# Patient Record
Sex: Female | Born: 1970 | Race: White | Hispanic: No | Marital: Married | Smoking: Former smoker
Health system: Southern US, Community
[De-identification: ages and names within clinical notes are randomized; demographics above are authoritative.]

## PROBLEM LIST (undated history)

## (undated) DIAGNOSIS — K589 Irritable bowel syndrome without diarrhea: Secondary | ICD-10-CM

## (undated) DIAGNOSIS — L309 Dermatitis, unspecified: Secondary | ICD-10-CM

## (undated) DIAGNOSIS — R519 Headache, unspecified: Secondary | ICD-10-CM

## (undated) DIAGNOSIS — R51 Headache: Secondary | ICD-10-CM

## (undated) DIAGNOSIS — G8929 Other chronic pain: Secondary | ICD-10-CM

## (undated) DIAGNOSIS — I10 Essential (primary) hypertension: Secondary | ICD-10-CM

## (undated) DIAGNOSIS — M797 Fibromyalgia: Secondary | ICD-10-CM

## (undated) HISTORY — DX: Other chronic pain: G89.29

## (undated) HISTORY — DX: Fibromyalgia: M79.7

## (undated) HISTORY — DX: Essential (primary) hypertension: I10

## (undated) HISTORY — DX: Headache, unspecified: R51.9

## (undated) HISTORY — DX: Irritable bowel syndrome, unspecified: K58.9

## (undated) HISTORY — DX: Headache: R51

## (undated) HISTORY — DX: Dermatitis, unspecified: L30.9

---

## 2005-04-20 ENCOUNTER — Ambulatory Visit (HOSPITAL_COMMUNITY): Admission: RE | Admit: 2005-04-20 | Discharge: 2005-04-20 | Payer: Self-pay | Admitting: Infectious Diseases

## 2006-05-25 ENCOUNTER — Ambulatory Visit (HOSPITAL_COMMUNITY): Admission: RE | Admit: 2006-05-25 | Discharge: 2006-05-25 | Payer: Self-pay | Admitting: Obstetrics & Gynecology

## 2006-05-28 ENCOUNTER — Inpatient Hospital Stay (HOSPITAL_COMMUNITY): Admission: AD | Admit: 2006-05-28 | Discharge: 2006-05-28 | Payer: Self-pay | Admitting: Obstetrics

## 2006-07-30 ENCOUNTER — Ambulatory Visit (HOSPITAL_COMMUNITY): Admission: RE | Admit: 2006-07-30 | Discharge: 2006-07-30 | Payer: Self-pay | Admitting: Obstetrics & Gynecology

## 2006-11-01 ENCOUNTER — Ambulatory Visit (HOSPITAL_COMMUNITY): Admission: RE | Admit: 2006-11-01 | Discharge: 2006-11-01 | Payer: Self-pay | Admitting: Obstetrics & Gynecology

## 2007-01-22 ENCOUNTER — Ambulatory Visit (HOSPITAL_COMMUNITY): Admission: RE | Admit: 2007-01-22 | Discharge: 2007-01-22 | Payer: Self-pay | Admitting: Obstetrics & Gynecology

## 2007-06-21 ENCOUNTER — Inpatient Hospital Stay (HOSPITAL_COMMUNITY): Admission: AD | Admit: 2007-06-21 | Discharge: 2007-06-23 | Payer: Self-pay | Admitting: Obstetrics & Gynecology

## 2009-12-21 LAB — HM PAP SMEAR: HM Pap smear: NORMAL

## 2010-12-19 ENCOUNTER — Encounter: Payer: Self-pay | Admitting: Family Medicine

## 2011-02-09 LAB — CBC
HCT: 36.3
HCT: 41.1
Hemoglobin: 12.5
Hemoglobin: 14.1
MCHC: 34.3
MCHC: 34.4
MCV: 87.9
MCV: 88.3
Platelets: 195
Platelets: 225
RBC: 4.11
RBC: 4.68
RDW: 13.7
RDW: 13.7
WBC: 15.4 — ABNORMAL HIGH
WBC: 20.8 — ABNORMAL HIGH

## 2011-02-09 LAB — RPR: RPR Ser Ql: NONREACTIVE

## 2012-04-10 ENCOUNTER — Encounter: Payer: Self-pay | Admitting: Family Medicine

## 2012-07-06 ENCOUNTER — Other Ambulatory Visit: Payer: Self-pay

## 2012-11-21 ENCOUNTER — Encounter: Payer: Self-pay | Admitting: Family Medicine

## 2013-02-12 ENCOUNTER — Encounter: Payer: Self-pay | Admitting: Family Medicine

## 2013-02-13 ENCOUNTER — Other Ambulatory Visit: Payer: Self-pay | Admitting: Family Medicine

## 2013-02-13 MED ORDER — SUMATRIPTAN-NAPROXEN SODIUM 85-500 MG PO TABS: 1.0000 | ORAL_TABLET | ORAL | Status: DC | PRN

## 2013-02-13 MED ORDER — METHOCARBAMOL 750 MG PO TABS: 750.0000 mg | ORAL_TABLET | Freq: Three times a day (TID) | ORAL | Status: DC

## 2013-02-13 NOTE — Telephone Encounter (Signed)
.  Rx Refilled  - OK's by [facility]

## 2013-03-07 ENCOUNTER — Encounter: Payer: Self-pay | Admitting: Family Medicine

## 2013-03-27 ENCOUNTER — Other Ambulatory Visit: Payer: Self-pay

## 2013-04-08 ENCOUNTER — Encounter: Payer: Self-pay | Admitting: Family Medicine

## 2013-04-09 ENCOUNTER — Other Ambulatory Visit: Payer: Self-pay | Admitting: Family Medicine

## 2013-04-09 MED ORDER — TRAMADOL HCL 50 MG PO TABS: 50.0000 mg | ORAL_TABLET | Freq: Four times a day (QID) | ORAL | Status: DC | PRN

## 2013-04-09 NOTE — Telephone Encounter (Signed)
rx was printed and faxed to pharmacy 

## 2013-04-29 ENCOUNTER — Other Ambulatory Visit: Payer: 59

## 2013-04-29 DIAGNOSIS — Z79899 Other long term (current) drug therapy: Secondary | ICD-10-CM

## 2013-04-29 DIAGNOSIS — Z0000 Encounter for general adult medical examination without abnormal findings: Secondary | ICD-10-CM

## 2013-04-29 LAB — CBC WITH DIFFERENTIAL/PLATELET
Basophils Relative: 1 % (ref 0–1)
Eosinophils Absolute: 0.2 10*3/uL (ref 0.0–0.7)
Eosinophils Relative: 2 % (ref 0–5)
HCT: 39.7 % (ref 36.0–46.0)
Hemoglobin: 13.6 g/dL (ref 12.0–15.0)
Lymphocytes Relative: 38 % (ref 12–46)
Lymphs Abs: 3.4 10*3/uL (ref 0.7–4.0)
MCH: 29.2 pg (ref 26.0–34.0)
MCHC: 34.3 g/dL (ref 30.0–36.0)
Monocytes Absolute: 0.5 10*3/uL (ref 0.1–1.0)
Monocytes Relative: 6 % (ref 3–12)
Neutrophils Relative %: 53 % (ref 43–77)
RBC: 4.65 MIL/uL (ref 3.87–5.11)
WBC: 8.8 10*3/uL (ref 4.0–10.5)

## 2013-04-29 LAB — COMPLETE METABOLIC PANEL WITH GFR
AST: 20 U/L (ref 0–37)
Alkaline Phosphatase: 43 U/L (ref 39–117)
BUN: 14 mg/dL (ref 6–23)
Calcium: 9.2 mg/dL (ref 8.4–10.5)
Chloride: 106 mEq/L (ref 96–112)
Creat: 0.75 mg/dL (ref 0.50–1.10)
GFR, Est African American: >89 mL/min
GFR, Est Non African American: >89 mL/min
Glucose, Bld: 88 mg/dL (ref 70–99)
Potassium: 4.9 mEq/L (ref 3.5–5.3)
Sodium: 141 mEq/L (ref 135–145)
Total Bilirubin: 0.5 mg/dL (ref 0.3–1.2)
Total Protein: 6.4 g/dL (ref 6.0–8.3)

## 2013-04-29 LAB — LIPID PANEL
HDL: 59 mg/dL (ref >39)
LDL Cholesterol: 50 mg/dL (ref 0–99)
Triglycerides: 108 mg/dL (ref <150)
VLDL: 22 mg/dL (ref 0–40)

## 2013-04-30 LAB — VITAMIN D 25 HYDROXY (VIT D DEFICIENCY, FRACTURES): Vit D, 25-Hydroxy: 43 ng/mL (ref 30–89)

## 2013-05-05 ENCOUNTER — Encounter: Payer: Self-pay | Admitting: Family Medicine

## 2013-05-06 ENCOUNTER — Encounter: Payer: Self-pay | Admitting: Family Medicine

## 2013-05-06 ENCOUNTER — Ambulatory Visit (INDEPENDENT_AMBULATORY_CARE_PROVIDER_SITE_OTHER): Payer: 59 | Admitting: Family Medicine

## 2013-05-06 VITALS — BP 130/90 | HR 92 | Temp 98.4°F | Resp 16 | Ht 62.0 in | Wt <= 1120 oz

## 2013-05-06 DIAGNOSIS — I10 Essential (primary) hypertension: Secondary | ICD-10-CM | POA: Insufficient documentation

## 2013-05-06 DIAGNOSIS — Z0000 Encounter for general adult medical examination without abnormal findings: Secondary | ICD-10-CM

## 2013-05-06 MED ORDER — METOPROLOL SUCCINATE ER 25 MG PO TB24: 25.0000 mg | ORAL_TABLET | Freq: Every day | ORAL | Status: DC

## 2013-05-06 NOTE — Progress Notes (Signed)
Subjective:    Patient ID: Karen Schwartz, female    DOB: 12-03-1970, 42 y.o.   MRN: [id number]  HPI  Patient is a very pleasant 42 year old white female who comes in today for complete physical exam. She sees a gynecologist who performed her Pap, pelvic exam, and mammogram. She had a mammogram performed last year and it was normal. Her Pap smear is up-to-date and is normal. Her blood pressure is elevated today at 130/90. Heart rate is elevated at 92-100. She drinks 5 cups of coffee per day. She requires a caffeine due to chronic fatigue stemming from her fibromyalgia. She is using tramadol 1-2 pills a day for chronic pain due to fibromyalgia. 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 It also allows her to perform her activities of daily living. She has failed Cymbalta and Lyrica in the past. Otherwise she is doing well. She got her flu vaccine at work. Her tetanus shot was given in 2013 and is up to date. Her most recent labwork as listed below: Lab on 04/29/2013  Component Date Value Range Status  . Cholesterol 04/29/2013 131  0 - 200 mg/dL Final   Comment: ATP III Classification:                                < 200        mg/dL        Desirable                               200 - 239     mg/dL        Borderline High                               >= 240        mg/dL        High                             . Triglycerides 04/29/2013 108  <150 mg/dL Final  . HDL 95/28/4132 59  >39 mg/dL Final  . Total CHOL/HDL Ratio 04/29/2013 2.2   Final  . VLDL 04/29/2013 22  0 - 40 mg/dL Final  . LDL Cholesterol 04/29/2013 50  0 - 99 mg/dL Final   Comment:                            Total Cholesterol/HDL Ratio:CHD Risk                                                 Coronary Heart Disease Risk Table                                                                 Men       Women  1/2 Average Risk              3.4        3.3                   Average Risk              5.0        4.4                                    2X Average Risk              9.6        7.1                                    3X Average Risk             23.4       11.0                          Use the calculated Patient Ratio above and the CHD Risk table                           to determine the patient's CHD Risk.                          ATP III Classification (LDL):                                < 100        mg/dL         Optimal                               100 - 129     mg/dL         Near or Above Optimal                               130 - 159     mg/dL         Borderline High                               160 - 189     mg/dL         High                                > 190        mg/dL         Very High                             . TSH 04/29/2013 3.452  0.350 - 4.500 uIU/mL Final  . WBC 04/29/2013 8.8  4.0 - 10.5 K/uL Final  . RBC 04/29/2013 4.65  3.87 - 5.11 MIL/uL Final  . Hemoglobin 04/29/2013 13.6  12.0 - 15.0 g/dL Final  .  HCT 04/29/2013 39.7  36.0 - 46.0 % Final  . MCV 04/29/2013 85.4  78.0 - 100.0 fL Final  . MCH 04/29/2013 29.2  26.0 - 34.0 pg Final  . MCHC 04/29/2013 34.3  30.0 - 36.0 g/dL Final  . RDW 45/40/9811 13.8  11.5 - 15.5 % Final  . Platelets 04/29/2013 270  150 - 400 K/uL Final  . Neutrophils Relative % 04/29/2013 53  43 - 77 % Final  . Neutro Abs 04/29/2013 4.7  1.7 - 7.7 K/uL Final  . Lymphocytes Relative 04/29/2013 38  12 - 46 % Final  . Lymphs Abs 04/29/2013 3.4  0.7 - 4.0 K/uL Final  . Monocytes Relative 04/29/2013 6  3 - 12 % Final  . Monocytes Absolute 04/29/2013 0.5  0.1 - 1.0 K/uL Final  . Eosinophils Relative 04/29/2013 2  0 - 5 % Final  . Eosinophils Absolute 04/29/2013 0.2  0.0 - 0.7 K/uL Final  . Basophils Relative 04/29/2013 1  0 - 1 % Final  . Basophils Absolute 04/29/2013 0.1  0.0 - 0.1 K/uL Final  . Smear Review 04/29/2013 Criteria for review not met   Final  . Sodium 04/29/2013 141  135 -  145 mEq/L Final  . Potassium 04/29/2013 4.9  3.5 - 5.3 mEq/L Final  . Chloride 04/29/2013 106  96 - 112 mEq/L Final  . CO2 04/29/2013 29  19 - 32 mEq/L Final  . Glucose, Bld 04/29/2013 88  70 - 99 mg/dL Final  . BUN 91/47/8295 14  6 - 23 mg/dL Final  . Creat 62/13/0865 0.75  0.50 - 1.10 mg/dL Final  . Total Bilirubin 04/29/2013 0.5  0.3 - 1.2 mg/dL Final  . Alkaline Phosphatase 04/29/2013 43  39 - 117 U/L Final  . AST 04/29/2013 20  0 - 37 U/L Final  . ALT 04/29/2013 14  0 - 35 U/L Final  . Total Protein 04/29/2013 6.4  6.0 - 8.3 g/dL Final  . Albumin 78/46/9629 4.0  3.5 - 5.2 g/dL Final  . Calcium 52/84/1324 9.2  8.4 - 10.5 mg/dL Final  . GFR, Est African American 04/29/2013 >89   Final  . GFR, Est Non African American 04/29/2013 >89   Final   Comment:                            The estimated GFR is a calculation valid for adults (>=75 years old)                          that uses the CKD-EPI algorithm to adjust for age and sex. It is                            not to be used for children, pregnant women, hospitalized patients,                             patients on dialysis, or with rapidly changing kidney function.                          According to the NKDEP, eGFR >89 is normal, 60-89 shows mild                          impairment, 30-59 shows moderate impairment,  15-29 shows severe                          impairment and <15 is ESRD.                             Marland Kitchen Vit D, 25-Hydroxy 04/29/2013 43  30 - 89 ng/mL Final   Comment: This assay accurately quantifies Vitamin D, which is the sum of the                          25-Hydroxy forms of Vitamin D2 and D3.  Studies have shown that the                          optimum concentration of 25-Hydroxy Vitamin D is 30 ng/mL or higher.                           Concentrations of Vitamin D between 20 and 29 ng/mL are considered to                          be insufficient and concentrations less than 20 ng/mL are considered                           to be deficient for Vitamin D.   Past Medical History  Diagnosis Date  . Fibromyalgia   . Eczema   . Constipation   . Chronic headaches   . Hypertension     boderline   Current Outpatient Prescriptions on File Prior to Visit  Medication Sig Dispense Refill  . amitriptyline (ELAVIL) 25 MG tablet Take 25 mg by mouth at bedtime.        . methocarbamol (ROBAXIN) 750 MG tablet Take 1 tablet (750 mg total) by mouth 3 (three) times daily.  90 tablet  0  . SUMAtriptan-naproxen (TREXIMET) 85-500 MG per tablet Take 1 tablet by mouth every 2 (two) hours as needed.  10 tablet  3  . traMADol (ULTRAM) 50 MG tablet Take 1 tablet (50 mg total) by mouth every 6 (six) hours as needed.  90 tablet  2   No current facility-administered medications on file prior to visit.   Allergies  Allergen Reactions  . Ampicillin   . Latex Itching  . Lyrica [Pregabalin]    History   Social History  . Marital Status: Married    Spouse Name: N/A    Number of Children: N/A  . Years of Education: N/A   Occupational History  . Not on file.   Social History Main Topics  . Smoking status: Never Smoker   . Smokeless tobacco: Not on file  . Alcohol Use: No  . Drug Use: No  . Sexual Activity: Not on file     Comment: divorced, remarried, 2 kids, RN   Other Topics Concern  . Not on file   Social History Narrative  . No narrative on file   Family History  Problem Relation Age of Onset  . Hypertension Mother   . Depression Mother   . Cancer Father     bladder cancer, rectal cancer  . Hypertension Sister   . Depression Sister   . Mental illness Brother     suicide  .  Alcohol abuse Brother   . Diabetes Maternal Grandmother   . Macular degeneration Maternal Grandmother   . Dementia Maternal Grandfather      Review of Systems  All other systems reviewed and are negative.       Objective:   Physical Exam  Vitals reviewed. Constitutional: She is oriented to person, place, and time.  She appears well-developed and well-nourished. No distress.  HENT:  Head: Normocephalic and atraumatic.  Right Ear: External ear normal.  Left Ear: External ear normal.  Nose: Nose normal.  Mouth/Throat: Oropharynx is clear and moist. No oropharyngeal exudate.  Eyes: Conjunctivae and EOM are normal. Pupils are equal, round, and reactive to light. Right eye exhibits no discharge. Left eye exhibits no discharge. No scleral icterus.  Neck: Normal range of motion. Neck supple. No JVD present. No tracheal deviation present. No thyromegaly present.  Cardiovascular: Normal rate, regular rhythm, normal heart sounds and intact distal pulses.  Exam reveals no gallop and no friction rub.   No murmur heard. Pulmonary/Chest: Effort normal and breath sounds normal. No stridor. No respiratory distress. She has no wheezes. She has no rales. She exhibits no tenderness.  Abdominal: Soft. Bowel sounds are normal. She exhibits no distension. There is no tenderness. There is no rebound and no guarding.  Musculoskeletal: Normal range of motion. She exhibits no edema and no tenderness.  Lymphadenopathy:    She has no cervical adenopathy.  Neurological: She is alert and oriented to person, place, and time. She displays normal reflexes. No cranial nerve deficit. She exhibits normal muscle tone. Coordination normal.  Skin: Skin is warm. No rash noted. She is not diaphoretic. No erythema. No pallor.  Psychiatric: She has a normal mood and affect. Her behavior is normal. Judgment and thought content normal.          Assessment & Plan:  1. Routine general medical examination at a health care facility Patient's lab work is excellent. Her exam is completely normal.  Her preventive care is up to date. Her immunizations are up-to-date. I did recommend increasing aerobic activity to try to help her fatigue. Continue to prescribe tramadol 60 tablets per month as needed to control her pain to allow her to continue to perform  activities of daily living, work, and exercise. 2. HTN (hypertension) Begin Toprol-XL 25 mg by mouth daily for hypertension and tachycardia. Recheck blood pressure and heart rate of 1 month. - metoprolol succinate (TOPROL-XL) 25 MG 24 hr tablet; Take 1 tablet (25 mg total) by mouth daily.  Dispense: 90 tablet; Refill: 3

## 2013-05-13 ENCOUNTER — Telehealth: Payer: Self-pay | Admitting: Family Medicine

## 2013-05-13 MED ORDER — LINACLOTIDE 145 MCG PO CAPS: 145.0000 ug | ORAL_CAPSULE | Freq: Every day | ORAL | Status: DC

## 2013-05-13 NOTE — Telephone Encounter (Signed)
Medication refilled per protocol. 

## 2013-05-19 ENCOUNTER — Telehealth: Payer: Self-pay | Admitting: Family Medicine

## 2013-05-19 MED ORDER — LINACLOTIDE 145 MCG PO CAPS: 145.0000 ug | ORAL_CAPSULE | Freq: Every day | ORAL | Status: DC

## 2013-05-19 NOTE — Telephone Encounter (Signed)
Medication refilled per protocol. 

## 2013-05-20 ENCOUNTER — Telehealth: Payer: Self-pay | Admitting: Family Medicine

## 2013-05-20 NOTE — Telephone Encounter (Signed)
This was refilled at [facility] 12/29 per patient request  Linzess

## 2013-05-23 ENCOUNTER — Other Ambulatory Visit: Payer: Self-pay | Admitting: Family Medicine

## 2013-05-23 ENCOUNTER — Encounter: Payer: Self-pay | Admitting: Family Medicine

## 2013-05-23 MED ORDER — LINACLOTIDE 145 MCG PO CAPS: 145.0000 ug | ORAL_CAPSULE | Freq: Every day | ORAL | Status: DC

## 2013-05-23 MED ORDER — LINACLOTIDE 145 MCG PO CAPS
145.0000 ug | ORAL_CAPSULE | Freq: Every day | ORAL | Status: DC
Start: 2013-05-23 — End: 2013-05-23

## 2013-06-16 ENCOUNTER — Other Ambulatory Visit: Payer: Self-pay | Admitting: Family Medicine

## 2013-06-16 MED ORDER — AMITRIPTYLINE HCL 25 MG PO TABS: 25.0000 mg | ORAL_TABLET | Freq: Every day | ORAL | Status: DC

## 2013-06-16 NOTE — Telephone Encounter (Signed)
Rx Refilled  

## 2013-06-27 ENCOUNTER — Other Ambulatory Visit: Payer: Self-pay | Admitting: Family Medicine

## 2013-06-27 MED ORDER — LINACLOTIDE 145 MCG PO CAPS: 145.0000 ug | ORAL_CAPSULE | Freq: Every day | ORAL | Status: DC

## 2013-06-27 NOTE — Telephone Encounter (Signed)
Rx Refilled for 90 day supply 

## 2013-07-12 ENCOUNTER — Encounter: Payer: Self-pay | Admitting: Family Medicine

## 2013-07-14 ENCOUNTER — Other Ambulatory Visit: Payer: Self-pay | Admitting: *Deleted

## 2013-07-14 MED ORDER — VALACYCLOVIR HCL 500 MG PO TABS: 500.0000 mg | ORAL_TABLET | Freq: Every day | ORAL | Status: DC

## 2013-07-14 MED ORDER — AMITRIPTYLINE HCL 25 MG PO TABS: 25.0000 mg | ORAL_TABLET | Freq: Every day | ORAL | Status: DC

## 2013-07-14 NOTE — Telephone Encounter (Signed)
Refill appropriate and filled per protocol. 

## 2013-08-11 ENCOUNTER — Other Ambulatory Visit: Payer: Self-pay | Admitting: Family Medicine

## 2013-08-11 NOTE — Telephone Encounter (Signed)
Last Rf 11/19 #90 + 2.  Last OV 05/06/13 CPE  OK refill?

## 2013-08-11 NOTE — Telephone Encounter (Signed)
rx fax to pharmacy 

## 2013-10-20 ENCOUNTER — Encounter: Payer: Self-pay | Admitting: Family Medicine

## 2013-10-20 ENCOUNTER — Ambulatory Visit (INDEPENDENT_AMBULATORY_CARE_PROVIDER_SITE_OTHER): Payer: 59 | Admitting: Family Medicine

## 2013-10-20 VITALS — BP 100/72 | HR 78 | Temp 97.2°F | Resp 14 | Ht 62.0 in | Wt 127.0 lb

## 2013-10-20 DIAGNOSIS — K529 Noninfective gastroenteritis and colitis, unspecified: Secondary | ICD-10-CM

## 2013-10-20 DIAGNOSIS — K5289 Other specified noninfective gastroenteritis and colitis: Secondary | ICD-10-CM

## 2013-10-20 MED ORDER — SACCHAROMYCES BOULARDII 250 MG PO CAPS: 250.0000 mg | ORAL_CAPSULE | Freq: Two times a day (BID) | ORAL | Status: DC

## 2013-10-20 MED ORDER — METRONIDAZOLE 500 MG PO TABS: 500.0000 mg | ORAL_TABLET | Freq: Three times a day (TID) | ORAL | Status: DC

## 2013-10-20 NOTE — Addendum Note (Signed)
Addended by: Sharmon Revere on: 10/20/2013 03:59 PM   Modules accepted: Orders

## 2013-10-20 NOTE — Progress Notes (Signed)
Subjective:    Patient ID: Karen Schwartz, female    DOB: December 20, 1970, 43 y.o.   MRN: [id number]  HPI Patient recently underwent dental surgery. She was placed on clindamycin. She discontinued the clindamycin on May 17. One week after she has developed severe watery diarrhea. Over the last 3 days, she reports having at least 25 watery bowel movements with intense intestinal spasms and cramping abdominal pain. She denies any fevers or chills. She denies been on hematochezia. She denies any travel. She has not been outside the country. She denies any known sick contacts. She does work as a Marine scientist in the hospital although she has not been around C.Diff to her knowledge. Past Medical History  Diagnosis Date  . Fibromyalgia   . Eczema   . Constipation   . Chronic headaches   . Hypertension     boderline   Current Outpatient Prescriptions on File Prior to Visit  Medication Sig Dispense Refill  . amitriptyline (ELAVIL) 25 MG tablet Take 1 tablet (25 mg total) by mouth at bedtime.  90 tablet  1  . Ergocalciferol (VITAMIN D2) 2000 UNITS TABS Take by mouth.      . Linaclotide (LINZESS) 145 MCG CAPS capsule Take 1 capsule (145 mcg total) by mouth daily.  90 capsule  4  . methocarbamol (ROBAXIN) 750 MG tablet Take 1 tablet (750 mg total) by mouth 3 (three) times daily.  90 tablet  0  . metoprolol succinate (TOPROL-XL) 25 MG 24 hr tablet Take 1 tablet (25 mg total) by mouth daily.  90 tablet  3  . SUMAtriptan-naproxen (TREXIMET) 85-500 MG per tablet Take 1 tablet by mouth every 2 (two) hours as needed.  10 tablet  3  . traMADol (ULTRAM) 50 MG tablet TAKE 1 TABLET BY MOUTH EVERY 6 HOURS AS NEEDED  90 tablet  PRN  . valACYclovir (VALTREX) 500 MG tablet Take 1 tablet (500 mg total) by mouth daily.  90 tablet  1   No current facility-administered medications on file prior to visit.   No past surgical history on file. Allergies  Allergen Reactions  . Ampicillin   . Latex Itching  . Lyrica  [Pregabalin]       Review of Systems  All other systems reviewed and are negative.      Objective:   Physical Exam  Vitals reviewed. Cardiovascular: Normal rate and regular rhythm.   Pulmonary/Chest: Effort normal and breath sounds normal.  Abdominal: Soft. Bowel sounds are normal. She exhibits no distension. There is no tenderness.          Assessment & Plan:  1. Colitis Given the recent use of clindamycin, and her work in the hospital, I am concerned about C. difficile colitis.  I will check stool cultures, stool O&P, and C. difficile assay.  Afterwards, begin Flagyl 500 mg by mouth 3 times a day for 10 days and florastor 250 mg by mouth twice a day for 10 days.  I recommended a BRAT diet and push gatorade.  I recommended she stay out of work for at least 48 hours until her symptoms begin to improve and some of the stool studies are returning. - metroNIDAZOLE (FLAGYL) 500 MG tablet; Take 1 tablet (500 mg total) by mouth 3 (three) times daily.  Dispense: 30 tablet; Refill: 0 - saccharomyces boulardii (FLORASTOR) 250 MG capsule; Take 1 capsule (250 mg total) by mouth 2 (two) times daily.  Dispense: 30 capsule; Refill: 0 - Stool culture; Future - Ova and  parasite examination - Clostridium Difficile by PCR

## 2013-10-21 LAB — OVA AND PARASITE EXAMINATION: OP: NONE SEEN

## 2013-10-21 LAB — CLOSTRIDIUM DIFFICILE BY PCR: CDIFFPCR: DETECTED — AB

## 2013-10-24 LAB — STOOL CULTURE

## 2013-10-27 ENCOUNTER — Encounter: Payer: Self-pay | Admitting: Family Medicine

## 2014-01-07 ENCOUNTER — Other Ambulatory Visit: Payer: Self-pay | Admitting: Family Medicine

## 2014-02-16 ENCOUNTER — Encounter: Payer: Self-pay | Admitting: Family Medicine

## 2014-02-16 MED ORDER — TRAMADOL HCL 50 MG PO TABS: ORAL_TABLET | ORAL | Status: DC

## 2014-02-16 MED ORDER — AMITRIPTYLINE HCL 25 MG PO TABS: 25.0000 mg | ORAL_TABLET | Freq: Every day | ORAL | Status: DC

## 2014-02-16 NOTE — Telephone Encounter (Signed)
Medication called to pharmacy. 

## 2014-03-05 ENCOUNTER — Other Ambulatory Visit: Payer: Self-pay | Admitting: Family Medicine

## 2014-03-05 NOTE — Telephone Encounter (Signed)
Prescription sent to pharmacy.

## 2014-03-05 NOTE — Telephone Encounter (Signed)
Patient last refill of Treximet 02/13/2013.  No Imitrex on medication list.   MD please advise.

## 2014-03-05 NOTE — Telephone Encounter (Signed)
ok 

## 2014-03-18 ENCOUNTER — Other Ambulatory Visit: Payer: Self-pay | Admitting: Family Medicine

## 2014-03-18 NOTE — Telephone Encounter (Signed)
?   OK to Refill  

## 2014-03-19 NOTE — Telephone Encounter (Signed)
Medication called to pharmacy. 

## 2014-03-19 NOTE — Telephone Encounter (Signed)
ok 

## 2014-06-08 ENCOUNTER — Other Ambulatory Visit: Payer: Self-pay | Admitting: Family Medicine

## 2014-06-08 NOTE — Telephone Encounter (Signed)
Refill appropriate and filled per protocol. 

## 2014-07-06 ENCOUNTER — Other Ambulatory Visit: Payer: Self-pay | Admitting: Family Medicine

## 2014-07-07 ENCOUNTER — Telehealth: Payer: Self-pay | Admitting: Family Medicine

## 2014-07-07 DIAGNOSIS — Z0000 Encounter for general adult medical examination without abnormal findings: Secondary | ICD-10-CM

## 2014-07-07 NOTE — Telephone Encounter (Signed)
Labs have been ordered

## 2014-07-07 NOTE — Telephone Encounter (Signed)
[phone] PT is coming in next week for a CPE an she is coming in next week before he CPE to have lab work done and she is also wanting to have her Vitamin D checked as well. And she wanted me to send yall a message so that could be ordered as well.

## 2014-07-08 ENCOUNTER — Telehealth: Payer: Self-pay | Admitting: Family Medicine

## 2014-07-08 NOTE — Telephone Encounter (Signed)
Requesting a refill on Tramadol - ? OK to Refill

## 2014-07-09 ENCOUNTER — Encounter: Payer: Self-pay | Admitting: Family Medicine

## 2014-07-09 MED ORDER — TRAMADOL HCL 50 MG PO TABS: 50.0000 mg | ORAL_TABLET | Freq: Four times a day (QID) | ORAL | Status: DC | PRN

## 2014-07-09 NOTE — Telephone Encounter (Signed)
ok 

## 2014-07-09 NOTE — Telephone Encounter (Signed)
Rx printed, signed and faxed to pharm

## 2014-07-13 ENCOUNTER — Other Ambulatory Visit: Payer: 59

## 2014-07-13 DIAGNOSIS — E559 Vitamin D deficiency, unspecified: Secondary | ICD-10-CM

## 2014-07-13 DIAGNOSIS — I10 Essential (primary) hypertension: Secondary | ICD-10-CM

## 2014-07-13 DIAGNOSIS — Z0000 Encounter for general adult medical examination without abnormal findings: Secondary | ICD-10-CM

## 2014-07-13 DIAGNOSIS — Z79899 Other long term (current) drug therapy: Secondary | ICD-10-CM

## 2014-07-13 LAB — CBC WITH DIFFERENTIAL/PLATELET
Basophils Absolute: 0.1 10*3/uL (ref 0.0–0.1)
Basophils Relative: 1 % (ref 0–1)
EOS ABS: 0.4 10*3/uL (ref 0.0–0.7)
EOS PCT: 4 % (ref 0–5)
HCT: 41.1 % (ref 36.0–46.0)
Hemoglobin: 13.9 g/dL (ref 12.0–15.0)
Lymphocytes Relative: 41 % (ref 12–46)
Lymphs Abs: 3.6 10*3/uL (ref 0.7–4.0)
MCH: 29.7 pg (ref 26.0–34.0)
MCHC: 33.8 g/dL (ref 30.0–36.0)
MCV: 87.8 fL (ref 78.0–100.0)
MPV: 10.5 fL (ref 8.6–12.4)
Monocytes Absolute: 0.5 10*3/uL (ref 0.1–1.0)
Monocytes Relative: 6 % (ref 3–12)
NEUTROS ABS: 4.2 10*3/uL (ref 1.7–7.7)
NEUTROS PCT: 48 % (ref 43–77)
PLATELETS: 246 10*3/uL (ref 150–400)
RBC: 4.68 MIL/uL (ref 3.87–5.11)
RDW: 13.4 % (ref 11.5–15.5)
WBC: 8.8 10*3/uL (ref 4.0–10.5)

## 2014-07-13 LAB — LIPID PANEL
Cholesterol: 144 mg/dL (ref 0–200)
HDL: 60 mg/dL (ref >=46)
LDL Cholesterol: 62 mg/dL (ref 0–99)
Total CHOL/HDL Ratio: 2.4 Ratio
Triglycerides: 109 mg/dL (ref <150)
VLDL: 22 mg/dL (ref 0–40)

## 2014-07-13 LAB — COMPREHENSIVE METABOLIC PANEL
ALBUMIN: 3.9 g/dL (ref 3.5–5.2)
ALK PHOS: 47 U/L (ref 39–117)
ALT: 30 U/L (ref 0–35)
AST: 26 U/L (ref 0–37)
BILIRUBIN TOTAL: 0.5 mg/dL (ref 0.2–1.2)
BUN: 12 mg/dL (ref 6–23)
CO2: 22 mEq/L (ref 19–32)
Calcium: 8.9 mg/dL (ref 8.4–10.5)
Chloride: 105 mEq/L (ref 96–112)
Creat: 0.76 mg/dL (ref 0.50–1.10)
Glucose, Bld: 89 mg/dL (ref 70–99)
POTASSIUM: 4.3 meq/L (ref 3.5–5.3)
Sodium: 138 mEq/L (ref 135–145)
Total Protein: 6 g/dL (ref 6.0–8.3)

## 2014-07-13 LAB — TSH: TSH: 3.541 u[IU]/mL (ref 0.350–4.500)

## 2014-07-14 LAB — VITAMIN D 25 HYDROXY (VIT D DEFICIENCY, FRACTURES): Vit D, 25-Hydroxy: 31 ng/mL (ref 30–100)

## 2014-07-17 ENCOUNTER — Encounter: Payer: Self-pay | Admitting: Family Medicine

## 2014-07-17 ENCOUNTER — Ambulatory Visit (INDEPENDENT_AMBULATORY_CARE_PROVIDER_SITE_OTHER): Payer: 59 | Admitting: Family Medicine

## 2014-07-17 VITALS — BP 100/60 | HR 100 | Temp 98.5°F | Resp 16 | Ht 62.0 in | Wt 135.0 lb

## 2014-07-17 DIAGNOSIS — K589 Irritable bowel syndrome without diarrhea: Secondary | ICD-10-CM

## 2014-07-17 DIAGNOSIS — Z0000 Encounter for general adult medical examination without abnormal findings: Secondary | ICD-10-CM

## 2014-07-17 MED ORDER — POLYETHYLENE GLYCOL 3350 17 GM/SCOOP PO POWD: 17.0000 g | Freq: Two times a day (BID) | ORAL | Status: DC | PRN

## 2014-07-17 NOTE — Progress Notes (Signed)
Subjective:    Patient ID: Karen Schwartz, female    DOB: [birth year], 44 y.o.   MRN: [id number]  HPI Patient is here today for complete physical exam. She denies any concerns. She discontinued her linzess that she was taking for chronic constipation. She is now treating this herself with a probiotic and MiraLAX as needed. This seems to be working better. Her mammogram is due later this year. Her Pap smear was done last year and was normal. Her most recent lab work as listed below: Lab on 07/13/2014  Component Date Value Ref Range Status  . WBC 07/13/2014 8.8  4.0 - 10.5 K/uL Final  . RBC 07/13/2014 4.68  3.87 - 5.11 MIL/uL Final  . Hemoglobin 07/13/2014 13.9  12.0 - 15.0 g/dL Final  . HCT 07/13/2014 41.1  36.0 - 46.0 % Final  . MCV 07/13/2014 87.8  78.0 - 100.0 fL Final  . MCH 07/13/2014 29.7  26.0 - 34.0 pg Final  . MCHC 07/13/2014 33.8  30.0 - 36.0 g/dL Final  . RDW 07/13/2014 13.4  11.5 - 15.5 % Final  . Platelets 07/13/2014 246  150 - 400 K/uL Final  . MPV 07/13/2014 10.5  8.6 - 12.4 fL Final  . Neutrophils Relative % 07/13/2014 48  43 - 77 % Final  . Neutro Abs 07/13/2014 4.2  1.7 - 7.7 K/uL Final  . Lymphocytes Relative 07/13/2014 41  12 - 46 % Final  . Lymphs Abs 07/13/2014 3.6  0.7 - 4.0 K/uL Final  . Monocytes Relative 07/13/2014 6  3 - 12 % Final  . Monocytes Absolute 07/13/2014 0.5  0.1 - 1.0 K/uL Final  . Eosinophils Relative 07/13/2014 4  0 - 5 % Final  . Eosinophils Absolute 07/13/2014 0.4  0.0 - 0.7 K/uL Final  . Basophils Relative 07/13/2014 1  0 - 1 % Final  . Basophils Absolute 07/13/2014 0.1  0.0 - 0.1 K/uL Final  . Smear Review 07/13/2014 Criteria for review not met   Final  . Sodium 07/13/2014 138  135 - 145 mEq/L Final  . Potassium 07/13/2014 4.3  3.5 - 5.3 mEq/L Final  . Chloride 07/13/2014 105  96 - 112 mEq/L Final  . CO2 07/13/2014 22  19 - 32 mEq/L Final  . Glucose, Bld 07/13/2014 89  70 - 99 mg/dL Final  . BUN 07/13/2014 12  6 - 23 mg/dL Final  . Creat  07/13/2014 0.76  0.50 - 1.10 mg/dL Final  . Total Bilirubin 07/13/2014 0.5  0.2 - 1.2 mg/dL Final  . Alkaline Phosphatase 07/13/2014 47  39 - 117 U/L Final  . AST 07/13/2014 26  0 - 37 U/L Final  . ALT 07/13/2014 30  0 - 35 U/L Final  . Total Protein 07/13/2014 6.0  6.0 - 8.3 g/dL Final  . Albumin 07/13/2014 3.9  3.5 - 5.2 g/dL Final  . Calcium 07/13/2014 8.9  8.4 - 10.5 mg/dL Final  . Cholesterol 07/13/2014 144  0 - 200 mg/dL Final   Comment: ATP III Classification:       < 200        mg/dL        Desirable      200 - 239     mg/dL        Borderline High      >= 240        mg/dL        High     . Triglycerides 07/13/2014 109  <150  mg/dL Final  . HDL 07/13/2014 60  >=46 mg/dL Final   ** Please note change in reference range(s). **  . Total CHOL/HDL Ratio 07/13/2014 2.4   Final  . VLDL 07/13/2014 22  0 - 40 mg/dL Final  . LDL Cholesterol 07/13/2014 62  0 - 99 mg/dL Final   Comment:   Total Cholesterol/HDL Ratio:CHD Risk                        Coronary Heart Disease Risk Table                                        Men       Women          1/2 Average Risk              3.4        3.3              Average Risk              5.0        4.4           2X Average Risk              9.6        7.1           3X Average Risk             23.4       11.0 Use the calculated Patient Ratio above and the CHD Risk table  to determine the patient's CHD Risk. ATP III Classification (LDL):       < 100        mg/dL         Optimal      100 - 129     mg/dL         Near or Above Optimal      130 - 159     mg/dL         Borderline High      160 - 189     mg/dL         High       > 190        mg/dL         Very High     . TSH 07/13/2014 3.541  0.350 - 4.500 uIU/mL Final  . Vit D, 25-Hydroxy 07/13/2014 31  30 - 100 ng/mL Final   Comment: Vitamin D Status           25-OH Vitamin D        Deficiency                <20 ng/mL        Insufficiency         20 - 29 ng/mL        Optimal             > or = 30  ng/mL   For 25-OH Vitamin D testing on patients on D2-supplementation and patients for whom quantitation of D2 and D3 fractions is required, the QuestAssureD 25-OH VIT D, (D2,D3), LC/MS/MS is recommended: order code [id number] (patients > 2 yrs).    Past Medical History  Diagnosis Date  . Fibromyalgia   . Eczema   . Constipation   . Chronic  headaches   . Hypertension     boderline   No past surgical history on file. Current Outpatient Prescriptions on File Prior to Visit  Medication Sig Dispense Refill  . amitriptyline (ELAVIL) 25 MG tablet Take 1 tablet (25 mg total) by mouth at bedtime. 90 tablet 1  . Ergocalciferol (VITAMIN D2) 2000 UNITS TABS Take by mouth.    . methocarbamol (ROBAXIN) 750 MG tablet TAKE 1 TABLET (750 MG TOTAL) BY MOUTH 3 (THREE) TIMES DAILY. 90 tablet 0  . metoprolol succinate (TOPROL-XL) 25 MG 24 hr tablet Take 1 tablet (25 mg total) by mouth daily. 90 tablet 3  . SUMAtriptan (IMITREX) 100 MG tablet TAKE 1 TABLET BY MOUTH EVERY 2 HOURS AS NEEDED AS DIRECTED 10 tablet 3  . traMADol (ULTRAM) 50 MG tablet Take 1 tablet (50 mg total) by mouth every 6 (six) hours as needed. for pain 120 tablet 2  . valACYclovir (VALTREX) 500 MG tablet TAKE 1 TABLET (500 MG TOTAL) BY MOUTH DAILY. 90 tablet 1   No current facility-administered medications on file prior to visit.   Allergies  Allergen Reactions  . Ampicillin   . Latex Itching  . Lyrica [Pregabalin]    History   Social History  . Marital Status: Married    Spouse Name: N/A  . Number of Children: N/A  . Years of Education: N/A   Occupational History  . Not on file.   Social History Main Topics  . Smoking status: Never Smoker   . Smokeless tobacco: Not on file  . Alcohol Use: No  . Drug Use: No  . Sexual Activity: Not on file     Comment: divorced, remarried, 2 kids, RN   Other Topics Concern  . Not on file   Social History Narrative   Family History  Problem Relation Age of Onset  . Hypertension  Mother   . Depression Mother   . Cancer Father     bladder cancer, rectal cancer  . Hypertension Sister   . Depression Sister   . Mental illness Brother     suicide  . Alcohol abuse Brother   . Diabetes Maternal Grandmother   . Macular degeneration Maternal Grandmother   . Dementia Maternal Grandfather       Review of Systems  All other systems reviewed and are negative.      Objective:   Physical Exam  Constitutional: She is oriented to person, place, and time. She appears well-developed and well-nourished. No distress.  HENT:  Head: Normocephalic and atraumatic.  Right Ear: External ear normal.  Left Ear: External ear normal.  Nose: Nose normal.  Mouth/Throat: Oropharynx is clear and moist. No oropharyngeal exudate.  Eyes: Conjunctivae and EOM are normal. Pupils are equal, round, and reactive to light. Right eye exhibits no discharge. Left eye exhibits no discharge. No scleral icterus.  Neck: Normal range of motion. Neck supple. No JVD present. No tracheal deviation present. No thyromegaly present.  Cardiovascular: Normal rate and intact distal pulses.  Exam reveals no gallop and no friction rub.   No murmur heard. Pulmonary/Chest: Effort normal and breath sounds normal. No stridor. No respiratory distress. She has no wheezes. She has no rales. She exhibits no tenderness.  Abdominal: Soft. Bowel sounds are normal. She exhibits no distension and no mass. There is no tenderness. There is no rebound and no guarding.  Musculoskeletal: Normal range of motion. She exhibits no edema or tenderness.  Lymphadenopathy:    She has no cervical adenopathy.  Neurological: She is alert and oriented to person, place, and time. She has normal reflexes. She displays normal reflexes. No cranial nerve deficit. She exhibits normal muscle tone. Coordination normal.  Skin: Skin is warm. No rash noted. She is not diaphoretic. No erythema. No pallor.  Psychiatric: She has a normal mood and affect.  Her behavior is normal. Judgment and thought content normal.  Vitals reviewed.         Assessment & Plan:  IBS (irritable bowel syndrome) - Plan: polyethylene glycol powder (GLYCOLAX/MIRALAX) powder  Routine general medical examination at a health care facility  Patient's physical exam is normal. Lab work is excellent. Blood pressure is excellent. I recommended a mammogram later this year. Pap smear is up-to-date. Follow-up in one year or as needed.

## 2014-07-27 ENCOUNTER — Other Ambulatory Visit: Payer: Self-pay | Admitting: Family Medicine

## 2014-09-14 ENCOUNTER — Other Ambulatory Visit: Payer: Self-pay | Admitting: Family Medicine

## 2014-10-07 ENCOUNTER — Other Ambulatory Visit: Payer: Self-pay | Admitting: Family Medicine

## 2014-10-07 NOTE — Telephone Encounter (Signed)
?   OK to Refill  

## 2014-10-08 NOTE — Telephone Encounter (Signed)
rx called in

## 2014-10-08 NOTE — Telephone Encounter (Signed)
ok 

## 2015-01-11 ENCOUNTER — Other Ambulatory Visit: Payer: Self-pay | Admitting: Family Medicine

## 2015-01-11 NOTE — Telephone Encounter (Signed)
ok 

## 2015-01-11 NOTE — Telephone Encounter (Signed)
?   OK to Refill  

## 2015-01-11 NOTE — Telephone Encounter (Signed)
Medication called to pharmacy. 

## 2015-03-22 ENCOUNTER — Other Ambulatory Visit: Payer: Self-pay | Admitting: Physician Assistant

## 2015-03-22 ENCOUNTER — Encounter: Payer: Self-pay | Admitting: Physician Assistant

## 2015-03-22 ENCOUNTER — Ambulatory Visit (INDEPENDENT_AMBULATORY_CARE_PROVIDER_SITE_OTHER): Payer: 59 | Admitting: Physician Assistant

## 2015-03-22 VITALS — BP 116/76 | HR 96 | Temp 98.7°F | Resp 18 | Wt 136.0 lb

## 2015-03-22 DIAGNOSIS — N939 Abnormal uterine and vaginal bleeding, unspecified: Secondary | ICD-10-CM | POA: Diagnosis not present

## 2015-03-22 DIAGNOSIS — N926 Irregular menstruation, unspecified: Secondary | ICD-10-CM | POA: Diagnosis not present

## 2015-03-22 NOTE — Progress Notes (Signed)
Patient ID: Karen Schwartz MRN: [id number], DOB: May 11, 1971, 44 y.o. Date of Encounter: 03/22/2015, 3:01 PM    Chief Complaint:  Chief Complaint  Patient presents with  . irregular bleeding    positive preg test at home, feels pregnant     HPI: 44 y.o. year old white female here with her husband. Says that they have been married for 3 years and have had "unprotected sex" for 3 years and never got pregnant. This that when they first got married they considered having another baby so had unprotected sex at that time but then they did not become pregnant so they have discontinued with unprotected sex assuming that they would not become pregnant.  She says that in the past prior to this marriage, she has had pregnancies and has had children but also has had miscarriages.  She says that in September she "felt pregnant"---" says that her breasts were tender and she was gaining weight.  Says that she did a pregnancy test on September 5 which was negative. Says that at that time she was having menstrual bleeding but because she had had miscarriages in the past did a pregnancy test even though she was bleeding because she was having his other symptoms.  Says that since then she has continued to have some weight gain but the breast tenderness improved. Says that she is currently having menstrual bleeding for the past 2 weeks. Did a pregnancy test yesterday and it came back positive. Says that she does not know whether she is miscarrying or what is going on. Says that she usually does not bleed for 2 weeks like she is right now.     Home Meds:   Outpatient Prescriptions Prior to Visit  Medication Sig Dispense Refill  . amitriptyline (ELAVIL) 25 MG tablet TAKE 1 TABLET BY MOUTH AT BEDTIME. 90 tablet 3  . Ergocalciferol (VITAMIN D2) 2000 UNITS TABS Take by mouth.    . methocarbamol (ROBAXIN) 750 MG tablet TAKE 1 TABLET BY MOUTH 3 TIMES DAILY 90 tablet 2  . metoprolol succinate (TOPROL-XL) 25  MG 24 hr tablet TAKE 1 TABLET (25 MG TOTAL) BY MOUTH DAILY. 90 tablet 3  . Multiple Minerals-Vitamins (CALCIUM & VIT D3 BONE HEALTH PO) Take 600 mg by mouth daily.    . polyethylene glycol powder (GLYCOLAX/MIRALAX) powder Take 17 g by mouth 2 (two) times daily as needed. 3350 g 1  . Probiotic Product (PROBIOTIC DAILY PO) Take by mouth.    . SUMAtriptan (IMITREX) 100 MG tablet TAKE 1 TABLET BY MOUTH EVERY 2 HOURS AS NEEDED AS DIRECTED 10 tablet 3  . traMADol (ULTRAM) 50 MG tablet TAKE 1 TABLET BY MOUTH EVERY 6 HOURS AS NEEDED 120 tablet 2  . valACYclovir (VALTREX) 500 MG tablet TAKE 1 TABLET BY MOUTH DAILY. 90 tablet 11  . Omega-3 Fatty Acids (FISH OIL) 1000 MG CAPS Take by mouth.     No facility-administered medications prior to visit.    Allergies:  Allergies  Allergen Reactions  . Ampicillin   . Latex Itching  . Lyrica [Pregabalin]       Review of Systems: See HPI for pertinent ROS. All other ROS negative.    Physical Exam: Blood pressure 116/76, pulse 96, temperature 98.7 F (37.1 C), temperature source Oral, resp. rate 18, weight 136 lb (61.689 kg)., Body mass index is 24.87 kg/(m^2). General: WNWD WF. Appears in no acute distress. Neck: Supple. No thyromegaly. No lymphadenopathy. Lungs: Clear bilaterally to auscultation without wheezes, rales,  or rhonchi. Breathing is unlabored. Heart: Regular rhythm. No murmurs, rubs, or gallops. Msk:  Strength and tone normal for age. Extremities/Skin: Warm and dry. Neuro: Alert and oriented X 3. Moves all extremities spontaneously. Gait is normal. CNII-XII grossly in tact. Psych:  Responds to questions appropriately with a normal affect.     ASSESSMENT AND PLAN:  44 y.o. year old female with  1. Abnormal uterine bleeding - hCG, serum, qualitative  2. Irregular menses  Will check serum pregnancy test and will follow-up with patient when we get those results.   Signed, [address] [place], [place], [facility] 03/22/2015 3:01 PM

## 2015-03-23 ENCOUNTER — Telehealth: Payer: Self-pay | Admitting: Family Medicine

## 2015-03-23 LAB — HCG, SERUM, QUALITATIVE: PREG SERUM: POSITIVE — AB

## 2015-03-23 NOTE — Telephone Encounter (Signed)
Pt aware of below.

## 2015-03-23 NOTE — Telephone Encounter (Signed)
Elavil and tramadol are C- which means use at your own risk, we do not know for sure.  I would stop robaxin and imitrex.

## 2015-03-23 NOTE — Telephone Encounter (Signed)
Pt with positive pregnancy test.  Saw Dena Billet yesterday because she has been bleeding and didn't want you to have to do pelvic exam on her.  Qualitative HCG is positive.  Because of bleeding issues pt wanted quantitative done (has been added)    Pt concerned if truly pregnant, what medications that she currently taking will be harmful.  Specifically asked that I ask you.  Please advise?

## 2015-03-24 ENCOUNTER — Other Ambulatory Visit: Payer: Self-pay | Admitting: Family Medicine

## 2015-03-24 DIAGNOSIS — Z3201 Encounter for pregnancy test, result positive: Secondary | ICD-10-CM

## 2015-03-24 DIAGNOSIS — O209 Hemorrhage in early pregnancy, unspecified: Secondary | ICD-10-CM

## 2015-03-24 LAB — HCG, QUANTITATIVE, PREGNANCY: HCG, BETA CHAIN, QUANT, S: 595.4 m[IU]/mL — AB

## 2015-04-14 ENCOUNTER — Telehealth: Payer: Self-pay | Admitting: Family

## 2015-04-14 DIAGNOSIS — L259 Unspecified contact dermatitis, unspecified cause: Secondary | ICD-10-CM

## 2015-04-14 MED ORDER — PREDNISONE 10 MG (21) PO TBPK
ORAL_TABLET | ORAL | Status: DC
Start: 2015-04-14 — End: 2015-05-12

## 2015-04-14 NOTE — Progress Notes (Signed)

## 2015-04-19 ENCOUNTER — Telehealth: Payer: Self-pay | Admitting: Family Medicine

## 2015-04-19 MED ORDER — TRAMADOL HCL 50 MG PO TABS: 50.0000 mg | ORAL_TABLET | Freq: Four times a day (QID) | ORAL | Status: DC | PRN

## 2015-04-19 NOTE — Telephone Encounter (Signed)
Medication called/sent to requested pharmacy  

## 2015-04-19 NOTE — Telephone Encounter (Signed)
ok 

## 2015-04-19 NOTE — Telephone Encounter (Signed)
Requesting refill on Tramadol - ? OK to Refill  

## 2015-05-08 ENCOUNTER — Encounter: Payer: Self-pay | Admitting: Family Medicine

## 2015-05-11 ENCOUNTER — Other Ambulatory Visit: Payer: Self-pay | Admitting: Family Medicine

## 2015-05-11 MED ORDER — AMITRIPTYLINE HCL 50 MG PO TABS: 50.0000 mg | ORAL_TABLET | Freq: Every day | ORAL | Status: DC

## 2015-05-12 ENCOUNTER — Encounter: Payer: Self-pay | Admitting: Family Medicine

## 2015-05-12 ENCOUNTER — Ambulatory Visit (INDEPENDENT_AMBULATORY_CARE_PROVIDER_SITE_OTHER): Payer: 59 | Admitting: Family Medicine

## 2015-05-12 VITALS — BP 118/64 | HR 82 | Temp 98.7°F | Resp 14 | Ht 62.0 in | Wt 137.0 lb

## 2015-05-12 DIAGNOSIS — L200 Besnier's prurigo: Secondary | ICD-10-CM | POA: Diagnosis not present

## 2015-05-12 DIAGNOSIS — Z9109 Other allergy status, other than to drugs and biological substances: Secondary | ICD-10-CM

## 2015-05-12 DIAGNOSIS — L239 Allergic contact dermatitis, unspecified cause: Secondary | ICD-10-CM | POA: Insufficient documentation

## 2015-05-12 DIAGNOSIS — Z91048 Other nonmedicinal substance allergy status: Secondary | ICD-10-CM

## 2015-05-12 MED ORDER — PREDNISONE 10 MG PO TABS: ORAL_TABLET | ORAL | Status: DC

## 2015-05-12 MED ORDER — CLOBETASOL PROPIONATE 0.05 % EX CREA: 1.0000 "application " | TOPICAL_CREAM | Freq: Two times a day (BID) | CUTANEOUS | Status: DC

## 2015-05-12 MED ORDER — LEVOCETIRIZINE DIHYDROCHLORIDE 5 MG PO TABS: 5.0000 mg | ORAL_TABLET | Freq: Every evening | ORAL | Status: DC

## 2015-05-12 MED ORDER — METHYLPREDNISOLONE ACETATE 80 MG/ML IJ SUSP
80.0000 mg | Freq: Once | INTRAMUSCULAR | Status: AC
  Administered 2015-05-12: 80 mg via INTRAMUSCULAR

## 2015-05-12 MED ORDER — HYDROXYZINE HCL 25 MG PO TABS: 25.0000 mg | ORAL_TABLET | Freq: Three times a day (TID) | ORAL | Status: DC | PRN

## 2015-05-12 NOTE — Progress Notes (Signed)
Patient ID: Karen Schwartz, female   DOB: 1971-04-26, 44 y.o.   MRN: [id number]   Subjective:    Patient ID: Karen Schwartz, female    DOB: 03-May-1971, 44 y.o.   MRN: [id number]  Patient presents for Rash  Pt here with worsening Eczema, history of long standing eczema since childhood.Saw allergies as a child, has been on multiple topicals, antihistamines. Stress causes outbreaks, latex, but she is not sure of any other medication and food allergies that cause outbreaks. 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  A few weeks ago, she had severe eczema reaction in same distrubution as today on face, neck, she used E vist, given Medrol dosepak, but it did not completely clear, she also used benadryl, zyrtec daily. She discarded all her steroid creams as they had expired a few  Months ago.     Review Of Systems:  GEN- denies fatigue, fever, weight loss,weakness, recent illness HEENT- denies eye drainage, change in vision, nasal discharge, CVS- denies chest pain, palpitations RESP- denies SOB, cough, wheeze ABD- denies N/V, change in stools, abd pain Neuro- denies headache, dizziness, syncope, seizure activity       Objective:    BP 118/64 mmHg  Pulse 82  Temp(Src) 98.7 F (37.1 C) (Oral)  Resp 14  Ht 5\' 2"  (1.575 m)  Wt 137 lb (62.143 kg)  BMI 25.05 kg/m2  LMP 05/12/2015 (Approximate) GEN- NAD, alert and oriented x3 HEENT- PERRL, EOMI, non injected sclera, pink conjunctiva, MMM, oropharynx clear Neck- Supple, no LAD Skin- Ezematous erythematous raised rash on bilat eyelids, mild peri-oral, submandibular region extending to chest +excoriations, no pustules, no vesicles         Assessment & Plan:      Problem List Items Addressed This Visit    Environmental allergies   Eczema, allergic - Primary    Severe eczematous with facial involvement severe pruritis, recent swelling of face,lids neck. ? environmental  allergens she needs extensive allergy testing which we will get set up for.  Given Depo Medrol 80 mg IM, prednisone taper, change anti-histamine to xyzal and Atarax Given clobetasol for future smaller spots          Note: This dictation was prepared with Dragon dictation along with smaller phrase technology. Any transcriptional errors that result from this process are unintentional.

## 2015-05-12 NOTE — Assessment & Plan Note (Signed)
Severe eczematous with facial involvement severe pruritis, recent swelling of face,lids neck. ? environmental allergens she needs extensive allergy testing which we will get set up for.  Given Depo Medrol 80 mg IM, prednisone taper, change anti-histamine to xyzal and Atarax Given clobetasol for future smaller spots

## 2015-05-12 NOTE — Patient Instructions (Signed)
New allergy meds- Xyzal, Atarax Start prednisone taper tomorrow DepoMedrol injection given Allergy referral  F/U as needed

## 2015-05-19 LAB — HM MAMMOGRAPHY

## 2015-06-02 MED FILL — POLYETHYLENE GLYCOL 3350 PO: 14 days supply | Qty: 527 | Fill #1

## 2015-06-15 MED FILL — traMADol HCL 50 MG TABS: 50 | 30 days supply | Qty: 120 | Fill #2

## 2015-06-16 ENCOUNTER — Other Ambulatory Visit: Payer: Self-pay | Admitting: Family Medicine

## 2015-06-16 MED ORDER — AMITRIPTYLINE HCL 50 MG PO TABS: 50.0000 mg | ORAL_TABLET | Freq: Every day | ORAL | Status: DC

## 2015-06-16 MED FILL — AMITRIPTYLINE HCL 50 MG TAB: 50 | 30 days supply | Qty: 30 | Fill #0

## 2015-06-16 NOTE — Telephone Encounter (Signed)
Medication called/sent to requested pharmacy  

## 2015-07-05 ENCOUNTER — Other Ambulatory Visit: Payer: Self-pay | Admitting: Family Medicine

## 2015-07-06 MED FILL — METHOCARBAMOL 750 MG TABLET: 750 | 30 days supply | Qty: 90 | Fill #0 | Status: TO

## 2015-07-06 NOTE — Telephone Encounter (Signed)
ok 

## 2015-07-06 NOTE — Telephone Encounter (Signed)
Prescription sent to pharmacy.

## 2015-07-06 NOTE — Telephone Encounter (Signed)
Ok to refill 

## 2015-07-08 MED FILL — METOPROLOL SUCC ER 25 MG TA: 25 | 90 days supply | Qty: 90 | Fill #3

## 2015-07-09 ENCOUNTER — Other Ambulatory Visit: Payer: Self-pay | Admitting: *Deleted

## 2015-07-09 MED ORDER — SUMATRIPTAN SUCCINATE 100 MG PO TABS: ORAL_TABLET | ORAL | Status: DC

## 2015-07-09 MED FILL — SUMATRIPTAN SUCC 100 MG TAB: 100 | 30 days supply | Qty: 9 | Fill #0

## 2015-07-09 NOTE — Telephone Encounter (Signed)
Received fax requesting refill on Imitrex.   Refill appropriate and filled per protocol.

## 2015-07-19 ENCOUNTER — Other Ambulatory Visit: Payer: Self-pay | Admitting: Family Medicine

## 2015-07-19 NOTE — Telephone Encounter (Signed)
rx called in

## 2015-07-19 NOTE — Telephone Encounter (Signed)
ok 

## 2015-07-19 NOTE — Telephone Encounter (Signed)
?   OK to Refill  

## 2015-07-20 ENCOUNTER — Encounter: Payer: Self-pay | Admitting: Family Medicine

## 2015-07-20 MED FILL — traMADol HCL 50 MG TABS: 50 | 30 days supply | Qty: 120 | Fill #0

## 2015-07-26 ENCOUNTER — Encounter: Payer: Self-pay | Admitting: Family Medicine

## 2015-07-29 MED FILL — PREVIDENT 5000 BOOSTER PLUS: 1.1 | 30 days supply | Qty: 100 | Fill #1

## 2015-07-29 MED FILL — AMITRIPTYLINE HCL 50 MG TAB: 50 | 30 days supply | Qty: 30 | Fill #1

## 2015-08-03 ENCOUNTER — Other Ambulatory Visit: Payer: Self-pay | Admitting: *Deleted

## 2015-08-03 DIAGNOSIS — K589 Irritable bowel syndrome without diarrhea: Secondary | ICD-10-CM

## 2015-08-03 MED ORDER — POLYETHYLENE GLYCOL 3350 17 GM/SCOOP PO POWD
17.0000 g | Freq: Two times a day (BID) | ORAL | Status: DC | PRN
Start: 2015-08-03 — End: 2016-11-07

## 2015-08-03 MED FILL — POLYETHYLENE GLYCOL 3350 PO: 30 days supply | Qty: 1054 | Fill #0

## 2015-08-03 NOTE — Telephone Encounter (Signed)
Received fax requesting refill on Miralax.   Refill appropriate and filled per protocol. 

## 2015-08-06 MED FILL — LEVOCETIRIZINE 5 MG TABLET: 5 | 30 days supply | Qty: 30 | Fill #1

## 2015-08-18 MED FILL — traMADol HCL 50 MG TABS: 50 | 30 days supply | Qty: 120 | Fill #1

## 2015-08-25 ENCOUNTER — Ambulatory Visit: Payer: Self-pay | Admitting: Internal Medicine

## 2015-08-27 ENCOUNTER — Encounter: Payer: Self-pay | Admitting: Family Medicine

## 2015-08-27 MED ORDER — AMITRIPTYLINE HCL 50 MG PO TABS: 50.0000 mg | ORAL_TABLET | Freq: Every day | ORAL | Status: DC

## 2015-08-27 MED FILL — AMITRIPTYLINE HCL 50 MG TAB: 50 | 90 days supply | Qty: 90 | Fill #0

## 2015-09-01 ENCOUNTER — Ambulatory Visit (INDEPENDENT_AMBULATORY_CARE_PROVIDER_SITE_OTHER): Payer: 59 | Admitting: Internal Medicine

## 2015-09-01 ENCOUNTER — Encounter: Payer: Self-pay | Admitting: Internal Medicine

## 2015-09-01 ENCOUNTER — Other Ambulatory Visit: Payer: 59

## 2015-09-01 VITALS — BP 122/76 | HR 87 | Ht 62.0 in | Wt 132.6 lb

## 2015-09-01 DIAGNOSIS — Z9109 Other allergy status, other than to drugs and biological substances: Secondary | ICD-10-CM

## 2015-09-01 DIAGNOSIS — Z91048 Other nonmedicinal substance allergy status: Secondary | ICD-10-CM

## 2015-09-01 DIAGNOSIS — L239 Allergic contact dermatitis, unspecified cause: Secondary | ICD-10-CM

## 2015-09-01 DIAGNOSIS — L200 Besnier's prurigo: Secondary | ICD-10-CM | POA: Diagnosis not present

## 2015-09-01 NOTE — Progress Notes (Signed)
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 Pt states she has had issues with laundry detergent-several attacks of itching. No Allergy labs done. Chronic episodic itching with no prior allergy evaluation. Not usually rhinitis. Latex makes her nose age. If she handles her cat tthen touches her face, eyelids will itch. No definite hives. Never diagnosed with eczema. No prior asthma. No angioedema. Denies reaction to foods or unusual reaction to insect stings. Clobetasol cream has helped skin especially flexure areas at elbows. Xyzal antihistamine has been a substantial help. 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 Lives in a house with central air conditioning, no basement, no significant mold or water problems. Indoor carpet. Cat and dog.  Prior to Admission medications   Medication Sig Start Date End Date Taking? Authorizing Provider  amitriptyline (ELAVIL) 50 MG tablet Take 1 tablet (50 mg total) by mouth at bedtime. 08/27/15  Yes Alycia Rossetti, MD  Calcium Citrate-Vitamin D (CALCIUM + D PO) Take by mouth.   Yes Historical Provider, MD  clobetasol cream (TEMOVATE) [id number] % Apply 1 application topically 2 (two) times daily. 05/12/15  Yes Alycia Rossetti, MD  Ergocalciferol (VITAMIN D2) 2000 UNITS TABS Take by mouth.   Yes Historical Provider, MD  guaiFENesin (MUCINEX) 600 MG 12 hr tablet Take 600 mg by mouth 2 (two) times daily.   Yes Historical Provider, MD  hydrOXYzine (ATARAX/VISTARIL) 25 MG tablet Take 1 tablet (25 mg total) by mouth 3 (three) times daily as needed. 05/12/15  Yes Alycia Rossetti, MD  levocetirizine (XYZAL) 5 MG tablet Take 1 tablet (5 mg total) by mouth every evening. 05/12/15  Yes Alycia Rossetti, MD  methocarbamol (ROBAXIN) 750 MG tablet TAKE 1 TABLET BY MOUTH 3 TIMES DAILY 07/06/15  Yes Susy Frizzle, MD  metoprolol succinate (TOPROL-XL) 25 MG 24 hr tablet  TAKE 1 TABLET (25 MG TOTAL) BY MOUTH DAILY. 09/14/14  Yes Susy Frizzle, MD  Multiple Minerals-Vitamins (CALCIUM & VIT D3 BONE HEALTH PO) Take 600 mg by mouth daily.   Yes Historical Provider, MD  polyethylene glycol powder (GLYCOLAX/MIRALAX) powder Take 17 g by mouth 2 (two) times daily as needed. Patient taking differently: Take 17 g by mouth daily.  08/03/15  Yes Susy Frizzle, MD  Probiotic Product (PROBIOTIC DAILY PO) Take by mouth.   Yes Historical Provider, MD  SUMAtriptan (IMITREX) 100 MG tablet TAKE 1 TABLET BY MOUTH EVERY 2 HOURS AS NEEDED AS DIRECTED 07/09/15  Yes Susy Frizzle, MD  traMADol (ULTRAM) 50 MG tablet TAKE 1 TABLET BY MOUTH EVERY 6 HOURS AS NEEDED 07/19/15  Yes Susy Frizzle, MD   Past Medical History  Diagnosis Date  . Fibromyalgia   . Eczema   . Constipation   . Chronic headaches   . Hypertension     boderline   No past surgical history on file. Family History  Problem Relation Age of Onset  . Hypertension Mother   . Depression Mother   . Cancer Father     bladder cancer, rectal cancer  . Hypertension Sister   . Depression Sister   . Mental illness Brother     suicide  . Alcohol abuse Brother   . Diabetes Maternal Grandmother   . Macular degeneration Maternal Grandmother   . Dementia Maternal Grandfather    Social History   Social History  . Marital Status: Married    Spouse Name: N/A  . Number of  Children: N/A  . Years of Education: N/A   Occupational History  . Not on file.   Social History Main Topics  . Smoking status: Former Smoker -- 1.00 packs/day for 12 years    Types: Cigarettes    Quit date: 05/23/1999  . Smokeless tobacco: Not on file  . Alcohol Use: No     Comment: beer  . Drug Use: No  . Sexual Activity: Not on file     Comment: divorced, remarried, 2 kids, RN   Other Topics Concern  . Not on file   Social History Narrative   ROS-see HPI   Negative unless "+" Constitutional:    weight loss, night sweats,  fevers, chills, fatigue, lassitude. HEENT:    headaches, difficulty swallowing, tooth/dental problems, sore throat,       sneezing,+ itching, ear ache, nasal congestion, post nasal drip, snoring CV:    chest pain, orthopnea, PND, swelling in lower extremities, anasarca,                                                             dizziness, palpitations Resp:   shortness of breath with exertion or at rest.                productive cough,   non-productive cough, coughing up of blood.              change in color of mucus.  wheezing.   Skin:    + rash or lesions. GI:  No-   heartburn, indigestion, abdominal pain, nausea, vomiting, diarrhea,                 change in bowel habits, loss of appetite GU: dysuria, change in color of urine, no urgency or frequency.   flank pain. MS:   joint pain, stiffness, decreased range of motion, back pain. Neuro-     nothing unusual Psych:  change in mood or affect.  depression or anxiety.   memory loss.  OBJ- Physical Exam General- Alert, Oriented, Affect-appropriate, Distress- none acute Skin- + dry scaling eczema at nape of neck Lymphadenopathy- none Head- atraumatic            Eyes- Gross vision intact, PERRLA, conjunctivae and secretions clear            Ears- Hearing, canals-normal            Nose- Clear, no-Septal dev, mucus, polyps, erosion, perforation             Throat- Mallampati II , mucosa clear , drainage- none, tonsils- atrophic Neck- flexible , trachea midline, no stridor , thyroid nl, carotid no bruit Chest - symmetrical excursion , unlabored           Heart/CV- RRR , no murmur , no gallop  , no rub, nl s1 s2                           - JVD- none , edema- none, stasis changes- none, varices- none           Lung- clear to P&A, wheeze- none, cough- none , dullness-none, rub- none           Chest wall-  Abd-  Br/ Gen/ Rectal- Not done, not indicated  Extrem- cyanosis- none, clubbing, none, atrophy- none, strength- nl Neuro- grossly intact  to observation

## 2015-09-01 NOTE — Patient Instructions (Signed)
Order- Labs-  Allergy profile, Food IgE profile  Latex allergy- avoid latex  Fine to continue Xyzal for now. We will see if other recommendations are suggested by the lab results.

## 2015-09-01 NOTE — Assessment & Plan Note (Signed)
Probably a combination of eczema and atopic dermatitis. No urticaria or angioedema identified from history. Plan- allergy profile,  Food IgE panel. Suggest avoiding laundry products with bacterial enzymes. Okay to continue Xyzal antihistamine. OTC allergy eyedrops if needed.

## 2015-09-02 LAB — RESPIRATORY ALLERGY PROFILE REGION II ~~LOC~~
ASPERGILLUS FUMIGATUS M3: 0.27 kU/L — AB
Allergen, Cedar tree, t12: <0.1 kU/L
Allergen, Comm Silver Birch, t9: <0.1 kU/L
Allergen, Cottonwood, t14: <0.1 kU/L
Allergen, Mouse Urine Protein, e78: <0.1 kU/L
Allergen, Oak,t7: <0.1 kU/L
Bermuda Grass: <0.1 kU/L
Box Elder IgE: <0.1 kU/L
Cat Dander: 0.47 kU/L — ABNORMAL HIGH
Cockroach: <0.1 kU/L
Dog Dander: <0.1 kU/L
Elm IgE: <0.1 kU/L
IgE (Immunoglobulin E), Serum: 224 kU/L — ABNORMAL HIGH (ref <115)
Sheep Sorrel IgE: <0.1 kU/L

## 2015-09-04 LAB — ALLERGEN FOOD PROFILE SPECIFIC IGE
Allergen Corn, IgE: <0.1 kU/L
Codfish IgE: <0.1 kU/L
Egg White IgE: <0.1 kU/L
IGE (IMMUNOGLOBULIN E), SERUM: 217 [IU]/mL — AB (ref 0–100)
Milk IgE: <0.1 kU/L
Shrimp IgE: <0.1 kU/L
Wheat IgE: <0.1 kU/L

## 2015-09-15 MED FILL — traMADol HCL 50 MG TABS: 50 | 30 days supply | Qty: 120 | Fill #2

## 2015-09-27 MED FILL — LEVOCETIRIZINE 5 MG TABLET: 5 | 30 days supply | Qty: 30 | Fill #2

## 2015-10-11 ENCOUNTER — Other Ambulatory Visit: Payer: Self-pay | Admitting: Family Medicine

## 2015-10-11 NOTE — Telephone Encounter (Signed)
Medication called to pharmacy. 

## 2015-10-11 NOTE — Telephone Encounter (Signed)
Ok to refill 

## 2015-10-11 NOTE — Telephone Encounter (Signed)
Okay to refill? 

## 2015-10-13 MED FILL — traMADol HCL 50 MG TABS: 50 | 30 days supply | Qty: 120 | Fill #0

## 2015-10-14 ENCOUNTER — Other Ambulatory Visit: Payer: Self-pay | Admitting: Family Medicine

## 2015-10-14 MED FILL — METOPROLOL SUCC ER 25 MG TA: 25 | 90 days supply | Qty: 90 | Fill #0

## 2015-10-21 MED FILL — LEVOCETIRIZINE 5 MG TABLET: 5 | 90 days supply | Qty: 90 | Fill #3

## 2015-10-26 MED FILL — POLYETHYLENE GLYCOL 3350 PO: 30 days supply | Qty: 1054 | Fill #1

## 2015-11-03 MED FILL — SUMATRIPTAN SUCC 100 MG TAB: 100 | 30 days supply | Qty: 9 | Fill #1

## 2015-11-10 MED FILL — traMADol HCL 50 MG TABS: 50 | 30 days supply | Qty: 120 | Fill #1

## 2015-11-29 ENCOUNTER — Encounter: Payer: Self-pay | Admitting: Family Medicine

## 2015-11-29 MED FILL — METHOCARBAMOL 750 MG TABLET: 750 | 30 days supply | Qty: 90 | Fill #1 | Status: TO

## 2015-11-29 MED FILL — hydrOXYzine HCL 25 MG TABS: 25 | 15 days supply | Qty: 45 | Fill #1

## 2015-11-29 MED FILL — AMITRIPTYLINE HCL 50 MG TAB: 50 | 90 days supply | Qty: 90 | Fill #1

## 2015-11-30 MED ORDER — CIPROFLOXACIN HCL 500 MG PO TABS: 500.0000 mg | ORAL_TABLET | Freq: Two times a day (BID) | ORAL | Status: DC

## 2015-12-13 MED FILL — traMADol HCL 50 MG TABS: 50 | 30 days supply | Qty: 120 | Fill #2

## 2015-12-17 ENCOUNTER — Telehealth: Payer: 59 | Admitting: Family

## 2015-12-17 DIAGNOSIS — A499 Bacterial infection, unspecified: Secondary | ICD-10-CM

## 2015-12-17 DIAGNOSIS — H1089 Other conjunctivitis: Secondary | ICD-10-CM | POA: Diagnosis not present

## 2015-12-17 DIAGNOSIS — H109 Unspecified conjunctivitis: Secondary | ICD-10-CM

## 2015-12-17 MED ORDER — POLYMYXIN B-TRIMETHOPRIM 10000-0.1 UNIT/ML-% OP SOLN: 1.0000 [drp] | OPHTHALMIC | 0 refills | Status: DC

## 2015-12-17 NOTE — Progress Notes (Signed)

## 2016-01-06 ENCOUNTER — Ambulatory Visit: Payer: 59 | Admitting: Internal Medicine

## 2016-01-10 MED FILL — METOPROLOL SUCC ER 25 MG TA: 25 | 90 days supply | Qty: 90 | Fill #1

## 2016-01-11 ENCOUNTER — Telehealth: Payer: Self-pay | Admitting: *Deleted

## 2016-01-11 NOTE — Telephone Encounter (Signed)
Okay to refill? 

## 2016-01-11 NOTE — Telephone Encounter (Signed)
Received fax requesting refill on Tramadol.   Ok to refill??  Last office visit 05/12/2015.  Last refill  10/11/2015, #2 refills.

## 2016-01-12 MED ORDER — TRAMADOL HCL 50 MG PO TABS: 50.0000 mg | ORAL_TABLET | Freq: Four times a day (QID) | ORAL | 2 refills | Status: DC | PRN

## 2016-01-12 MED FILL — traMADol HCL 50 MG TABS: 50 | 30 days supply | Qty: 120 | Fill #0

## 2016-01-12 NOTE — Telephone Encounter (Signed)
Medication called to pharmacy. 

## 2016-01-24 ENCOUNTER — Encounter: Payer: Self-pay | Admitting: Family Medicine

## 2016-01-31 ENCOUNTER — Encounter: Payer: Self-pay | Admitting: Family Medicine

## 2016-01-31 ENCOUNTER — Ambulatory Visit (INDEPENDENT_AMBULATORY_CARE_PROVIDER_SITE_OTHER): Payer: 59 | Admitting: Family Medicine

## 2016-01-31 VITALS — BP 108/62 | HR 90 | Temp 98.2°F | Resp 14 | Ht 62.0 in | Wt 136.0 lb

## 2016-01-31 DIAGNOSIS — I10 Essential (primary) hypertension: Secondary | ICD-10-CM | POA: Diagnosis not present

## 2016-01-31 DIAGNOSIS — L218 Other seborrheic dermatitis: Secondary | ICD-10-CM

## 2016-01-31 DIAGNOSIS — L219 Seborrheic dermatitis, unspecified: Secondary | ICD-10-CM

## 2016-01-31 DIAGNOSIS — L200 Besnier's prurigo: Secondary | ICD-10-CM | POA: Diagnosis not present

## 2016-01-31 DIAGNOSIS — Z0000 Encounter for general adult medical examination without abnormal findings: Secondary | ICD-10-CM

## 2016-01-31 DIAGNOSIS — E559 Vitamin D deficiency, unspecified: Secondary | ICD-10-CM | POA: Diagnosis not present

## 2016-01-31 DIAGNOSIS — Z91048 Other nonmedicinal substance allergy status: Secondary | ICD-10-CM

## 2016-01-31 DIAGNOSIS — L239 Allergic contact dermatitis, unspecified cause: Secondary | ICD-10-CM

## 2016-01-31 DIAGNOSIS — Z9109 Other allergy status, other than to drugs and biological substances: Secondary | ICD-10-CM

## 2016-01-31 LAB — COMPREHENSIVE METABOLIC PANEL
ALBUMIN: 4.4 g/dL (ref 3.6–5.1)
ALT: 40 U/L — ABNORMAL HIGH (ref 6–29)
AST: 31 U/L — ABNORMAL HIGH (ref 10–30)
Alkaline Phosphatase: 50 U/L (ref 33–115)
BUN: 11 mg/dL (ref 7–25)
CALCIUM: 9.2 mg/dL (ref 8.6–10.2)
CHLORIDE: 103 mmol/L (ref 98–110)
CO2: 25 mmol/L (ref 20–31)
Creat: 0.7 mg/dL (ref 0.50–1.10)
GLUCOSE: 89 mg/dL (ref 70–99)
Potassium: 4.2 mmol/L (ref 3.5–5.3)
SODIUM: 137 mmol/L (ref 135–146)
Total Bilirubin: 0.5 mg/dL (ref 0.2–1.2)
Total Protein: 6.5 g/dL (ref 6.1–8.1)

## 2016-01-31 LAB — LIPID PANEL
Cholesterol: 154 mg/dL (ref 125–200)
HDL: 59 mg/dL (ref >=46)
LDL CALC: 71 mg/dL (ref <130)
TRIGLYCERIDES: 118 mg/dL (ref <150)
Total CHOL/HDL Ratio: 2.6 Ratio (ref <=5.0)
VLDL: 24 mg/dL (ref <30)

## 2016-01-31 LAB — CBC WITH DIFFERENTIAL/PLATELET
BASOS ABS: 97 {cells}/uL (ref 0–200)
Basophils Relative: 1 %
EOS PCT: 5 %
Eosinophils Absolute: 485 cells/uL (ref 15–500)
HEMATOCRIT: 42.6 % (ref 35.0–45.0)
HEMOGLOBIN: 14.2 g/dL (ref 12.0–15.0)
LYMPHS ABS: 4074 {cells}/uL — AB (ref 850–3900)
LYMPHS PCT: 42 %
MCH: 28.5 pg (ref 27.0–33.0)
MCHC: 33.3 g/dL (ref 32.0–36.0)
MCV: 85.4 fL (ref 80.0–100.0)
MPV: 10.5 fL (ref 7.5–12.5)
Monocytes Absolute: 582 cells/uL (ref 200–950)
Monocytes Relative: 6 %
NEUTROS PCT: 46 %
Neutro Abs: 4462 cells/uL (ref 1500–7800)
Platelets: 276 10*3/uL (ref 140–400)
RBC: 4.99 MIL/uL (ref 3.80–5.10)
RDW: 13.3 % (ref 11.0–15.0)
WBC: 9.7 10*3/uL (ref 3.8–10.8)

## 2016-01-31 MED ORDER — KETOCONAZOLE 2 % EX SHAM: 1.0000 "application " | MEDICATED_SHAMPOO | CUTANEOUS | 2 refills | Status: DC

## 2016-01-31 MED ORDER — METHYLPREDNISOLONE ACETATE 40 MG/ML IJ SUSP
40.0000 mg | Freq: Once | INTRAMUSCULAR | Status: AC
  Administered 2016-01-31: 40 mg via INTRAMUSCULAR

## 2016-01-31 MED FILL — KETOCONAZOLE 2% SHAMPOO: 2 | 10 days supply | Qty: 120 | Fill #0

## 2016-01-31 NOTE — Assessment & Plan Note (Signed)
Given Dep Medrol due to redness and breakout on face

## 2016-01-31 NOTE — Progress Notes (Signed)
   Subjective:    Patient ID: Karen Schwartz, female    DOB: [birth year], 45 y.o.   MRN: [id number]  Patient presents for CPE (no PAP- is fasting) Patient here for physical exam. She is followed by GYN for her Pap smear. Her mammogram is up-to-date due in the winter. Flu shot to be done with her job Medications reviewed in detail. As for medications are for treatment of her chronic pain secondary to fibromyalgia. She has a takes guaifenesin for her fibromyalgia as well which she states has helped. She is trying to be more active she is now taking tae kwon do with her daughter.  She is allergic eczema and seasonal allergies. She is allergic to her cat but does not want to get rid of it she now has some itching and redness around her face where her cat came in contact with it  He has long-standing history of seborrheic dermatitis in her scalp she uses clobetasol to the area she's tried multiple over-the-counter shampoos has seen dermatology in the past.  Review Of Systems:  GEN- denies fatigue, fever, weight loss,weakness, recent illness HEENT- denies eye drainage, change in vision, nasal discharge, CVS- denies chest pain, palpitations RESP- denies SOB, cough, wheeze ABD- denies N/V, change in stools, abd pain GU- denies dysuria, hematuria, dribbling, incontinence MSK- denies joint pain, muscle aches, injury Neuro- denies headache, dizziness, syncope, seizure activity       Objective:    BP 108/62 (BP Location: Left Arm, Patient Position: Sitting, Cuff Size: Normal)   Pulse 90   Temp 98.2 F (36.8 C) (Oral)   Resp 14   Ht 5\' 2"  (1.575 m)   Wt 136 lb (61.7 kg)   LMP 01/10/2016 (Approximate)   BMI 24.87 kg/m  GEN- NAD, alert and oriented x3 HEENT- PERRL, EOMI, non injected sclera, pink conjunctiva, MMM, oropharynx clear, Eerythema around mouth, small area of urticaria above lip on philtrum  Neck- Supple, no thyromegaly CVS- RRR, no murmur RESP-CTAB ABD-NABS,soft,NT,ND Skin-  scaley flakey scalp, +exocoriations few areas of scrabs  EXT- No edema Pulses- Radial, DP- 2+        Assessment & Plan:      Problem List Items Addressed This Visit    Hypertension    Well controlled on toprol      Relevant Orders   Comprehensive metabolic panel   Lipid panel   Environmental allergies   Relevant Medications   methylPREDNISolone acetate (DEPO-MEDROL) injection 40 mg (Completed)   Eczema, allergic    Given Dep Medrol due to redness and breakout on face        Other Visit Diagnoses    Routine general medical examination at a health care facility    -  Primary   CPE done, f/u GYN, Flu at work, Check Vitamin D, fasting labs,reviewed meds    Relevant Orders   CBC with Differential/Platelet   Comprehensive metabolic panel   Lipid panel   TSH   Vitamin D deficiency       Relevant Orders   Vitamin D, 25-hydroxy   Seborrheic dermatitis of scalp       Nizoral twice a week, with spot treatment with clobetasol      Note: This dictation was prepared with Dragon dictation along with smaller phrase technology. Any transcriptional errors that result from this process are unintentional.

## 2016-01-31 NOTE — Patient Instructions (Addendum)
I recommend eye visit once a year I recommend dental visit every 6 months Goal is to  Exercise 30 minutes 5 days a week We will send a letter with lab results  Use Nizoral twice a day  Flu shot at work  F/U 6 months

## 2016-01-31 NOTE — Assessment & Plan Note (Signed)
Well controlled on toprol

## 2016-02-01 LAB — VITAMIN D 25 HYDROXY (VIT D DEFICIENCY, FRACTURES): VIT D 25 HYDROXY: 43 ng/mL (ref 30–100)

## 2016-02-01 LAB — TSH: TSH: 3.41 m[IU]/L

## 2016-02-04 ENCOUNTER — Other Ambulatory Visit: Payer: Self-pay | Admitting: *Deleted

## 2016-02-04 DIAGNOSIS — R945 Abnormal results of liver function studies: Principal | ICD-10-CM

## 2016-02-04 DIAGNOSIS — R7989 Other specified abnormal findings of blood chemistry: Secondary | ICD-10-CM

## 2016-02-10 MED FILL — traMADol HCL 50 MG TABS: 50 | 30 days supply | Qty: 120 | Fill #1

## 2016-02-15 MED FILL — METHOCARBAMOL 750 MG TABLET: 750 | 30 days supply | Qty: 90 | Fill #2 | Status: TO

## 2016-02-15 MED FILL — LEVOCETIRIZINE 5 MG TABLET: 5 | 30 days supply | Qty: 30 | Fill #4

## 2016-03-03 MED FILL — POLYETHYLENE GLYCOL 3350 PO: 30 days supply | Qty: 1054 | Fill #2

## 2016-03-06 ENCOUNTER — Other Ambulatory Visit: Payer: 59

## 2016-03-06 DIAGNOSIS — R945 Abnormal results of liver function studies: Principal | ICD-10-CM

## 2016-03-06 DIAGNOSIS — R7989 Other specified abnormal findings of blood chemistry: Secondary | ICD-10-CM | POA: Diagnosis not present

## 2016-03-06 LAB — COMPLETE METABOLIC PANEL WITH GFR
ALT: 12 U/L (ref 6–29)
AST: 18 U/L (ref 10–30)
Albumin: 3.8 g/dL (ref 3.6–5.1)
Alkaline Phosphatase: 44 U/L (ref 33–115)
BUN: 14 mg/dL (ref 7–25)
CHLORIDE: 104 mmol/L (ref 98–110)
CO2: 29 mmol/L (ref 20–31)
CREATININE: 0.74 mg/dL (ref 0.50–1.10)
Calcium: 8.8 mg/dL (ref 8.6–10.2)
GFR, Est Non African American: >89 mL/min (ref >=60)
Glucose, Bld: 134 mg/dL — ABNORMAL HIGH (ref 70–99)
Potassium: 4 mmol/L (ref 3.5–5.3)
Sodium: 140 mmol/L (ref 135–146)
Total Bilirubin: 0.4 mg/dL (ref 0.2–1.2)
Total Protein: 6 g/dL — ABNORMAL LOW (ref 6.1–8.1)

## 2016-03-09 ENCOUNTER — Other Ambulatory Visit: Payer: Self-pay | Admitting: Family Medicine

## 2016-03-09 MED FILL — SUMATRIPTAN SUCC 100 MG TAB: 100 | 30 days supply | Qty: 9 | Fill #2

## 2016-03-09 MED FILL — traMADol HCL 50 MG TABS: 50 | 30 days supply | Qty: 120 | Fill #2

## 2016-03-09 MED FILL — AMITRIPTYLINE HCL 50 MG TAB: 50 | 90 days supply | Qty: 90 | Fill #0

## 2016-03-23 MED FILL — PREVIDENT 5000 BOOSTER PLUS: 1.1 | 30 days supply | Qty: 100 | Fill #2

## 2016-04-06 ENCOUNTER — Other Ambulatory Visit: Payer: Self-pay | Admitting: Family Medicine

## 2016-04-06 MED FILL — METOPROLOL SUCC ER 25 MG TA: 25 | 90 days supply | Qty: 90 | Fill #2

## 2016-04-06 NOTE — Telephone Encounter (Signed)
Ok to refill??  Last office visit 01/31/2016.  Last refill 01/12/2016, #2 refills.

## 2016-04-07 MED FILL — traMADol HCL 50 MG TABS: 50 | 30 days supply | Qty: 120 | Fill #0

## 2016-04-07 NOTE — Telephone Encounter (Signed)
Medication called to pharmacy. 

## 2016-04-07 NOTE — Telephone Encounter (Signed)
okay

## 2016-05-08 MED FILL — traMADol HCL 50 MG TABS: 50 | 30 days supply | Qty: 120 | Fill #1

## 2016-06-05 MED FILL — traMADol HCL 50 MG TABS: 50 | 30 days supply | Qty: 120 | Fill #2

## 2016-06-05 MED FILL — AMITRIPTYLINE HCL 50 MG TAB: 50 | 90 days supply | Qty: 90 | Fill #1

## 2016-06-20 ENCOUNTER — Telehealth: Payer: Self-pay | Admitting: *Deleted

## 2016-06-20 MED ORDER — OSELTAMIVIR PHOSPHATE 75 MG PO CAPS: 75.0000 mg | ORAL_CAPSULE | Freq: Every day | ORAL | 0 refills | Status: DC

## 2016-06-20 NOTE — Telephone Encounter (Signed)
Patient family seen in office for influenza.   Prophylactic prescription for Tamiflu given.   Prescription sent to pharmacy.

## 2016-06-21 MED FILL — OSELTAMIVIR PHOS 75 MG CAP: 75 | 7 days supply | Qty: 7 | Fill #0

## 2016-07-03 MED FILL — POLYETHYLENE GLYCOL 3350 PO: 30 days supply | Qty: 1054 | Fill #3

## 2016-07-05 ENCOUNTER — Other Ambulatory Visit: Payer: Self-pay | Admitting: Family Medicine

## 2016-07-05 MED FILL — METOPROLOL SUCC ER 25 MG TA: 25 | 90 days supply | Qty: 90 | Fill #3

## 2016-07-05 NOTE — Telephone Encounter (Signed)
Okay to refill? 

## 2016-07-05 NOTE — Telephone Encounter (Signed)
Ok to refill??  Last office visit 01/31/2016.  Last refill 04/07/2016, #2 refills.

## 2016-07-06 MED FILL — traMADol HCL 50 MG TABS: 50 | 30 days supply | Qty: 120 | Fill #0

## 2016-07-06 NOTE — Telephone Encounter (Signed)
Medication called to pharmacy. 

## 2016-07-10 ENCOUNTER — Encounter: Payer: Self-pay | Admitting: Family Medicine

## 2016-07-26 ENCOUNTER — Telehealth: Payer: Self-pay | Admitting: Family Medicine

## 2016-07-26 NOTE — Telephone Encounter (Signed)
Patient has Dx of HTN. Though controlled, Dr. Buelah Manis recommends F/U visit at least once every (6) months.   Per patient last AVS, F/U 6 months for chronic medical problems.

## 2016-07-26 NOTE — Telephone Encounter (Signed)
Patient called to see if she needs to come in for her 6 mth follow up. She states she doesn't remember what she is following up on and if it is necessary for her to come in.  CB# [phone]

## 2016-07-28 ENCOUNTER — Encounter: Payer: Self-pay | Admitting: Family Medicine

## 2016-07-31 ENCOUNTER — Ambulatory Visit: Payer: 59 | Admitting: Family Medicine

## 2016-08-02 ENCOUNTER — Encounter: Payer: Self-pay | Admitting: Family Medicine

## 2016-08-02 ENCOUNTER — Ambulatory Visit (INDEPENDENT_AMBULATORY_CARE_PROVIDER_SITE_OTHER): Payer: 59 | Admitting: Family Medicine

## 2016-08-02 VITALS — BP 122/60 | HR 110 | Temp 98.1°F | Resp 14 | Ht 62.0 in | Wt 138.0 lb

## 2016-08-02 DIAGNOSIS — R000 Tachycardia, unspecified: Secondary | ICD-10-CM | POA: Diagnosis not present

## 2016-08-02 DIAGNOSIS — M797 Fibromyalgia: Secondary | ICD-10-CM

## 2016-08-02 DIAGNOSIS — I10 Essential (primary) hypertension: Secondary | ICD-10-CM

## 2016-08-02 DIAGNOSIS — K582 Mixed irritable bowel syndrome: Secondary | ICD-10-CM

## 2016-08-02 DIAGNOSIS — Z9109 Other allergy status, other than to drugs and biological substances: Secondary | ICD-10-CM | POA: Diagnosis not present

## 2016-08-02 NOTE — Patient Instructions (Addendum)
Take 25mg  in morning and 12.5mg  in the evening Continue all other medications F/U 6 months for Physical

## 2016-08-02 NOTE — Progress Notes (Signed)
Subjective:    Patient ID: Karen Schwartz, female    DOB: 28-Apr-1971, 46 y.o.   MRN: [id number]  Patient presents for 6 month F/U (is not fasting)  Pt here to f/u medications and chronic medial problems  She has significant history of fibromyalgia in the setting of multiple stressors and these from previous marriage. She's been on multiple medications through the years to try to treat her fibromyalgia including Lyrica, Cymbalta, Pamelor, Elavil, tramadol, anti-inflammatories. She also does massage therapy/chiropractor and she's been taking guaifenesin which she states there is some research out in [place] about fibromyalgia and feels like this helps. We'll recently she added and a TENS unit. That she has pain daily she feels like this is the best that she has been quite some time. She did ask if there were any other anti-inflammatories that she could possibly use   She also has history of irritable bowel syndrome typically constipated in she takes probiotics but also uses MiraLAX as needed.  Migraine she takes sumatriptan as needed.  Sinus tachycardia she's had tachycardia for many years. She has been maintained on metoprolol which she actually takes 12.5 mg twice a day of extender release and has been doing this for years. She has noted her heart rate does continue to jump up this past week is been 110 and 130s occasionally she has palpitations. She's never had stress testing or workup by cardiology and also does not want to pursue this.   Review Of Systems:  GEN- denies fatigue, fever, weight loss,weakness, recent illness HEENT- denies eye drainage, change in vision, nasal discharge, CVS- denies chest pain, palpitations RESP- denies SOB, cough, wheeze ABD- denies N/V, change in stools, abd pain GU- denies dysuria, hematuria, dribbling, incontinence MSK- denies joint pain, muscle aches, injury Neuro- denies headache, dizziness, syncope, seizure activity       Objective:    BP  122/60   Pulse (!) 110   Temp 98.1 F (36.7 C) (Oral)   Resp 14   Ht 5\' 2"  (1.575 m)   Wt 138 lb (62.6 kg)   LMP 07/17/2016 (Approximate) Comment: regular  SpO2 98%   BMI 25.24 kg/m  GEN- NAD, alert and oriented x3 HEENT- PERRL, EOMI, non injected sclera, pink conjunctiva, MMM, oropharynx clear Neck- Supple, no thyromegaly CVS- tachycardic, no murmur RESP-CTAB Psych- normal affect and mood  EXT- No edema Pulses- Radial, 2+  EKG- NSR HR 98,no ST changes       Assessment & Plan:      Problem List Items Addressed This Visit    Tachycardia    History of chronic tachycardia. I discussed with her with recommend that she be seen by cardiology haven't at go see with you by source of her tachycardia. She is not having thyroid disorder no electrolyte abnormalities. She was to hold off at this time. I will increase her metoprolol to a total of 37.5 mg daily and see how she does.      Relevant Orders   EKG 12-Lead (Completed)   IBS (irritable bowel syndrome)   Hypertension - Primary    Blood pressure is controlled      Fibromyalgia    We discussed other anti-inflammatory she is actually tried multiple in the past. She does have both here and gel at home that she wants to try which I think is reasonable that she has a large surface area to try to cover. We also discussed diclofenac and Celebrex but she believes she has been on  Celebrex in the past. For now she will continue her tramadol Elavil and Robaxin      Environmental allergies      Note: This dictation was prepared with Dragon dictation along with smaller phrase technology. Any transcriptional errors that result from this process are unintentional.

## 2016-08-03 ENCOUNTER — Encounter: Payer: Self-pay | Admitting: Family Medicine

## 2016-08-03 DIAGNOSIS — K589 Irritable bowel syndrome without diarrhea: Secondary | ICD-10-CM | POA: Insufficient documentation

## 2016-08-03 NOTE — Assessment & Plan Note (Signed)
Blood pressure is controlled

## 2016-08-03 NOTE — Assessment & Plan Note (Signed)
History of chronic tachycardia. I discussed with her with recommend that she be seen by cardiology haven't at go see with you by source of her tachycardia. She is not having thyroid disorder no electrolyte abnormalities. She was to hold off at this time. I will increase her metoprolol to a total of 37.5 mg daily and see how she does.

## 2016-08-03 NOTE — Assessment & Plan Note (Signed)
We discussed other anti-inflammatory she is actually tried multiple in the past. She does have both here and gel at home that she wants to try which I think is reasonable that she has a large surface area to try to cover. We also discussed diclofenac and Celebrex but she believes she has been on Celebrex in the past. For now she will continue her tramadol Elavil and Robaxin

## 2016-08-04 ENCOUNTER — Other Ambulatory Visit: Payer: Self-pay | Admitting: Family Medicine

## 2016-08-04 MED FILL — METHOCARBAMOL 750 MG TABLET: 750 | 30 days supply | Qty: 90 | Fill #0

## 2016-08-04 MED FILL — traMADol HCL 50 MG TABS: 50 | 30 days supply | Qty: 120 | Fill #1

## 2016-08-04 NOTE — Telephone Encounter (Signed)
Ok to refill??      LOV - 08/02/16  LRF - 07/06/15 #90/2

## 2016-08-04 NOTE — Telephone Encounter (Signed)
Pt came in for appt. 

## 2016-08-04 NOTE — Telephone Encounter (Signed)
okay

## 2016-09-06 ENCOUNTER — Encounter: Payer: Self-pay | Admitting: Family Medicine

## 2016-09-06 MED ORDER — METOPROLOL SUCCINATE ER 25 MG PO TB24: ORAL_TABLET | ORAL | 3 refills | Status: DC

## 2016-09-06 MED FILL — traMADol HCL 50 MG TABS: 50 | 30 days supply | Qty: 120 | Fill #2

## 2016-09-07 ENCOUNTER — Other Ambulatory Visit: Payer: Self-pay | Admitting: *Deleted

## 2016-09-07 MED ORDER — AMITRIPTYLINE HCL 50 MG PO TABS: 50.0000 mg | ORAL_TABLET | Freq: Every day | ORAL | 1 refills | Status: DC

## 2016-09-07 MED FILL — METOPROLOL SUCC ER 25 MG TA: 25 | 90 days supply | Qty: 135 | Fill #0

## 2016-09-07 MED FILL — AMITRIPTYLINE HCL 50 MG TAB: 50 | 90 days supply | Qty: 90 | Fill #0

## 2016-10-06 ENCOUNTER — Telehealth: Payer: Self-pay | Admitting: *Deleted

## 2016-10-06 MED ORDER — TRAMADOL HCL 50 MG PO TABS
50.0000 mg | ORAL_TABLET | Freq: Four times a day (QID) | ORAL | 2 refills | Status: DC | PRN
Start: 2016-10-06 — End: 2017-01-05

## 2016-10-06 MED FILL — traMADol HCL 50 MG TABS: 50 | 15 days supply | Qty: 120 | Fill #0

## 2016-10-06 NOTE — Telephone Encounter (Signed)
Medication called to pharmacy. 

## 2016-10-06 NOTE — Telephone Encounter (Signed)
Received fax requesting refill on tramadol.   Ok to refill??  Last office visit 08/02/2016.  Last refill 07/06/2016, #2 refills.

## 2016-10-06 NOTE — Telephone Encounter (Signed)
okay

## 2016-11-02 MED FILL — traMADol HCL 50 MG TABS: 50 | 15 days supply | Qty: 120 | Fill #1

## 2016-11-03 MED FILL — METHOCARBAMOL 750 MG TABLET: 750 | 30 days supply | Qty: 90 | Fill #1

## 2016-11-07 ENCOUNTER — Other Ambulatory Visit: Payer: Self-pay | Admitting: Family Medicine

## 2016-11-07 DIAGNOSIS — K589 Irritable bowel syndrome without diarrhea: Secondary | ICD-10-CM

## 2016-11-07 MED FILL — POLYETHYLENE GLYCOL 3350 PO: 28 days supply | Qty: 1054 | Fill #0

## 2016-12-04 MED FILL — AMITRIPTYLINE HCL 50 MG TAB: 50 | 90 days supply | Qty: 90 | Fill #1

## 2016-12-04 MED FILL — traMADol HCL 50 MG TABS: 50 | 15 days supply | Qty: 120 | Fill #2

## 2016-12-15 MED FILL — METOPROLOL SUCC ER 25 MG TA: 25 | 90 days supply | Qty: 135 | Fill #1

## 2017-01-05 ENCOUNTER — Other Ambulatory Visit: Payer: Self-pay | Admitting: Family Medicine

## 2017-01-05 MED FILL — traMADol HCL 50 MG TABS: 50 | 30 days supply | Qty: 120 | Fill #0

## 2017-01-05 NOTE — Telephone Encounter (Signed)
Last OV 3/14 Last refill 5/18 Ok to refill?

## 2017-01-05 NOTE — Telephone Encounter (Signed)
rx called into pharmacy

## 2017-01-05 NOTE — Telephone Encounter (Signed)
okay

## 2017-01-08 MED FILL — METHOCARBAMOL 750 MG TABLET: 750 | 30 days supply | Qty: 90 | Fill #2

## 2017-01-17 ENCOUNTER — Other Ambulatory Visit: Payer: Self-pay | Admitting: Family Medicine

## 2017-01-17 MED FILL — SUMATRIPTAN SUCC 100 MG TAB: 100 | 30 days supply | Qty: 18 | Fill #0

## 2017-02-05 ENCOUNTER — Encounter: Payer: 59 | Admitting: Family Medicine

## 2017-02-05 MED FILL — traMADol HCL 50 MG TABS: 50 | 30 days supply | Qty: 120 | Fill #1

## 2017-02-09 ENCOUNTER — Encounter: Payer: Self-pay | Admitting: Family Medicine

## 2017-02-14 ENCOUNTER — Encounter: Payer: Self-pay | Admitting: Family Medicine

## 2017-03-05 ENCOUNTER — Other Ambulatory Visit: Payer: Self-pay | Admitting: Family Medicine

## 2017-03-05 MED FILL — traMADol HCL 50 MG TABS: 50 | 30 days supply | Qty: 120 | Fill #2

## 2017-03-05 MED FILL — AMITRIPTYLINE HCL 50 MG TAB: 50 | 90 days supply | Qty: 90 | Fill #0

## 2017-03-06 ENCOUNTER — Other Ambulatory Visit: Payer: Self-pay | Admitting: Family Medicine

## 2017-03-06 MED FILL — METHOCARBAMOL 750 MG TABLET: 750 | 30 days supply | Qty: 90 | Fill #0

## 2017-03-06 NOTE — Telephone Encounter (Signed)
Prescription sent to pharmacy.

## 2017-03-06 NOTE — Telephone Encounter (Signed)
Ok to refill 

## 2017-03-06 NOTE — Telephone Encounter (Signed)
okay

## 2017-03-15 MED FILL — METOPROLOL SUCC ER 25 MG TA: 25 | 90 days supply | Qty: 135 | Fill #2

## 2017-03-21 ENCOUNTER — Other Ambulatory Visit: Payer: Self-pay | Admitting: Family Medicine

## 2017-03-21 ENCOUNTER — Other Ambulatory Visit: Payer: 59

## 2017-03-21 ENCOUNTER — Encounter: Payer: Self-pay | Admitting: *Deleted

## 2017-03-21 DIAGNOSIS — Z0000 Encounter for general adult medical examination without abnormal findings: Secondary | ICD-10-CM

## 2017-03-21 DIAGNOSIS — Z79899 Other long term (current) drug therapy: Secondary | ICD-10-CM | POA: Diagnosis not present

## 2017-03-21 DIAGNOSIS — I10 Essential (primary) hypertension: Secondary | ICD-10-CM | POA: Diagnosis not present

## 2017-03-21 DIAGNOSIS — M797 Fibromyalgia: Secondary | ICD-10-CM

## 2017-03-21 LAB — COMPLETE METABOLIC PANEL WITH GFR
AG RATIO: 1.9 (calc) (ref 1.0–2.5)
ALT: 28 U/L (ref 6–29)
AST: 24 U/L (ref 10–30)
Albumin: 3.9 g/dL (ref 3.6–5.1)
Alkaline phosphatase (APISO): 53 U/L (ref 33–115)
BILIRUBIN TOTAL: 0.3 mg/dL (ref 0.2–1.2)
BUN: 12 mg/dL (ref 7–25)
CALCIUM: 8.7 mg/dL (ref 8.6–10.2)
CHLORIDE: 104 mmol/L (ref 98–110)
CO2: 28 mmol/L (ref 20–32)
Creat: 0.71 mg/dL (ref 0.50–1.10)
GFR, EST NON AFRICAN AMERICAN: 104 mL/min/{1.73_m2} (ref >=60)
GFR, Est African American: 120 mL/min/{1.73_m2} (ref >=60)
GLUCOSE: 97 mg/dL (ref 65–99)
Globulin: 2.1 g/dL (calc) (ref 1.9–3.7)
POTASSIUM: 4.4 mmol/L (ref 3.5–5.3)
Sodium: 139 mmol/L (ref 135–146)
Total Protein: 6 g/dL — ABNORMAL LOW (ref 6.1–8.1)

## 2017-03-21 LAB — CBC WITH DIFFERENTIAL/PLATELET
BASOS ABS: 54 {cells}/uL (ref 0–200)
Basophils Relative: 0.7 %
EOS ABS: 200 {cells}/uL (ref 15–500)
EOS PCT: 2.6 %
HEMATOCRIT: 40.7 % (ref 35.0–45.0)
HEMOGLOBIN: 13.4 g/dL (ref 11.7–15.5)
LYMPHS ABS: 3542 {cells}/uL (ref 850–3900)
MCH: 28.2 pg (ref 27.0–33.0)
MCHC: 32.9 g/dL (ref 32.0–36.0)
MCV: 85.5 fL (ref 80.0–100.0)
MPV: 10.6 fL (ref 7.5–12.5)
Monocytes Relative: 5.7 %
NEUTROS ABS: 3465 {cells}/uL (ref 1500–7800)
NEUTROS PCT: 45 %
Platelets: 253 10*3/uL (ref 140–400)
RBC: 4.76 10*6/uL (ref 3.80–5.10)
RDW: 12.2 % (ref 11.0–15.0)
Total Lymphocyte: 46 %
WBC: 7.7 10*3/uL (ref 3.8–10.8)
WBCMIX: 439 {cells}/uL (ref 200–950)

## 2017-03-21 LAB — LIPID PANEL
CHOL/HDL RATIO: 2.4 (calc) (ref <5.0)
Cholesterol: 154 mg/dL (ref <200)
HDL: 65 mg/dL (ref >50)
LDL Cholesterol (Calc): 72 mg/dL (calc)
NON-HDL CHOLESTEROL (CALC): 89 mg/dL (ref <130)
Triglycerides: 87 mg/dL (ref <150)

## 2017-04-03 ENCOUNTER — Other Ambulatory Visit: Payer: Self-pay | Admitting: Family Medicine

## 2017-04-04 ENCOUNTER — Other Ambulatory Visit: Payer: Self-pay

## 2017-04-04 ENCOUNTER — Ambulatory Visit (INDEPENDENT_AMBULATORY_CARE_PROVIDER_SITE_OTHER): Payer: 59 | Admitting: Family Medicine

## 2017-04-04 ENCOUNTER — Encounter: Payer: Self-pay | Admitting: Family Medicine

## 2017-04-04 VITALS — BP 104/68 | HR 92 | Temp 98.8°F | Resp 14 | Ht 62.0 in | Wt 140.0 lb

## 2017-04-04 DIAGNOSIS — L239 Allergic contact dermatitis, unspecified cause: Secondary | ICD-10-CM

## 2017-04-04 DIAGNOSIS — Z0000 Encounter for general adult medical examination without abnormal findings: Secondary | ICD-10-CM | POA: Diagnosis not present

## 2017-04-04 DIAGNOSIS — R000 Tachycardia, unspecified: Secondary | ICD-10-CM | POA: Diagnosis not present

## 2017-04-04 DIAGNOSIS — M797 Fibromyalgia: Secondary | ICD-10-CM | POA: Diagnosis not present

## 2017-04-04 DIAGNOSIS — I10 Essential (primary) hypertension: Secondary | ICD-10-CM

## 2017-04-04 MED ORDER — LIDOCAINE 5 % EX PTCH: 1.0000 | MEDICATED_PATCH | CUTANEOUS | 1 refills | Status: DC

## 2017-04-04 MED ORDER — TRIAMCINOLONE ACETONIDE 0.1 % EX CREA: 1.0000 "application " | TOPICAL_CREAM | Freq: Two times a day (BID) | CUTANEOUS | 0 refills | Status: DC

## 2017-04-04 MED ORDER — METHOCARBAMOL 750 MG PO TABS: 750.0000 mg | ORAL_TABLET | Freq: Three times a day (TID) | ORAL | 2 refills | Status: DC

## 2017-04-04 MED ORDER — HYDROXYZINE HCL 25 MG PO TABS: 25.0000 mg | ORAL_TABLET | Freq: Three times a day (TID) | ORAL | 2 refills | Status: DC | PRN

## 2017-04-04 MED ORDER — AMITRIPTYLINE HCL 50 MG PO TABS: 50.0000 mg | ORAL_TABLET | Freq: Every day | ORAL | 1 refills | Status: DC

## 2017-04-04 MED FILL — METHOCARBAMOL 750 MG TABLET: 750 | 30 days supply | Qty: 90 | Fill #0

## 2017-04-04 MED FILL — hydrOXYzine HCL 25 MG TABS: 25 | 15 days supply | Qty: 45 | Fill #0

## 2017-04-04 MED FILL — traMADol HCL 50 MG TABS: 50 | 30 days supply | Qty: 120 | Fill #0

## 2017-04-04 NOTE — Assessment & Plan Note (Signed)
He does okay blood pressure controlled heart rate Blood pressure controlled heart rate also looks good No change to metoprolol

## 2017-04-04 NOTE — Progress Notes (Signed)
   Subjective:    Patient ID: Karen Schwartz, female    DOB: Jul 27, 1970, 46 y.o.   MRN: [id number]  Patient presents for CPE (has had labs)  Pt here for CPE  Medications reviewed Has GYN for PAP Smear/Mammogram Immunizations- TDAP UTD, Flu shot at work  Reviewed fasting labs Declines HIV testing   Fibromyalgia- continues on Ultram/Elavil/Robxin / Guaifniaisn protocal  Migraines- uses imitrex as needed, controlled She is also currently on the Quillian Quince fast she is cut out processed sugars and foods and feels much better in general.  Sinus tachycardia- HR maintained on Metoprolol BID   Eczema- on flexural areas, triamcomolone expired     Review Of Systems:  GEN- denies fatigue, fever, weight loss,weakness, recent illness HEENT- denies eye drainage, change in vision, nasal discharge, CVS- denies chest pain, palpitations RESP- denies SOB, cough, wheeze ABD- denies N/V, change in stools, abd pain GU- denies dysuria, hematuria, dribbling, incontinence MSK- denies joint pain, muscle aches, injury Neuro- denies headache, dizziness, syncope, seizure activity       Objective:    BP 104/68   Pulse 92   Temp 98.8 F (37.1 C) (Oral)   Resp 14   Ht 5\' 2"  (1.575 m)   Wt 140 lb (63.5 kg)   SpO2 98%   BMI 25.61 kg/m  GEN- NAD, alert and oriented x3 HEENT- PERRL, EOMI, non injected sclera, pink conjunctiva, MMM, oropharynx clear Neck- Supple, no thyromegaly CVS- RRR, no murmur RESP-CTAB ABD-NABS,soft,NT,ND Skin- eczematous rash bilat anticubital fossa EXT- No edema Pulses- Radial, DP- 2+        Assessment & Plan:      Problem List Items Addressed This Visit      Unprioritized   Tachycardia   Hypertension    He does okay blood pressure controlled heart rate Blood pressure controlled heart rate also looks good No change to metoprolol      Fibromyalgia    Continue current therapy with Elavil Robaxin tramadol Naprosyn.  We will also try Lidoderm patch she is trying  to decrease her use of the tramadol during the day.      Eczema, allergic    TAC cream refilled and atarax for prn itching       Other Visit Diagnoses    Routine general medical examination at a health care facility    -  Primary   CPE done, immunizations UTD, fasting labs look good, pt to schedule GYN/Mammogram       Note: This dictation was prepared with Dragon dictation along with smaller phrase technology. Any transcriptional errors that result from this process are unintentional.

## 2017-04-04 NOTE — Patient Instructions (Addendum)
[facility]- for PAP Smear and Mammogram  Try the lidoderm patches Continue current medications F/U 6 months

## 2017-04-04 NOTE — Telephone Encounter (Signed)
Ok to refill??  Last office visit 08/02/2016. Has appointment today.   Last refill 01/02/2017, #2 refills.

## 2017-04-04 NOTE — Telephone Encounter (Signed)
Okay to refill? 

## 2017-04-04 NOTE — Assessment & Plan Note (Signed)
Continue current therapy with Elavil Robaxin tramadol Naprosyn.  We will also try Lidoderm patch she is trying to decrease her use of the tramadol during the day.

## 2017-04-04 NOTE — Assessment & Plan Note (Signed)
TAC cream refilled and atarax for prn itching

## 2017-04-04 NOTE — Telephone Encounter (Signed)
Medication called to pharmacy. 

## 2017-05-04 MED FILL — traMADol HCL 50 MG TABS: 50 | 30 days supply | Qty: 120 | Fill #1

## 2017-05-07 DIAGNOSIS — Z9889 Other specified postprocedural states: Secondary | ICD-10-CM | POA: Diagnosis not present

## 2017-05-07 DIAGNOSIS — H04123 Dry eye syndrome of bilateral lacrimal glands: Secondary | ICD-10-CM | POA: Diagnosis not present

## 2017-05-07 DIAGNOSIS — H35419 Lattice degeneration of retina, unspecified eye: Secondary | ICD-10-CM | POA: Diagnosis not present

## 2017-05-28 MED FILL — METHOCARBAMOL 750 MG TABS: 750 | 30 days supply | Qty: 90 | Fill #1

## 2017-06-04 MED FILL — traMADol HCL 50 MG TABS: 50 | 30 days supply | Qty: 120 | Fill #2

## 2017-06-04 MED FILL — AMITRIPTYLINE HCL 50 MG TAB: 50 | 90 days supply | Qty: 90 | Fill #1

## 2017-06-12 ENCOUNTER — Encounter: Payer: Self-pay | Admitting: Family Medicine

## 2017-06-12 MED ORDER — KETOCONAZOLE 2 % EX SHAM: 1.0000 "application " | MEDICATED_SHAMPOO | CUTANEOUS | 2 refills | Status: DC

## 2017-06-12 MED FILL — KETOCONAZOLE 2% SHAMPOO: 2 | 30 days supply | Qty: 120 | Fill #0

## 2017-06-12 MED FILL — PREVIDENT 5000 BOOSTER PLUS: 1.1 | 30 days supply | Qty: 100 | Fill #0

## 2017-07-06 ENCOUNTER — Other Ambulatory Visit: Payer: Self-pay | Admitting: Family Medicine

## 2017-07-06 MED FILL — traMADol HCL 50 MG TABS: 50 | 15 days supply | Qty: 120 | Fill #0

## 2017-07-06 NOTE — Telephone Encounter (Signed)
Ok to refill??  Last office visit/ refill 04/04/2017, #2 refills.  

## 2017-07-16 MED FILL — METOPROLOL SUCCINATE ER 25: 25 | 90 days supply | Qty: 135 | Fill #3

## 2017-08-06 MED FILL — traMADol HCL 50 MG TABS: 50 | 15 days supply | Qty: 120 | Fill #1

## 2017-09-03 MED FILL — AMITRIPTYLINE HCL 50 MG TAB: 50 | 90 days supply | Qty: 90 | Fill #0

## 2017-09-07 MED FILL — traMADol HCL 50 MG TABS: 50 | 15 days supply | Qty: 120 | Fill #2

## 2017-09-23 ENCOUNTER — Encounter: Payer: Self-pay | Admitting: Family Medicine

## 2017-10-01 ENCOUNTER — Encounter: Payer: Self-pay | Admitting: Family Medicine

## 2017-10-02 ENCOUNTER — Other Ambulatory Visit: Payer: Self-pay

## 2017-10-02 ENCOUNTER — Ambulatory Visit (INDEPENDENT_AMBULATORY_CARE_PROVIDER_SITE_OTHER): Payer: No Typology Code available for payment source | Admitting: Family Medicine

## 2017-10-02 ENCOUNTER — Encounter: Payer: Self-pay | Admitting: Family Medicine

## 2017-10-02 VITALS — BP 112/62 | HR 78 | Temp 98.5°F | Resp 14 | Ht 62.0 in | Wt 142.0 lb

## 2017-10-02 DIAGNOSIS — R000 Tachycardia, unspecified: Secondary | ICD-10-CM

## 2017-10-02 DIAGNOSIS — I10 Essential (primary) hypertension: Secondary | ICD-10-CM | POA: Diagnosis not present

## 2017-10-02 DIAGNOSIS — I7300 Raynaud's syndrome without gangrene: Secondary | ICD-10-CM | POA: Diagnosis not present

## 2017-10-02 DIAGNOSIS — G43709 Chronic migraine without aura, not intractable, without status migrainosus: Secondary | ICD-10-CM | POA: Diagnosis not present

## 2017-10-02 DIAGNOSIS — M797 Fibromyalgia: Secondary | ICD-10-CM | POA: Diagnosis not present

## 2017-10-02 DIAGNOSIS — G43909 Migraine, unspecified, not intractable, without status migrainosus: Secondary | ICD-10-CM | POA: Insufficient documentation

## 2017-10-02 MED ORDER — HYDROXYZINE HCL 25 MG PO TABS: 25.0000 mg | ORAL_TABLET | Freq: Three times a day (TID) | ORAL | 2 refills | Status: DC | PRN

## 2017-10-02 MED ORDER — METOPROLOL SUCCINATE ER 25 MG PO TB24: ORAL_TABLET | ORAL | 3 refills | Status: DC

## 2017-10-02 MED ORDER — LEVOCETIRIZINE DIHYDROCHLORIDE 5 MG PO TABS: 5.0000 mg | ORAL_TABLET | Freq: Every evening | ORAL | 6 refills | Status: DC

## 2017-10-02 MED ORDER — AMITRIPTYLINE HCL 50 MG PO TABS: 50.0000 mg | ORAL_TABLET | Freq: Every day | ORAL | 1 refills | Status: DC

## 2017-10-02 MED ORDER — TRAMADOL HCL 50 MG PO TABS: 50.0000 mg | ORAL_TABLET | Freq: Four times a day (QID) | ORAL | 2 refills | Status: DC | PRN

## 2017-10-02 MED ORDER — SUMATRIPTAN SUCCINATE 100 MG PO TABS: ORAL_TABLET | ORAL | 3 refills | Status: DC

## 2017-10-02 MED ORDER — METHOCARBAMOL 750 MG PO TABS: 750.0000 mg | ORAL_TABLET | Freq: Three times a day (TID) | ORAL | 2 refills | Status: DC

## 2017-10-02 MED FILL — traMADol HCL 50 MG TABS: 50 | 15 days supply | Qty: 120 | Fill #0

## 2017-10-02 MED FILL — LEVOCETIRIZINE 5 MG TABLET: 5 | 30 days supply | Qty: 30 | Fill #0

## 2017-10-02 MED FILL — hydrOXYzine HCL 25 MG TABS: 25 | 15 days supply | Qty: 45 | Fill #0

## 2017-10-02 MED FILL — METHOCARBAMOL 750 MG TABS: 750 | 30 days supply | Qty: 90 | Fill #0

## 2017-10-02 MED FILL — SUMATRIPTAN SUCC 100 MG TAB: 100 | 30 days supply | Qty: 9 | Fill #0

## 2017-10-02 MED FILL — METOPROLOL SUCCINATE ER 25: 25 | 90 days supply | Qty: 135 | Fill #0

## 2017-10-02 NOTE — Patient Instructions (Addendum)
F/U Physical in Oct/Nov  Continue current medications

## 2017-10-02 NOTE — Assessment & Plan Note (Signed)
She is describing more of a rainouts phenomenon.  Nothing currently on exam.  Discussed things like warm close socks he during her office when she does get cold.  Will not start any other medications that she is already on the metoprolol.  She agrees to this treatment.

## 2017-10-02 NOTE — Progress Notes (Signed)
   Subjective:    Patient ID: Karen Schwartz, female    DOB: [birth year], 47 y.o.   MRN: [id number]  Patient presents for Follow-up (is fasting)   Pt here to f/u chronic medical problems        Fibromyalgia- taking still taking meds as previous , worse pain still on left mid thoracic area, takes ultram and elavil     Tachycardia- taking metoprolol 1 tablet in morning and 1/2 in evening   no palpitations     Migraines- uses imitrex every few months     Notices fingertips turn blue and toes when cold at times is been going on for quite some time.  She typically just warms herself up                            Review Of Systems:  GEN- denies fatigue, fever, weight loss,weakness, recent illness HEENT- denies eye drainage, change in vision, nasal discharge, CVS- denies chest pain, palpitations RESP- denies SOB, cough, wheeze ABD- denies N/V, change in stools, abd pain GU- denies dysuria, hematuria, dribbling, incontinence MSK- denies joint pain, muscle aches, injury Neuro- denies headache, dizziness, syncope, seizure activity       Objective:    BP 112/62   Pulse 78   Temp 98.5 F (36.9 C) (Oral)   Resp 14   Ht 5\' 2"  (1.575 m)   Wt 142 lb (64.4 kg)   SpO2 99%   BMI 25.97 kg/m  GEN- NAD, alert and oriented x3 HEENT- PERRL, EOMI, non injected sclera, pink conjunctiva, MMM, oropharynx clear Neck- Supple, no thyromegaly CVS- RRR, no murmur RESP-CTAB ABD-NABS,soft,NT,ND EXT- No edema Pulses- Radial2+        Assessment & Plan:      Problem List Items Addressed This Visit      Unprioritized   Tachycardia   Raynauds phenomenon    She is describing more of a rainouts phenomenon.  Nothing currently on exam.  Discussed things like warm close socks he during her office when she does get cold.  Will not start any other medications that she is already on the metoprolol.  She agrees to this treatment.      Migraines    PRN Imitrex      Hypertension   Blood pressure controlled tachycardia is controlled.  We did discuss dietary changes she knows when she is on a low sugar diet she does not have problems with her heart rate and often requires less metoprolol.      Fibromyalgia - Primary    See her current medications tramadol and the tricyclic         Note: This dictation was prepared with Dragon dictation along with smaller phrase technology. Any transcriptional errors that result from this process are unintentional.

## 2017-10-02 NOTE — Assessment & Plan Note (Signed)
Blood pressure controlled tachycardia is controlled.  We did discuss dietary changes she knows when she is on a low sugar diet she does not have problems with her heart rate and often requires less metoprolol.

## 2017-10-02 NOTE — Assessment & Plan Note (Signed)
PRN Imitrex

## 2017-10-02 NOTE — Assessment & Plan Note (Signed)
See her current medications tramadol and the tricyclic

## 2017-10-24 ENCOUNTER — Ambulatory Visit: Payer: No Typology Code available for payment source | Admitting: Advanced Practice Midwife

## 2017-10-31 MED FILL — traMADol HCL 50 MG TABS: 50 | 15 days supply | Qty: 120 | Fill #1

## 2017-11-06 ENCOUNTER — Encounter: Payer: Self-pay | Admitting: Advanced Practice Midwife

## 2017-11-06 ENCOUNTER — Ambulatory Visit (INDEPENDENT_AMBULATORY_CARE_PROVIDER_SITE_OTHER): Payer: No Typology Code available for payment source | Admitting: Advanced Practice Midwife

## 2017-11-06 VITALS — BP 104/62 | HR 99 | Ht 62.0 in | Wt 144.0 lb

## 2017-11-06 DIAGNOSIS — Z1231 Encounter for screening mammogram for malignant neoplasm of breast: Secondary | ICD-10-CM | POA: Diagnosis not present

## 2017-11-06 DIAGNOSIS — Z124 Encounter for screening for malignant neoplasm of cervix: Secondary | ICD-10-CM | POA: Diagnosis not present

## 2017-11-06 DIAGNOSIS — Z1239 Encounter for other screening for malignant neoplasm of breast: Secondary | ICD-10-CM

## 2017-11-06 DIAGNOSIS — Z1211 Encounter for screening for malignant neoplasm of colon: Secondary | ICD-10-CM

## 2017-11-06 DIAGNOSIS — Z0000 Encounter for general adult medical examination without abnormal findings: Secondary | ICD-10-CM

## 2017-11-06 DIAGNOSIS — Z01419 Encounter for gynecological examination (general) (routine) without abnormal findings: Secondary | ICD-10-CM

## 2017-11-06 NOTE — Patient Instructions (Signed)
Health Maintenance, Female Adopting a healthy lifestyle and getting preventive care can go a long way to promote health and wellness. Talk with your health care provider about what schedule of regular examinations is right for you. This is a good chance for you to check in with your provider about disease prevention and staying healthy. In between checkups, there are plenty of things you can do on your own. Experts have done a lot of research about which lifestyle changes and preventive measures are most likely to keep you healthy. Ask your health care provider for more information. Weight and diet Eat a healthy diet  Be sure to include plenty of vegetables, fruits, low-fat dairy products, and lean protein.  Do not eat a lot of foods high in solid fats, added sugars, or salt.  Get regular exercise. This is one of the most important things you can do for your health. ? Most adults should exercise for at least 150 minutes each week. The exercise should increase your heart rate and make you sweat (moderate-intensity exercise). ? Most adults should also do strengthening exercises at least twice a week. This is in addition to the moderate-intensity exercise.  Maintain a healthy weight  Body mass index (BMI) is a measurement that can be used to identify possible weight problems. It estimates body fat based on height and weight. Your health care provider can help determine your BMI and help you achieve or maintain a healthy weight.  For females 20 years of age and older: ? A BMI below 18.5 is considered underweight. ? A BMI of 18.5 to 24.9 is normal. ? A BMI of 25 to 29.9 is considered overweight. ? A BMI of 30 and above is considered obese.  Watch levels of cholesterol and blood lipids  You should start having your blood tested for lipids and cholesterol at 46 years of age, then have this test every 5 years.  You may need to have your cholesterol levels checked more often if: ? Your lipid or  cholesterol levels are high. ? You are older than 46 years of age. ? You are at high risk for heart disease.  Cancer screening Lung Cancer  Lung cancer screening is recommended for adults 55-80 years old who are at high risk for lung cancer because of a history of smoking.  A yearly low-dose CT scan of the lungs is recommended for people who: ? Currently smoke. ? Have quit within the past 15 years. ? Have at least a 30-pack-year history of smoking. A pack year is smoking an average of one pack of cigarettes a day for 1 year.  Yearly screening should continue until it has been 15 years since you quit.  Yearly screening should stop if you develop a health problem that would prevent you from having lung cancer treatment.  Breast Cancer  Practice breast self-awareness. This means understanding how your breasts normally appear and feel.  It also means doing regular breast self-exams. Let your health care provider know about any changes, no matter how small.  If you are in your 20s or 30s, you should have a clinical breast exam (CBE) by a health care provider every 1-3 years as part of a regular health exam.  If you are 40 or older, have a CBE every year. Also consider having a breast X-ray (mammogram) every year.  If you have a family history of breast cancer, talk to your health care provider about genetic screening.  If you are at high risk   for breast cancer, talk to your health care provider about having an MRI and a mammogram every year.  Breast cancer gene (BRCA) assessment is recommended for women who have family members with BRCA-related cancers. BRCA-related cancers include: ? Breast. ? Ovarian. ? Tubal. ? Peritoneal cancers.  Results of the assessment will determine the need for genetic counseling and BRCA1 and BRCA2 testing.  Cervical Cancer Your health care provider may recommend that you be screened regularly for cancer of the pelvic organs (ovaries, uterus, and  vagina). This screening involves a pelvic examination, including checking for microscopic changes to the surface of your cervix (Pap test). You may be encouraged to have this screening done every 3 years, beginning at age 22.  For women ages 56-65, health care providers may recommend pelvic exams and Pap testing every 3 years, or they may recommend the Pap and pelvic exam, combined with testing for human papilloma virus (HPV), every 5 years. Some types of HPV increase your risk of cervical cancer. Testing for HPV may also be done on women of any age with unclear Pap test results.  Other health care providers may not recommend any screening for nonpregnant women who are considered low risk for pelvic cancer and who do not have symptoms. Ask your health care provider if a screening pelvic exam is right for you.  If you have had past treatment for cervical cancer or a condition that could lead to cancer, you need Pap tests and screening for cancer for at least 20 years after your treatment. If Pap tests have been discontinued, your risk factors (such as having a new sexual partner) need to be reassessed to determine if screening should resume. Some women have medical problems that increase the chance of getting cervical cancer. In these cases, your health care provider may recommend more frequent screening and Pap tests.  Colorectal Cancer  This type of cancer can be detected and often prevented.  Routine colorectal cancer screening usually begins at 46 years of age and continues through 46 years of age.  Your health care provider may recommend screening at an earlier age if you have risk factors for colon cancer.  Your health care provider may also recommend using home test kits to check for hidden blood in the stool.  A small camera at the end of a tube can be used to examine your colon directly (sigmoidoscopy or colonoscopy). This is done to check for the earliest forms of colorectal  cancer.  Routine screening usually begins at age 33.  Direct examination of the colon should be repeated every 5-10 years through 46 years of age. However, you may need to be screened more often if early forms of precancerous polyps or small growths are found.  Skin Cancer  Check your skin from head to toe regularly.  Tell your health care provider about any new moles or changes in moles, especially if there is a change in a mole's shape or color.  Also tell your health care provider if you have a mole that is larger than the size of a pencil eraser.  Always use sunscreen. Apply sunscreen liberally and repeatedly throughout the day.  Protect yourself by wearing long sleeves, pants, a wide-brimmed hat, and sunglasses whenever you are outside.  Heart disease, diabetes, and high blood pressure  High blood pressure causes heart disease and increases the risk of stroke. High blood pressure is more likely to develop in: ? People who have blood pressure in the high end of  the normal range (130-139/85-89 mm Hg). ? People who are overweight or obese. ? People who are African American.  If you are 21-29 years of age, have your blood pressure checked every 3-5 years. If you are 3 years of age or older, have your blood pressure checked every year. You should have your blood pressure measured twice-once when you are at a hospital or clinic, and once when you are not at a hospital or clinic. Record the average of the two measurements. To check your blood pressure when you are not at a hospital or clinic, you can use: ? An automated blood pressure machine at a pharmacy. ? A home blood pressure monitor.  If you are between 17 years and 37 years old, ask your health care provider if you should take aspirin to prevent strokes.  Have regular diabetes screenings. This involves taking a blood sample to check your fasting blood sugar level. ? If you are at a normal weight and have a low risk for diabetes,  have this test once every three years after 46 years of age. ? If you are overweight and have a high risk for diabetes, consider being tested at a younger age or more often. Preventing infection Hepatitis B  If you have a higher risk for hepatitis B, you should be screened for this virus. You are considered at high risk for hepatitis B if: ? You were born in a country where hepatitis B is common. Ask your health care provider which countries are considered high risk. ? Your parents were born in a high-risk country, and you have not been immunized against hepatitis B (hepatitis B vaccine). ? You have HIV or AIDS. ? You use needles to inject street drugs. ? You live with someone who has hepatitis B. ? You have had sex with someone who has hepatitis B. ? You get hemodialysis treatment. ? You take certain medicines for conditions, including cancer, organ transplantation, and autoimmune conditions.  Hepatitis C  Blood testing is recommended for: ? Everyone born from [birth year] through 1964. ? Anyone with known risk factors for hepatitis C.  Sexually transmitted infections (STIs)  You should be screened for sexually transmitted infections (STIs) including gonorrhea and chlamydia if: ? You are sexually active and are younger than 46 years of age. ? You are older than 46 years of age and your health care provider tells you that you are at risk for this type of infection. ? Your sexual activity has changed since you were last screened and you are at an increased risk for chlamydia or gonorrhea. Ask your health care provider if you are at risk.  If you do not have HIV, but are at risk, it may be recommended that you take a prescription medicine daily to prevent HIV infection. This is called pre-exposure prophylaxis (PrEP). You are considered at risk if: ? You are sexually active and do not regularly use condoms or know the HIV status of your partner(s). ? You take drugs by injection. ? You are  sexually active with a partner who has HIV.  Talk with your health care provider about whether you are at high risk of being infected with HIV. If you choose to begin PrEP, you should first be tested for HIV. You should then be tested every 3 months for as long as you are taking PrEP. Pregnancy  If you are premenopausal and you may become pregnant, ask your health care provider about preconception counseling.  If you may become  pregnant, take 400 to 800 micrograms (mcg) of folic acid every day.  If you want to prevent pregnancy, talk to your health care provider about birth control (contraception). Osteoporosis and menopause  Osteoporosis is a disease in which the bones lose minerals and strength with aging. This can result in serious bone fractures. Your risk for osteoporosis can be identified using a bone density scan.  If you are 65 years of age or older, or if you are at risk for osteoporosis and fractures, ask your health care provider if you should be screened.  Ask your health care provider whether you should take a calcium or vitamin D supplement to lower your risk for osteoporosis.  Menopause may have certain physical symptoms and risks.  Hormone replacement therapy may reduce some of these symptoms and risks. Talk to your health care provider about whether hormone replacement therapy is right for you. Follow these instructions at home:  Schedule regular health, dental, and eye exams.  Stay current with your immunizations.  Do not use any tobacco products including cigarettes, chewing tobacco, or electronic cigarettes.  If you are pregnant, do not drink alcohol.  If you are breastfeeding, limit how much and how often you drink alcohol.  Limit alcohol intake to no more than 1 drink per day for nonpregnant women. One drink equals 12 ounces of beer, 5 ounces of wine, or 1 ounces of hard liquor.  Do not use street drugs.  Do not share needles.  Ask your health care  provider for help if you need support or information about quitting drugs.  Tell your health care provider if you often feel depressed.  Tell your health care provider if you have ever been abused or do not feel safe at home. This information is not intended to replace advice given to you by your health care provider. Make sure you discuss any questions you have with your health care provider. Document Released: 11/21/2010 Document Revised: 10/14/2015 Document Reviewed: 02/09/2015 Elsevier Interactive Patient Education  2018 [employer] Mediterranean Diet A Mediterranean diet refers to food and lifestyle choices that are based on the traditions of countries located on the [place]. This way of eating has been shown to help prevent certain conditions and improve outcomes for people who have chronic diseases, like kidney disease and heart disease. What are tips for following this plan? Lifestyle  Cook and eat meals together with your family, when possible.  Drink enough fluid to keep your urine clear or pale yellow.  Be physically active every day. This includes: ? Aerobic exercise like running or swimming. ? Leisure activities like gardening, walking, or housework.  Get 7-8 hours of sleep each night.  If recommended by your health care provider, drink red wine in moderation. This means 1 glass a day for nonpregnant women and 2 glasses a day for men. A glass of wine equals 5 oz (150 mL). Reading food labels  Check the serving size of packaged foods. For foods such as rice and pasta, the serving size refers to the amount of cooked product, not dry.  Check the total fat in packaged foods. Avoid foods that have saturated fat or trans fats.  Check the ingredients list for added sugars, such as corn syrup. Shopping  At the grocery store, buy most of your food from the areas near the walls of the store. This includes: ? Fresh fruits and vegetables (produce). ? Grains, beans,  nuts, and seeds. Some of these may be available in unpackaged   forms or large amounts (in bulk). ? Fresh seafood. ? Poultry and eggs. ? Low-fat dairy products.  Buy whole ingredients instead of prepackaged foods.  Buy fresh fruits and vegetables in-season from local farmers markets.  Buy frozen fruits and vegetables in resealable bags.  If you do not have access to quality fresh seafood, buy precooked frozen shrimp or canned fish, such as tuna, salmon, or sardines.  Buy small amounts of raw or cooked vegetables, salads, or olives from the deli or salad bar at your store.  Stock your pantry so you always have certain foods on hand, such as olive oil, canned tuna, canned tomatoes, rice, pasta, and beans. Cooking  Cook foods with extra-virgin olive oil instead of using butter or other vegetable oils.  Have meat as a side dish, and have vegetables or grains as your main dish. This means having meat in small portions or adding small amounts of meat to foods like pasta or stew.  Use beans or vegetables instead of meat in common dishes like chili or lasagna.  Experiment with different cooking methods. Try roasting or broiling vegetables instead of steaming or sauteing them.  Add frozen vegetables to soups, stews, pasta, or rice.  Add nuts or seeds for added healthy fat at each meal. You can add these to yogurt, salads, or vegetable dishes.  Marinate fish or vegetables using olive oil, lemon juice, garlic, and fresh herbs. Meal planning  Plan to eat 1 vegetarian meal one day each week. Try to work up to 2 vegetarian meals, if possible.  Eat seafood 2 or more times a week.  Have healthy snacks readily available, such as: ? Vegetable sticks with hummus. ? [place] yogurt. ? Fruit and nut trail mix.  Eat balanced meals throughout the week. This includes: ? Fruit: 2-3 servings a day ? Vegetables: 4-5 servings a day ? Low-fat dairy: 2 servings a day ? Fish, poultry, or lean meat: 1  serving a day ? Beans and legumes: 2 or more servings a week ? Nuts and seeds: 1-2 servings a day ? Whole grains: 6-8 servings a day ? Extra-virgin olive oil: 3-4 servings a day  Limit red meat and sweets to only a few servings a month What are my food choices?  Mediterranean diet ? Recommended ? Grains: Whole-grain pasta. Brown rice. Bulgar wheat. Polenta. Couscous. Whole-wheat bread. [place]. ? Vegetables: Artichokes. Beets. Broccoli. Cabbage. Carrots. Eggplant. Green beans. Chard. Kale. Spinach. Onions. Leeks. Peas. Squash. Tomatoes. Peppers. Radishes. ? Fruits: Apples. Apricots. Avocado. Berries. Bananas. Cherries. Dates. Figs. Grapes. Lemons. Melon. Oranges. Peaches. Plums. Pomegranate. ? Meats and other protein foods: Beans. Almonds. Sunflower seeds. Pine nuts. Peanuts. [place]. Salmon. Scallops. Shrimp. Rice. Tilapia. Clams. Oysters. Eggs. ? Dairy: Low-fat milk. Cheese. Greek yogurt. ? Beverages: Water. Red wine. Herbal tea. ? Fats and oils: Extra virgin olive oil. Avocado oil. Grape seed oil. ? Sweets and desserts: [place] yogurt with honey. Baked apples. Poached pears. Trail mix. ? Seasoning and other foods: Basil. Cilantro. Coriander. Cumin. Mint. Parsley. Sage. Rosemary. Tarragon. Garlic. Oregano. Thyme. Pepper. Balsalmic vinegar. Tahini. Hummus. Tomato sauce. Olives. Mushrooms. ? Limit these ? Grains: Prepackaged pasta or rice dishes. Prepackaged cereal with added sugar. ? Vegetables: Deep fried potatoes (french fries). ? Fruits: Fruit canned in syrup. ? Meats and other protein foods: Beef. Pork. Lamb. Poultry with skin. Hot dogs. [place] Salines. ? Dairy: Ice cream. Sour cream. Whole milk. ? Beverages: Juice. Sugar-sweetened soft drinks. Beer. Liquor and spirits. ? Fats and oils: Butter. Canola oil.  Vegetable oil. Beef fat (tallow). Lard. ? Sweets and desserts: Cookies. Cakes. Pies. Candy. ? Seasoning and other foods: Mayonnaise. Premade sauces and marinades. ? The items listed may  not be a complete list. Talk with your dietitian about what dietary choices are right for you. Summary  The Mediterranean diet includes both food and lifestyle choices.  Eat a variety of fresh fruits and vegetables, beans, nuts, seeds, and whole grains.  Limit the amount of red meat and sweets that you eat.  Talk with your health care provider about whether it is safe for you to drink red wine in moderation. This means 1 glass a day for nonpregnant women and 2 glasses a day for men. A glass of wine equals 5 oz (150 mL). This information is not intended to replace advice given to you by your health care provider. Make sure you discuss any questions you have with your health care provider. Document Released: 12/30/2015 Document Revised: 02/01/2016 Document Reviewed: 12/30/2015 Elsevier Interactive Patient Education  2018 [employer]. American Heart Association (AHA) Exercise Recommendation  Being physically active is important to prevent heart disease and stroke, the nation's No. 1and No. 5killers. To improve overall cardiovascular health, we suggest at least 150 minutes per week of moderate exercise or 75 minutes per week of vigorous exercise (or a combination of moderate and vigorous activity). Thirty minutes a day, five times a week is an easy goal to remember. You will also experience benefits even if you divide your time into two or three segments of 10 to 15 minutes per day.  For people who would benefit from lowering their blood pressure or cholesterol, we recommend 40 minutes of aerobic exercise of moderate to vigorous intensity three to four times a week to lower the risk for heart attack and stroke.  Physical activity is anything that makes you move your body and burn calories.  This includes things like climbing stairs or playing sports. Aerobic exercises benefit your heart, and include walking, jogging, swimming or biking. Strength and stretching exercises are best for overall  stamina and flexibility.  The simplest, positive change you can make to effectively improve your heart health is to start walking. It's enjoyable, free, easy, social and great exercise. A walking program is flexible and boasts high success rates because people can stick with it. It's easy for walking to become a regular and satisfying part of life.   For Overall Cardiovascular Health:  At least 30 minutes of moderate-intensity aerobic activity at least 5 days per week for a total of 150  OR   At least 25 minutes of vigorous aerobic activity at least 3 days per week for a total of 75 minutes; or a combination of moderate- and vigorous-intensity aerobic activity  AND   Moderate- to high-intensity muscle-strengthening activity at least 2 days per week for additional health benefits.  For Lowering Blood Pressure and Cholesterol  An average 40 minutes of moderate- to vigorous-intensity aerobic activity 3 or 4 times per week  What if I can't make it to the time goal? Something is always better than nothing! And everyone has to start somewhere. Even if you've been sedentary for years, today is the day you can begin to make healthy changes in your life. If you don't think you'll make it for 30 or 40 minutes, set a reachable goal for today. You can work up toward your overall goal by increasing your time as you get stronger. Don't let all-or-nothing thinking rob you of doing   what you can every day.  Source:http://www.heart.org    

## 2017-11-06 NOTE — Progress Notes (Signed)
Patient ID: Karen Schwartz, female   DOB: [birth year], 47 y.o.   MRN: [id number]    Gynecology Annual Exam  PCP: Alycia Rossetti, MD  History of Present Illness: Patient is a 47 y.o. No obstetric history on file. presents for annual exam. The patient has no complaints today. She does have chronic fatigue, pain and constipation that she is seen by her PCP for care. Discussion of anti-inflammatory diet and pelvic floor strengthening.  LMP: No LMP recorded. Average Interval: regular, 28 days Duration of flow: 3 days Heavy Menses: no Clots: no Intermenstrual Bleeding: no Postcoital Bleeding: no Dysmenorrhea: no   The patient is sexually active. She currently uses none for contraception. She denies dyspareunia.  The patient does occasionally perform self breast exams.  There is no notable family history of breast or ovarian cancer in her family.  The patient wears seatbelts: yes.   The patient has regular exercise: no.  She states it is difficult due to her chronic fatigue and pain. She does some walking and occasional stairs. She has tried to improve her diet to help with her fatigue. She admits needing to increase H2O intake.   The patient denies current symptoms of depression.  She admits good support system of friends and family.  Review of Systems: Review of Systems  Constitutional: Positive for malaise/fatigue.  HENT: Negative.   Eyes: Negative.   Respiratory: Negative.   Cardiovascular: Negative.   Gastrointestinal: Positive for constipation.  Genitourinary: Negative.   Musculoskeletal: Negative.   Skin: Negative.   Neurological: Negative.   Endo/Heme/Allergies: Negative.   Psychiatric/Behavioral: Negative.     Past Medical History:  Past Medical History:  Diagnosis Date  . Chronic headaches   . Constipation   . Eczema   . Fibromyalgia   . Hypertension    boderline  . IBS (irritable bowel syndrome)     Past Surgical History:  History reviewed. No pertinent  surgical history.  Gynecologic History:  No LMP recorded. Contraception: none Last Pap: 4 years ago Results were:  no abnormalities  Last mammogram: 2 years ago Results were: BI-RAD I  Obstetric History: No obstetric history on file.  Family History:  Family History  Problem Relation Age of Onset  . Hypertension Mother   . Depression Mother   . Cancer Father        bladder cancer, rectal cancer  . Hypertension Sister   . Depression Sister   . Mental illness Brother        suicide  . Alcohol abuse Brother   . Diabetes Maternal Grandmother   . Macular degeneration Maternal Grandmother   . Dementia Maternal Grandfather     Social History:  Social History   Socioeconomic History  . Marital status: Married    Spouse name: Not on file  . Number of children: Not on file  . Years of education: Not on file  . Highest education level: Not on file  Occupational History  . Not on file  Social Needs  . Financial resource strain: Not on file  . Food insecurity:    Worry: Not on file    Inability: Not on file  . Transportation needs:    Medical: Not on file    Non-medical: Not on file  Tobacco Use  . Smoking status: Former Smoker    Packs/day: 1.00    Years: 12.00    Pack years: 12.00    Types: Cigarettes    Last attempt to quit: 05/23/1999  Years since quitting: 18.4  . Smokeless tobacco: Never Used  Substance and Sexual Activity  . Alcohol use: No    Alcohol/week: 0.0 oz    Comment: beer  . Drug use: No  . Sexual activity: Yes    Comment: divorced, remarried, 2 kids, RN  Lifestyle  . Physical activity:    Days per week: Not on file    Minutes per session: Not on file  . Stress: Not on file  Relationships  . Social connections:    Talks on phone: Not on file    Gets together: Not on file    Attends religious service: Not on file    Active member of club or organization: Not on file    Attends meetings of clubs or organizations: Not on file    Relationship  status: Not on file  . Intimate partner violence:    Fear of current or ex partner: Not on file    Emotionally abused: Not on file    Physically abused: Not on file    Forced sexual activity: Not on file  Other Topics Concern  . Not on file  Social History Narrative  . Not on file    Allergies:  Allergies  Allergen Reactions  . Ampicillin   . Latex Itching  . Lyrica [Pregabalin]   . Zantac [Ranitidine Hcl]     Tachycardia    Medications: Prior to Admission medications   Medication Sig Start Date End Date Taking? Authorizing Provider  amitriptyline (ELAVIL) 50 MG tablet Take 1 tablet (50 mg total) by mouth at bedtime. 10/02/17  Yes [place], Modena Nunnery, MD  guaiFENesin (MUCINEX) 600 MG 12 hr tablet Take 600 mg by mouth 2 (two) times daily. PRN   Yes [provider]  hydrOXYzine (ATARAX/VISTARIL) 25 MG tablet Take 1 tablet (25 mg total) by mouth 3 (three) times daily as needed. 10/02/17  Yes [place], Modena Nunnery, MD  methocarbamol (ROBAXIN) 750 MG tablet Take 1 tablet (750 mg total) by mouth 3 (three) times daily. 10/02/17  Yes Seth Ward, Modena Nunnery, MD  naproxen (NAPROSYN) 500 MG tablet Take 500 mg by mouth 2 (two) times daily with a meal.   Yes [provider]  polyethylene glycol powder (GLYCOLAX/MIRALAX) powder MIX 17 GRAMS IN LIQUID OF CHOICE AND DRINK TWICE DAILY AS NEEDED 11/07/16  Yes [place], Modena Nunnery, MD  SUMAtriptan (IMITREX) 100 MG tablet TAKE 1 TABLET BY MOUTH EVERY 2 HOURS AS NEEDED AS DIRECTED 10/02/17  Yes [place], Modena Nunnery, MD  traMADol (ULTRAM) 50 MG tablet Take 1-2 tablets (50-100 mg total) by mouth every 6 (six) hours as needed. for pain 10/02/17  Yes , Modena Nunnery, MD    Physical Exam Vitals: Blood pressure 104/62, pulse 99, height 5\' 2"  (1.575 m), weight 144 lb (65.3 kg), SpO2 99 %.  General: NAD HEENT: normocephalic, anicteric Thyroid: no enlargement, no palpable nodules Pulmonary: No increased work of breathing, CTAB Cardiovascular: RRR, distal  pulses 2+ Breast: Breast symmetrical, no tenderness, no palpable nodules or masses, no skin or nipple retraction present, no nipple discharge.  No axillary or supraclavicular lymphadenopathy. Abdomen: NABS, soft, non-tender, non-distended.  Umbilicus without lesions.  No hepatomegaly, splenomegaly or masses palpable. No evidence of hernia  Genitourinary:  External: Normal external female genitalia.  Normal urethral meatus, normal Bartholin's and Skene's glands.    Vagina: Normal vaginal mucosa, no evidence of prolapse. Decreased pelvic floor muscle tone   Cervix: Grossly normal in appearance, no bleeding, no CMT  Uterus: Non-enlarged,  mobile, normal contour.    Adnexa: ovaries non-enlarged, no adnexal masses  Rectal: deferred  Lymphatic: no evidence of inguinal lymphadenopathy Extremities: no edema, erythema, or tenderness Neurologic: Grossly intact Psychiatric: mood appropriate, affect full   Assessment: 47 y.o. No obstetric history on file. routine annual exam  Plan: Problem List Items Addressed This Visit    None    Visit Diagnoses    Well woman exam with routine gynecological exam    -  Primary   Relevant Orders   IGP, Aptima HPV, rfx 16/18,45   Cervical cancer screening       Relevant Orders   IGP, Aptima HPV, rfx 16/18,45   Screening for breast cancer       Relevant Orders   MM DIGITAL SCREENING BILATERAL   Screen for colon cancer       Relevant Orders   Ambulatory referral to Gastroenterology      1) Mammogram - recommend yearly screening mammogram.  Mammogram Was ordered today   2) STI screening  wasoffered and declined  3) ASCCP guidelines and rational discussed.  Patient opts for every 3-5 years screening interval  4) Contraception - the patient is currently using  none.  She is happy with her current form of contraception and plans to continue  5) Colonoscopy -- Screening recommended starting at age 52 for average risk individuals, age 43 for individuals  deemed at increased risk (including African Americans) and recommended to continue until age 77.  For patient age 23-85 individualized approach is recommended.  Gold standard screening is via colonoscopy, Cologuard screening is an acceptable alternative for patient unwilling or unable to undergo colonoscopy.  "Colorectal cancer screening for average?risk adults: 2018 guideline update from the American Cancer Society"CA: A Cancer Journal for Clinicians: Oct 18, 2016   6) Routine healthcare maintenance including cholesterol, diabetes screening discussed managed by PCP  7) Return in about 1 year (around 11/07/2018) for annual established gyn.   Rod Can, [facility], [facility] 11/06/2017, 1:40 PM

## 2017-11-09 LAB — IGP, APTIMA HPV, RFX 16/18,45
HPV APTIMA: NEGATIVE
PAP Smear Comment: 0

## 2017-11-28 ENCOUNTER — Encounter: Payer: Self-pay | Admitting: *Deleted

## 2017-12-03 MED FILL — AMITRIPTYLINE HCL 50 MG TAB: 50 | 90 days supply | Qty: 90 | Fill #1

## 2017-12-03 MED FILL — traMADol HCL 50 MG TABS: 50 | 15 days supply | Qty: 120 | Fill #2

## 2017-12-11 ENCOUNTER — Encounter: Payer: Self-pay | Admitting: Family Medicine

## 2017-12-11 ENCOUNTER — Encounter: Payer: Self-pay | Admitting: Advanced Practice Midwife

## 2017-12-11 DIAGNOSIS — K5900 Constipation, unspecified: Secondary | ICD-10-CM

## 2017-12-12 ENCOUNTER — Encounter: Payer: Self-pay | Admitting: Gastroenterology

## 2017-12-18 ENCOUNTER — Encounter: Payer: Self-pay | Admitting: Family Medicine

## 2018-01-07 ENCOUNTER — Other Ambulatory Visit: Payer: Self-pay | Admitting: Family Medicine

## 2018-01-07 MED FILL — traMADol HCL 50 MG TABS: 50 | 15 days supply | Qty: 120 | Fill #0

## 2018-01-07 NOTE — Telephone Encounter (Signed)
Ok to refill??   Last office visit/ refill 10/02/2017.

## 2018-01-22 MED FILL — METOPROLOL SUCCINATE ER 25: 25 | 90 days supply | Qty: 135 | Fill #1

## 2018-02-07 ENCOUNTER — Encounter: Payer: Self-pay | Admitting: Gastroenterology

## 2018-02-07 ENCOUNTER — Ambulatory Visit: Payer: No Typology Code available for payment source | Admitting: Gastroenterology

## 2018-02-07 VITALS — BP 112/70 | HR 64 | Ht 62.0 in | Wt 143.1 lb

## 2018-02-07 DIAGNOSIS — R140 Abdominal distension (gaseous): Secondary | ICD-10-CM | POA: Diagnosis not present

## 2018-02-07 DIAGNOSIS — K5909 Other constipation: Secondary | ICD-10-CM | POA: Diagnosis not present

## 2018-02-07 DIAGNOSIS — K581 Irritable bowel syndrome with constipation: Secondary | ICD-10-CM | POA: Diagnosis not present

## 2018-02-07 MED FILL — traMADol HCL 50 MG TABS: 50 | 15 days supply | Qty: 120 | Fill #1

## 2018-02-07 MED FILL — METHOCARBAMOL 750 MG TABS: 750 | 30 days supply | Qty: 90 | Fill #1

## 2018-02-07 NOTE — Progress Notes (Signed)
[facility] VISIT   Primary Care Provider [place], Modena Nunnery, MD [address] [place] [address] [phone]  Referring Provider Alycia Rossetti, MD [address] [place], [place] [address] [phone]  Patient Profile: Karen Schwartz is a 47 y.o. female with a pmh significant for reported IBS, HTN, Fibromyalgia, Ryanaud's, Eczema, Migraines, Chronic Constipation.  The patient presents to the [facility] for an evaluation and management of problem(s) noted below:  Problem List 1. Irritable bowel syndrome with constipation   2. Chronic constipation   3. Bloating     History of Present Illness: This is the patient's first visit to the [facility].  He has dealt with constipation for many years starting in her teens.  She describes in her early 46s undergoing a flexible sigmoidoscopy told that was normal that she needed to be on laxatives for the rest of her life.  The patient describes constipation as going to the bathroom every 4 to 5 days.  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  She does not have any nausea or vomiting even when she gets to around a 4 5.  She takes MiraLAX as needed if this will help her as long as she takes it on a daily basis unless she has diarrheal bowel movements (after she starts going).  If she strained significantly or has a very painful defecation she will notice blood on her toilet paper but is only at the time of her wiping and she has been told that she has hemorrhoids by prior providers.  In the past she tried Linzess and did very well for about 2 months and then for some reason this stopped helping her.  She drinks lots of coffee it may be only 1 bottle of water per day.  From a dietary perspective she is try to cut out dairy to see if you made any difference and is not clear to pass.  She has never had an upper endoscopy or a full colonoscopy.  She is on  nonsteroidals.  Providers had suggested the role of early colon cancer screening at the age of 75 but there is no family history of colon cancer.  GI Review of Systems Positive as above Negative for dysphasia, odynophagia, abdominal pain, jaundice, pruritus, weight loss  Review of Systems General: Generalized fatigue is long-standing, denies fevers/chills/weight loss HEENT: Positive for headaches (long-standing, denies oral lesions Cardiovascular: Denies chest pain Pulmonary: Denies shortness of breath Gastroenterological: See HPI Genitourinary: Denies darkened urine Hematological: Denies easy bruising/bleeding Endocrine: Denies temperature intolerance Psychological: Mood is stable Musculoskeletal: Positive for long-standing muscle pains in her back   Medications Current Outpatient Medications  Medication Sig Dispense Refill  . amitriptyline (ELAVIL) 50 MG tablet Take 1 tablet (50 mg total) by mouth at bedtime. 90 tablet 1  . guaiFENesin (MUCINEX) 600 MG 12 hr tablet Take 600 mg by mouth 2 (two) times daily. PRN    . hydrOXYzine (ATARAX/VISTARIL) 25 MG tablet Take 1 tablet (25 mg total) by mouth 3 (three) times daily as needed. 45 tablet 2  . methocarbamol (ROBAXIN) 750 MG tablet Take 1 tablet (750 mg total) by mouth 3 (three) times daily. 90 tablet 2  . metoprolol tartrate (LOPRESSOR) 25 MG tablet Take 25 mg by mouth daily. Takes 1 tablet in am and 1/2 tablet in pm    . naproxen (NAPROSYN) 500 MG tablet Take 500 mg by  mouth 2 (two) times daily with a meal.    . polyethylene glycol (MIRALAX / GLYCOLAX) packet Take 17 g by mouth daily.    . SUMAtriptan (IMITREX) 100 MG tablet TAKE 1 TABLET BY MOUTH EVERY 2 HOURS AS NEEDED AS DIRECTED 10 tablet 3  . traMADol (ULTRAM) 50 MG tablet TAKE 1-2 TABLETS BY MOUTH EVERY 6 HOURS AS NEEDED. FOR PAIN 120 tablet 2   No current facility-administered medications for this visit.     Allergies Allergies  Allergen Reactions  . Ampicillin   . Latex  Itching  . Lyrica [Pregabalin]   . Zantac [Ranitidine Hcl]     Tachycardia    Histories Past Medical History:  Diagnosis Date  . Chronic headaches   . Constipation   . Eczema   . Fibromyalgia   . Hypertension    boderline  . IBS (irritable bowel syndrome)    History reviewed. No pertinent surgical history. Social History   Socioeconomic History  . Marital status: Married    Spouse name: Not on file  . Number of children: 2  . Years of education: Not on file  . Highest education level: Not on file  Occupational History  . Occupation: Therapist, sports  Social Needs  . Financial resource strain: Not on file  . Food insecurity:    Worry: Not on file    Inability: Not on file  . Transportation needs:    Medical: Not on file    Non-medical: Not on file  Tobacco Use  . Smoking status: Former Smoker    Packs/day: 1.00    Years: 12.00    Pack years: 12.00    Types: Cigarettes    Last attempt to quit: 05/23/1999    Years since quitting: 18.7  . Smokeless tobacco: Never Used  Substance and Sexual Activity  . Alcohol use: No    Alcohol/week: 0.0 standard drinks    Comment: beer  . Drug use: No  . Sexual activity: Yes    Comment: divorced, remarried, 2 kids, RN  Lifestyle  . Physical activity:    Days per week: Not on file    Minutes per session: Not on file  . Stress: Not on file  Relationships  . Social connections:    Talks on phone: Not on file    Gets together: Not on file    Attends religious service: Not on file    Active member of club or organization: Not on file    Attends meetings of clubs or organizations: Not on file    Relationship status: Not on file  . Intimate partner violence:    Fear of current or ex partner: Not on file    Emotionally abused: Not on file    Physically abused: Not on file    Forced sexual activity: Not on file  Other Topics Concern  . Not on file  Social History Narrative  . Not on file   Family History  Problem Relation Age of Onset    . Hypertension Mother   . Depression Mother   . Cancer Father        bladder cancer, rectal cancer  . Hypertension Sister   . Depression Sister   . Mental illness Brother        suicide  . Alcohol abuse Brother   . Diabetes Maternal Grandmother   . Macular degeneration Maternal Grandmother   . Dementia Maternal Grandfather    I have reviewed her medical, social, and family history in detail  and updated the electronic medical record as necessary.    PHYSICAL EXAMINATION  BP 112/70   Pulse 64   Ht 5\' 2"  (1.575 m)   Wt 143 lb 2 oz (64.9 kg)   BMI 26.18 kg/m  Wt Readings from Last 3 Encounters:  02/07/18 143 lb 2 oz (64.9 kg)  11/06/17 144 lb (65.3 kg)  10/02/17 142 lb (64.4 kg)  GEN: NAD, appears stated age, doesn't appear chronically ill PSYCH: Cooperative, without pressured speech EYE: Conjunctivae pink, sclerae anicteric ENT: MMM, without oral ulcers, no erythema or exudates noted NECK: Supple CV: RR without R/Gs  RESP: CTAB posteriorly, without wheezing GI: NABS, soft, NT/ND, without rebound or guarding, no HSM appreciated GU: DRE to the limits of external likely internal hemorrhoids with concern for possible constriction of the anus upon attempt at defecation (concerning for possible dyssynergia), no palpable lesions within the rectal vault otherwise MSK/EXT: No lower extremity edema SKIN: No concerning rashes/lesions NEURO:  Alert & Oriented x 3, no focal deficits   REVIEW OF DATA  I reviewed the following data at the time of this encounter:  GI Procedures and Studies  Reported history of flexible sigmoidoscopy in the 20s  Laboratory Studies  Reviewed in epic  Imaging Studies  No relevant studies to review   ASSESSMENT  Ms. Paul is a 47 y.o. female with a pmh significant for reported IBS, HTN, Fibromyalgia, Ryanaud's, Eczema, Migraines, Chronic Constipation.  The patient is seen today for evaluation and management of:  1. Irritable bowel syndrome with  constipation   2. Chronic constipation   3. Bloating    This is a patient who presents for evaluation of long-standing constipation.  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  At this junction we will work on trying to optimize her laxative use to be able to continue to take MiraLAX on a daily basis but also to significantly increase her oral intake of water to at least 3-4 bottles of water per day while decreasing her high coffee experience/caffeine experience as best she can and see if this can help regulate her symptoms as well.  Is not really interested in trialing any new medications because she has had side effects to other medications which have been trialed in the past and as these are long-standing issues is not sure how much of the difference it would make but she is not opposed to consideration of a short trial of these medicines if necessary.  Of asked her to try her best to continue to do good fiber intake from a dietary perspective or to begin supplementation of this.  We discussed the role of a screening colonoscopy which the [place] has suggested can start at the age of 12 however the [place] preventative task force has not set that guideline as of yet.  She will reach out to her insurance company to see whether a colonoscopy can be pursued at her age based on the ACS recommendations.  Although symptoms are long-standing a diagnostic colonoscopy for evaluation constipation could also be considered to rule out structural etiologies.  I suspect that she may require work-up from an anorectal manometry perspective/balloon motion testing or barium photography to evaluate for pelvic dyssynergia.  We briefly discussed the role of Kegel exercises as well as possible pelvic floor retraining  in  the future.  I would not plan to necessarily send her for pelvic floor retraining unless we could corroborate on anorectal manometry a dyssynergia.  The patient will update [place] on her symptoms in the course of the coming weeks via my chart.  All patient questions were answered, to the best of my ability, and the patient agrees to the aforementioned plan of action with follow-up as indicated.   PLAN  1. Chronic constipation - Increase Water Intake to 3-4 water bottles daily - Continue current regimen of Miralax - Consider Amitiza or Viberzi in the future - May require Anorectal Manometry in future (concern for possible Pelvic Floor Dyssynergia)  2. Irritable bowel syndrome with constipation - Potentially diagnosis but would need other exclusions  3. Bloating - Simethicone or Gas-X PRN   No orders of the defined types were placed in this encounter.   New Prescriptions   No medications on file   Modified Medications   No medications on file    Planned Follow Up: No follow-ups on file.   Justice Britain, MD [facility] Office # [phone]

## 2018-02-07 NOTE — Patient Instructions (Addendum)
Kegel Exercises Kegel exercises help strengthen the muscles that support the rectum, vagina, small intestine, bladder, and uterus. Doing Kegel exercises can help:  Improve bladder and bowel control.  Improve sexual response.  Reduce problems and discomfort during pregnancy.  Kegel exercises involve squeezing your pelvic floor muscles, which are the same muscles you squeeze when you try to stop the flow of urine. The exercises can be done while sitting, standing, or lying down, but it is best to vary your position. Phase 1 exercises 1. Squeeze your pelvic floor muscles tight. You should feel a tight lift in your rectal area. If you are a female, you should also feel a tightness in your vaginal area. Keep your stomach, buttocks, and legs relaxed. 2. Hold the muscles tight for up to 10 seconds. 3. Relax your muscles. Repeat this exercise 50 times a day or as many times as told by your health care provider. Continue to do this exercise for at least 4-6 weeks or for as long as told by your health care provider. This information is not intended to replace advice given to you by your health care provider. Make sure you discuss any questions you have with your health care provider. Document Released: 04/24/2012 Document Revised: 01/01/2016 Document Reviewed: 03/28/2015 Elsevier Interactive Patient Education  [employer].  We have scheduled you for a follow up appointment with Dr Rush Landmark on 03/20/18 at 2:45  Thank you for entrusting me with your care and choosing [facility].  Dr Rush Landmark

## 2018-02-11 ENCOUNTER — Encounter: Payer: Self-pay | Admitting: Gastroenterology

## 2018-02-11 DIAGNOSIS — K5909 Other constipation: Secondary | ICD-10-CM | POA: Insufficient documentation

## 2018-02-11 DIAGNOSIS — R140 Abdominal distension (gaseous): Secondary | ICD-10-CM

## 2018-02-11 HISTORY — DX: Abdominal distension (gaseous): R14.0

## 2018-03-04 MED FILL — AMITRIPTYLINE HCL 50 MG TAB: 50 | 90 days supply | Qty: 90 | Fill #0

## 2018-03-12 MED FILL — traMADol HCL 50 MG TABS: 50 | 15 days supply | Qty: 120 | Fill #2

## 2018-03-20 ENCOUNTER — Ambulatory Visit: Payer: No Typology Code available for payment source | Admitting: Gastroenterology

## 2018-03-22 ENCOUNTER — Other Ambulatory Visit: Payer: Self-pay

## 2018-03-22 ENCOUNTER — Encounter: Payer: Self-pay | Admitting: Family Medicine

## 2018-03-22 ENCOUNTER — Ambulatory Visit (INDEPENDENT_AMBULATORY_CARE_PROVIDER_SITE_OTHER): Payer: No Typology Code available for payment source | Admitting: Family Medicine

## 2018-03-22 ENCOUNTER — Encounter: Payer: No Typology Code available for payment source | Admitting: Family Medicine

## 2018-03-22 VITALS — BP 104/68 | HR 80 | Temp 97.8°F | Resp 14 | Ht 62.0 in | Wt 143.0 lb

## 2018-03-22 DIAGNOSIS — Z13220 Encounter for screening for lipoid disorders: Secondary | ICD-10-CM

## 2018-03-22 DIAGNOSIS — Z0000 Encounter for general adult medical examination without abnormal findings: Secondary | ICD-10-CM

## 2018-03-22 MED ORDER — TRAMADOL HCL 50 MG PO TABS: 50.0000 mg | ORAL_TABLET | Freq: Four times a day (QID) | ORAL | 2 refills | Status: DC | PRN

## 2018-03-22 NOTE — Progress Notes (Signed)
Patient: Karen Schwartz, Female    DOB: 02-Apr-1971, 47 y.o.   MRN: [id number] Visit Date: 03/22/2018  Today's Provider: Delsa Grana, PA-C   Chief Complaint  Patient presents with  . Annual Exam    is fasting   Subjective:    Annual physical exam Karen Schwartz is a 47 y.o. female who presents today for health maintenance and complete physical. She feels fairly well. She reports exercising not frequently. She reports she is sleeping well.  Fibromyalgia - same, has muscle and nerve pain in back, is using about 100 mg tramadol BID for pain management, She wanted to stop it but when she tried to wean off it she became dizzy, so she using and would like refills.  Elavil and melatonin - sleeps ok  No SE with metoprolol -she tends to have racing heart depending on what she eats especially sensitive to sugars and she knows she needs to do a low sugar diet and has done it before and it usually improves her symptoms and she will decrease her metoprolol dose however she is now been recently motivated usually tries very hard around the first of the year.  Really has no side effects to metoprolol no concerns  She has been busy at work and she reports that she may be behind on her mammogram  Goes to GYN for PAP, UTD  Recently went to GI for IBS -she was told to use MiraLAX and increase fluids she reports she has been doing this there is really been no change.  Did ask her about trying Linzess which she has tried in the past but she currently would not like to add any more medications or tried anything more than what she is already doing.  Migraines -still gets about once a month for 2 to 3 days around her cycle and does not always use Imitrex  Got Flu shot at work - [facility] 9/19   Review of Systems  Constitutional: Negative.  Negative for activity change, appetite change, fatigue and unexpected weight change.  HENT: Negative.   Eyes: Negative.   Respiratory: Negative.  Negative for  shortness of breath.   Cardiovascular: Negative.  Negative for chest pain, palpitations and leg swelling.  Gastrointestinal: Negative.  Negative for abdominal pain and blood in stool.  Endocrine: Negative.   Genitourinary: Negative.   Musculoskeletal: Negative.  Negative for arthralgias, gait problem, joint swelling and myalgias.  Skin: Negative.  Negative for color change, pallor and rash.  Allergic/Immunologic: Negative.   Neurological: Negative.  Negative for syncope and weakness.  Hematological: Negative.   Psychiatric/Behavioral: Negative.  Negative for confusion, dysphoric mood, self-injury and suicidal ideas. The patient is not nervous/anxious.     Social History      She  reports that she quit smoking about 18 years ago. Her smoking use included cigarettes. She has a 12.00 pack-year smoking history. She has never used smokeless tobacco. She reports that she does not drink alcohol or use drugs.       Social History   Socioeconomic History  . Marital status: Married    Spouse name: Not on file  . Number of children: 2  . Years of education: Not on file  . Highest education level: Not on file  Occupational History  . Occupation: Therapist, sports  Social Needs  . Financial resource strain: Not on file  . Food insecurity:    Worry: Not on file    Inability: Not on file  . Transportation  needs:    Medical: Not on file    Non-medical: Not on file  Tobacco Use  . Smoking status: Former Smoker    Packs/day: 1.00    Years: 12.00    Pack years: 12.00    Types: Cigarettes    Last attempt to quit: 05/23/1999    Years since quitting: 18.8  . Smokeless tobacco: Never Used  Substance and Sexual Activity  . Alcohol use: No    Alcohol/week: 0.0 standard drinks    Comment: beer  . Drug use: No  . Sexual activity: Yes    Comment: divorced, remarried, 2 kids, RN  Lifestyle  . Physical activity:    Days per week: Not on file    Minutes per session: Not on file  . Stress: Not on file    Relationships  . Social connections:    Talks on phone: Not on file    Gets together: Not on file    Attends religious service: Not on file    Active member of club or organization: Not on file    Attends meetings of clubs or organizations: Not on file    Relationship status: Not on file  Other Topics Concern  . Not on file  Social History Narrative  . Not on file    Past Medical History:  Diagnosis Date  . Chronic headaches   . Constipation   . Eczema   . Fibromyalgia   . Hypertension    boderline  . IBS (irritable bowel syndrome)      Patient Active Problem List   Diagnosis Date Noted  . Chronic constipation 02/11/2018  . Bloating 02/11/2018  . Raynauds phenomenon 10/02/2017  . Migraines 10/02/2017  . IBS (irritable bowel syndrome) 08/03/2016  . Fibromyalgia 08/02/2016  . Tachycardia 08/02/2016  . Eczema, allergic 05/12/2015  . Environmental allergies 05/12/2015  . Hypertension     History reviewed. No pertinent surgical history.  Family History        Family Status  Relation Name Status  . Mother  (Not Specified)  . Father  (Not Specified)  . Sister  (Not Specified)  . Brother  (Not Specified)  . MGM  (Not Specified)  . MGF  (Not Specified)  . Neg Hx  (Not Specified)        Her family history includes Alcohol abuse in her brother; Atrial fibrillation in her father; Cancer in her father; Dementia in her maternal grandfather; Depression in her mother and sister; Diabetes in her maternal grandmother; Hypertension in her mother and sister; Macular degeneration in her maternal grandmother; Mental illness in her brother; Stroke in her mother. There is no history of Colon cancer, Esophageal cancer, Inflammatory bowel disease, Liver disease, Pancreatic cancer, Rectal cancer, or Stomach cancer.      Allergies  Allergen Reactions  . Ampicillin   . Latex Itching  . Lyrica [Pregabalin]   . Zantac [Ranitidine Hcl]     Tachycardia     Current Outpatient  Medications:  .  amitriptyline (ELAVIL) 50 MG tablet, Take 1 tablet (50 mg total) by mouth at bedtime., Disp: 90 tablet, Rfl: 1 .  guaiFENesin (MUCINEX) 600 MG 12 hr tablet, Take 600 mg by mouth 2 (two) times daily. PRN, Disp: , Rfl:  .  hydrOXYzine (ATARAX/VISTARIL) 25 MG tablet, Take 1 tablet (25 mg total) by mouth 3 (three) times daily as needed., Disp: 45 tablet, Rfl: 2 .  methocarbamol (ROBAXIN) 750 MG tablet, Take 1 tablet (750 mg total) by mouth 3 (  three) times daily., Disp: 90 tablet, Rfl: 2 .  metoprolol tartrate (LOPRESSOR) 25 MG tablet, Take 25 mg by mouth daily. Takes 1 tablet in am and 1/2 tablet in pm, Disp: , Rfl:  .  naproxen (NAPROSYN) 500 MG tablet, Take 500 mg by mouth 2 (two) times daily with a meal., Disp: , Rfl:  .  polyethylene glycol (MIRALAX / GLYCOLAX) packet, Take 17 g by mouth daily., Disp: , Rfl:  .  SUMAtriptan (IMITREX) 100 MG tablet, TAKE 1 TABLET BY MOUTH EVERY 2 HOURS AS NEEDED AS DIRECTED, Disp: 10 tablet, Rfl: 3 .  traMADol (ULTRAM) 50 MG tablet, Take 1-2 tablets (50-100 mg total) by mouth every 6 (six) hours as needed. for pain, Disp: 120 tablet, Rfl: 2   Patient Care Team: [place], Modena Nunnery, MD as PCP - General (Family Medicine)      Objective:   Vitals: BP 104/68   Pulse 80   Temp 97.8 F (36.6 C) (Oral)   Resp 14   Ht 5\' 2"  (1.575 m)   Wt 143 lb (64.9 kg)   SpO2 100%   BMI 26.16 kg/m      Physical Exam  Constitutional: She is oriented to person, place, and time. She appears well-developed and well-nourished.  Non-toxic appearance. No distress.  HENT:  Head: Normocephalic and atraumatic.  Right Ear: External ear normal.  Left Ear: External ear normal.  Nose: Nose normal.  Mouth/Throat: Uvula is midline and mucous membranes are normal. No oropharyngeal exudate.  Oral mucosa mildly dry, no pallor  Eyes: Pupils are equal, round, and reactive to light. Conjunctivae, EOM and lids are normal. No scleral icterus.  Neck: Normal range of motion and  phonation normal. Neck supple. No tracheal deviation present. No thyromegaly present.  Cardiovascular: Normal rate, regular rhythm, normal heart sounds, intact distal pulses and normal pulses. Exam reveals no gallop and no friction rub.  No murmur heard. Pulses:      Radial pulses are 2+ on the right side, and 2+ on the left side.       Posterior tibial pulses are 2+ on the right side, and 2+ on the left side.  Pulmonary/Chest: Effort normal and breath sounds normal. No stridor. No respiratory distress. She has no wheezes. She has no rhonchi. She has no rales. She exhibits no tenderness.  Abdominal: Soft. Normal appearance and bowel sounds are normal. She exhibits no distension and no mass. There is no tenderness. There is no rebound and no guarding.  Musculoskeletal: Normal range of motion. She exhibits no edema or deformity.  Lymphadenopathy:    She has no cervical adenopathy.  Neurological: She is alert and oriented to person, place, and time. She exhibits normal muscle tone. Coordination and gait normal.  Skin: Skin is warm, dry and intact. Capillary refill takes less than 2 seconds. No rash noted. She is not diaphoretic. No pallor.  Psychiatric: She has a normal mood and affect. Her speech is normal and behavior is normal. Judgment and thought content normal.  Nursing note and vitals reviewed.    Depression Screen PHQ 2/9 Scores 03/22/2018 10/02/2017 04/04/2017 08/02/2016  PHQ - 2 Score 0 0 0 0  PHQ- 9 Score - - 5 3     Office Visit from 03/22/2018 in [place]  AUDIT-C Score  0         Assessment & Plan:     Routine Health Maintenance and Physical Exam  Exercise Activities and Dietary recommendations   She reports  knowing she needs to work on increased physical activity and  cutting out sugar in her diet. Discussed health benefits of physical activity, and encouraged her to engage in regular exercise appropriate for her age and condition.   Immunization  History  Administered Date(s) Administered  . Hepatitis A 12/02/2009  . Influenza Split 03/05/2013, 02/20/2015  . Influenza-Unspecified 02/19/2014  . Tdap 04/30/2012    Health Maintenance  Topic Date Due  . INFLUENZA VACCINE  12/20/2017  . PAP SMEAR  11/06/2020  . TETANUS/TDAP  04/30/2022  . HIV Screening  Completed   Mammogram ordered and pending, not completed yet (managed by GYN - last GYN well visit was 11/06/17 - Pap done, HPV done and both negative     Problem List Items Addressed This Visit    None    Visit Diagnoses    General medical exam    -  Primary   Relevant Orders   COMPLETE METABOLIC PANEL WITH GFR   Lipid panel   TSH   CBC with Differential/Platelet   Screening for cholesterol level       Relevant Orders   Lipid panel     Going to GI for IBS/constipation  Refill for tramadol for fibromyalgia - chronic pain, pt reports using less that prescribed  Everything else stable, tachycardia, migraines, no side effects of medication, continue current management   Delsa Grana, PA-C 03/22/18 9:16 AM  [facility]

## 2018-03-23 LAB — CBC WITH DIFFERENTIAL/PLATELET
Basophils Absolute: 69 cells/uL (ref 0–200)
Basophils Relative: 0.8 %
Eosinophils Absolute: 198 cells/uL (ref 15–500)
Eosinophils Relative: 2.3 %
HEMATOCRIT: 42.6 % (ref 35.0–45.0)
Hemoglobin: 14.2 g/dL (ref 11.7–15.5)
LYMPHS ABS: 3079 {cells}/uL (ref 850–3900)
MCH: 28.6 pg (ref 27.0–33.0)
MCHC: 33.3 g/dL (ref 32.0–36.0)
MCV: 85.9 fL (ref 80.0–100.0)
MPV: 11 fL (ref 7.5–12.5)
Monocytes Relative: 6.4 %
NEUTROS PCT: 54.7 %
Neutro Abs: 4704 cells/uL (ref 1500–7800)
PLATELETS: 272 10*3/uL (ref 140–400)
RBC: 4.96 10*6/uL (ref 3.80–5.10)
RDW: 12.2 % (ref 11.0–15.0)
TOTAL LYMPHOCYTE: 35.8 %
WBC: 8.6 10*3/uL (ref 3.8–10.8)
WBCMIX: 550 {cells}/uL (ref 200–950)

## 2018-03-23 LAB — COMPLETE METABOLIC PANEL WITH GFR
AG Ratio: 2.3 (calc) (ref 1.0–2.5)
ALKALINE PHOSPHATASE (APISO): 49 U/L (ref 33–115)
ALT: 9 U/L (ref 6–29)
AST: 14 U/L (ref 10–35)
Albumin: 4.2 g/dL (ref 3.6–5.1)
BUN: 10 mg/dL (ref 7–25)
CALCIUM: 8.8 mg/dL (ref 8.6–10.2)
CO2: 26 mmol/L (ref 20–32)
CREATININE: 0.88 mg/dL (ref 0.50–1.10)
Chloride: 104 mmol/L (ref 98–110)
GFR, EST NON AFRICAN AMERICAN: 79 mL/min/{1.73_m2} (ref >=60)
GFR, Est African American: 92 mL/min/{1.73_m2} (ref >=60)
GLUCOSE: 93 mg/dL (ref 65–99)
Globulin: 1.8 g/dL (calc) — ABNORMAL LOW (ref 1.9–3.7)
Potassium: 4.3 mmol/L (ref 3.5–5.3)
Sodium: 139 mmol/L (ref 135–146)
Total Bilirubin: 0.5 mg/dL (ref 0.2–1.2)
Total Protein: 6 g/dL — ABNORMAL LOW (ref 6.1–8.1)

## 2018-03-23 LAB — LIPID PANEL
CHOL/HDL RATIO: 2.6 (calc) (ref <5.0)
CHOLESTEROL: 141 mg/dL (ref <200)
HDL: 55 mg/dL (ref >50)
LDL CHOLESTEROL (CALC): 66 mg/dL
NON-HDL CHOLESTEROL (CALC): 86 mg/dL (ref <130)
Triglycerides: 118 mg/dL (ref <150)

## 2018-03-23 LAB — TSH: TSH: 2.64 mIU/L

## 2018-03-25 ENCOUNTER — Encounter: Payer: Self-pay | Admitting: *Deleted

## 2018-04-10 MED FILL — traMADol HCL 50 MG TABS: 50 | 15 days supply | Qty: 120 | Fill #0

## 2018-04-22 MED FILL — METOPROLOL SUCCINATE ER 25: 25 | 90 days supply | Qty: 135 | Fill #2

## 2018-04-24 MED FILL — PREVIDENT 5000 BOOSTER PLUS: 1.1 | 30 days supply | Qty: 100 | Fill #1

## 2018-05-08 MED FILL — traMADol HCL 50 MG TABS: 50 | 15 days supply | Qty: 120 | Fill #1

## 2018-06-10 MED FILL — AMITRIPTYLINE HCL 50 MG TAB: 50 | 90 days supply | Qty: 90 | Fill #1

## 2018-06-10 MED FILL — SUMAtriptan SUCCINATE 100 M: 100 | 30 days supply | Qty: 9 | Fill #1

## 2018-06-17 MED FILL — traMADol HCL 50 MG TABS: 50 | 15 days supply | Qty: 120 | Fill #2

## 2018-07-17 ENCOUNTER — Other Ambulatory Visit: Payer: Self-pay | Admitting: Family Medicine

## 2018-07-17 MED FILL — traMADol HCL 50 MG TABS: 50 | 15 days supply | Qty: 120 | Fill #0 | Status: TO

## 2018-07-17 NOTE — Telephone Encounter (Signed)
Requesting refill   Tramadol   LOV: 03/22/18  LRF:  03/22/18

## 2018-07-19 ENCOUNTER — Other Ambulatory Visit: Payer: Self-pay | Admitting: Family Medicine

## 2018-07-19 NOTE — Telephone Encounter (Signed)
Ok to refill 

## 2018-07-22 MED FILL — METHOCARBAMOL 750 MG TABS: 750 | 30 days supply | Qty: 90 | Fill #0 | Status: TO

## 2018-07-23 MED FILL — METOPROLOL SUCCINATE ER 25: 25 | 90 days supply | Qty: 135 | Fill #3 | Status: TO

## 2018-08-09 ENCOUNTER — Other Ambulatory Visit: Payer: Self-pay | Admitting: Family Medicine

## 2018-08-12 MED FILL — traMADol HCL 50 MG TABS: 50 | 15 days supply | Qty: 120 | Fill #0

## 2018-08-30 ENCOUNTER — Other Ambulatory Visit: Payer: Self-pay | Admitting: Family Medicine

## 2018-08-30 MED FILL — METHOCARBAMOL 750 MG TABS: 750 | 30 days supply | Qty: 90 | Fill #0 | Status: TO

## 2018-08-30 MED FILL — AMITRIPTYLINE HCL 50 MG TAB: 50 | 90 days supply | Qty: 90 | Fill #0

## 2018-08-30 MED FILL — traMADol HCL 50 MG TABS: 50 | 15 days supply | Qty: 120 | Fill #1

## 2018-10-02 ENCOUNTER — Other Ambulatory Visit: Payer: Self-pay | Admitting: Family Medicine

## 2018-10-02 ENCOUNTER — Encounter: Payer: Self-pay | Admitting: *Deleted

## 2018-10-02 MED FILL — traMADol HCL 50 MG TABS: 50 | 15 days supply | Qty: 120 | Fill #0

## 2018-10-02 NOTE — Telephone Encounter (Signed)
Ok to refill??  Last office visit 03/22/2018.  Last refill 07/17/2018, #2 refills.

## 2018-10-02 NOTE — Telephone Encounter (Signed)
Letter sent.

## 2018-10-02 NOTE — Telephone Encounter (Signed)
Needs OV in in the next month- okay to do telephone visit

## 2018-10-07 ENCOUNTER — Other Ambulatory Visit: Payer: Self-pay

## 2018-10-07 ENCOUNTER — Ambulatory Visit (INDEPENDENT_AMBULATORY_CARE_PROVIDER_SITE_OTHER): Payer: No Typology Code available for payment source | Admitting: Family Medicine

## 2018-10-07 ENCOUNTER — Encounter: Payer: Self-pay | Admitting: Family Medicine

## 2018-10-07 DIAGNOSIS — M797 Fibromyalgia: Secondary | ICD-10-CM

## 2018-10-07 DIAGNOSIS — G43709 Chronic migraine without aura, not intractable, without status migrainosus: Secondary | ICD-10-CM | POA: Diagnosis not present

## 2018-10-07 DIAGNOSIS — I10 Essential (primary) hypertension: Secondary | ICD-10-CM

## 2018-10-07 MED ORDER — METOPROLOL SUCCINATE ER 25 MG PO TB24: ORAL_TABLET | ORAL | 3 refills | Status: DC

## 2018-10-07 MED ORDER — SUMATRIPTAN SUCCINATE 100 MG PO TABS: ORAL_TABLET | ORAL | 3 refills | Status: DC

## 2018-10-07 MED ORDER — AMITRIPTYLINE HCL 50 MG PO TABS: 50.0000 mg | ORAL_TABLET | Freq: Every day | ORAL | 1 refills | Status: DC

## 2018-10-07 MED FILL — SUMATRIPTAN SUCC 100 MG TAB: 100 | 30 days supply | Qty: 9 | Fill #0

## 2018-10-07 MED FILL — METOPROLOL SUCCINATE ER 25: 25 | 90 days supply | Qty: 135 | Fill #0

## 2018-10-07 NOTE — Progress Notes (Signed)
Virtual Visit via Telephone Note  I connected with Karen Schwartz on 10/07/18 at 1:13pm by telephone and verified that I am speaking with the correct person using two identifiers.     Pt location: at home   Physician location:  In office, [facility], Vic Blackbird MD     On call: patient and physician   I discussed the limitations, risks, security and privacy concerns of performing an evaluation and management service by telephone and the availability of in person appointments. I also discussed with the patient that there may be a patient responsible charge related to this service. The patient expressed understanding and agreed to proceed.   History of Present Illness: F/U telephone visit for medications Overall doing well  Seen By her Fibromyalgia doctor in [place] in the winter. Told to avoid artificial dyes as this may affect her chronic fatigue. Continues on mucinex, elavil at bedtime, robaxin for flares  Has not needed the hydroxyzine recently   Ultram 100mg  taking twice a day , when she tries to wean off has severe pain  - uses naprosyn prn    Migraines- only had 1 so far this month, overeall improved, imitrex when severe  HTN- taking metoprolol 130's/ 80's on average, when she cuts down on sugar HR is not as high, and BP    Observations/Objective: NAD note over phone, normal speech, answers questions apprpriately   Assessment and Plan:  Fibromyalgia, chronic fatigue- continue current regimen and chronic pain meds of ultram, elavil, Naprosyn HTN- well controlled, recent labs in November  Migraines- overall doing well, minimal use of imitrex  Meds refilled F/U Nov for CPE  Follow Up Instructions:     I discussed the assessment and treatment plan with the patient. The patient was provided an opportunity to ask questions and all were answered. The patient agreed with the plan and demonstrated an understanding of the instructions.   The patient was advised to  call back or seek an in-person evaluation if the symptoms worsen or if the condition fails to improve as anticipated.  I provided  9 minutes of non-face-to-face time during this encounter. End Time: 1:22pm  Vic Blackbird, MD

## 2018-10-25 MED FILL — METHOCARBAMOL 750 MG TABS: 750 | 30 days supply | Qty: 90 | Fill #1 | Status: TO

## 2018-10-30 MED FILL — traMADol HCL 50 MG TABS: 50 | 15 days supply | Qty: 120 | Fill #1

## 2018-11-22 MED FILL — AMITRIPTYLINE HCL 50 MG TAB: 50 | 90 days supply | Qty: 90 | Fill #0

## 2018-11-25 ENCOUNTER — Other Ambulatory Visit: Payer: Self-pay | Admitting: Family Medicine

## 2018-11-25 ENCOUNTER — Other Ambulatory Visit: Payer: Self-pay | Admitting: *Deleted

## 2018-11-25 MED ORDER — AMITRIPTYLINE HCL 50 MG PO TABS: 50.0000 mg | ORAL_TABLET | Freq: Every day | ORAL | 1 refills | Status: DC

## 2018-11-25 MED FILL — METHOCARBAMOL 750 MG TABS: 750 | 30 days supply | Qty: 90 | Fill #0

## 2018-11-25 MED FILL — traMADol HCL 50 MG TABS: 50 | 15 days supply | Qty: 120 | Fill #0

## 2018-11-25 NOTE — Telephone Encounter (Signed)
Ok to refill Tramadol??  Last office visit 10/07/2018  Last refill 10/02/2018

## 2018-12-09 MED FILL — traMADol HCL 50 MG TABS: 50 | 15 days supply | Qty: 120 | Fill #1

## 2018-12-18 MED FILL — traMADol HCL 50 MG TABS: 50 | 15 days supply | Qty: 120 | Fill #1

## 2018-12-19 MED FILL — METHOCARBAMOL 750 MG TABS: 750 | 30 days supply | Qty: 90 | Fill #1

## 2018-12-27 MED FILL — traMADol HCL 50 MG TABS: 50 | 15 days supply | Qty: 120 | Fill #1

## 2018-12-30 MED FILL — METOPROLOL SUCCINATE ER 25: 25 | 90 days supply | Qty: 135 | Fill #1

## 2019-02-03 ENCOUNTER — Other Ambulatory Visit: Payer: Self-pay | Admitting: Family Medicine

## 2019-02-03 MED FILL — METHOCARBAMOL 750 MG TABS: 750 | 30 days supply | Qty: 90 | Fill #0

## 2019-02-03 MED FILL — traMADol HCL 50 MG TABS: 50 | 15 days supply | Qty: 120 | Fill #0

## 2019-02-03 NOTE — Telephone Encounter (Signed)
Ok to refill??  Last office visit 10/07/2018.  Last refill 11/25/2018, #1 refill.

## 2019-02-14 MED FILL — AMITRIPTYLINE HCL 50 MG TAB: 50 | 90 days supply | Qty: 90 | Fill #1

## 2019-02-28 MED FILL — traMADol HCL 50 MG TABS: 50 | 15 days supply | Qty: 120 | Fill #0

## 2019-03-29 ENCOUNTER — Other Ambulatory Visit: Payer: Self-pay | Admitting: Family Medicine

## 2019-03-31 ENCOUNTER — Other Ambulatory Visit: Payer: Self-pay | Admitting: Family Medicine

## 2019-03-31 MED FILL — METHOCARBAMOL 750 MG TABS: 750 | 30 days supply | Qty: 90 | Fill #0

## 2019-03-31 MED FILL — traMADol HCL 50 MG TABS: 50 | 15 days supply | Qty: 120 | Fill #0

## 2019-03-31 NOTE — Telephone Encounter (Signed)
Requesting refill  Tramadol  LOV: 10/07/18  LRF:  02/03/19

## 2019-03-31 NOTE — Telephone Encounter (Signed)
Last refilled: 11/25/2018 Last visit: 10/07/2018

## 2019-04-01 MED FILL — METOPROLOL SUCCINATE ER 25: 25 | 90 days supply | Qty: 135 | Fill #2

## 2019-04-24 ENCOUNTER — Other Ambulatory Visit: Payer: Self-pay

## 2019-04-25 ENCOUNTER — Ambulatory Visit (INDEPENDENT_AMBULATORY_CARE_PROVIDER_SITE_OTHER): Payer: No Typology Code available for payment source | Admitting: Family Medicine

## 2019-04-25 ENCOUNTER — Encounter: Payer: Self-pay | Admitting: Family Medicine

## 2019-04-25 VITALS — BP 128/68 | HR 60 | Temp 97.9°F | Resp 14 | Ht 62.0 in | Wt 146.0 lb

## 2019-04-25 DIAGNOSIS — I10 Essential (primary) hypertension: Secondary | ICD-10-CM | POA: Diagnosis not present

## 2019-04-25 DIAGNOSIS — Z0001 Encounter for general adult medical examination with abnormal findings: Secondary | ICD-10-CM | POA: Diagnosis not present

## 2019-04-25 DIAGNOSIS — M797 Fibromyalgia: Secondary | ICD-10-CM | POA: Diagnosis not present

## 2019-04-25 DIAGNOSIS — Z1231 Encounter for screening mammogram for malignant neoplasm of breast: Secondary | ICD-10-CM | POA: Diagnosis not present

## 2019-04-25 DIAGNOSIS — B86 Scabies: Secondary | ICD-10-CM

## 2019-04-25 DIAGNOSIS — Z0000 Encounter for general adult medical examination without abnormal findings: Secondary | ICD-10-CM

## 2019-04-25 DIAGNOSIS — L239 Allergic contact dermatitis, unspecified cause: Secondary | ICD-10-CM

## 2019-04-25 MED ORDER — IVERMECTIN 3 MG PO TABS: ORAL_TABLET | ORAL | 0 refills | Status: DC

## 2019-04-25 MED ORDER — TRIAMCINOLONE ACETONIDE 0.1 % EX CREA: 1.0000 "application " | TOPICAL_CREAM | Freq: Two times a day (BID) | CUTANEOUS | 1 refills | Status: DC

## 2019-04-25 MED FILL — IVERMECTIN 3 MG TABLET: 3 | 1 days supply | Qty: 5 | Fill #0

## 2019-04-25 MED FILL — TRIAMCINOLONE 0.1% CREAM: 0.1 | 30 days supply | Qty: 30 | Fill #0

## 2019-04-25 NOTE — Assessment & Plan Note (Signed)
Well controlled, check fasting labs

## 2019-04-25 NOTE — Patient Instructions (Signed)
F/U 6 months

## 2019-04-25 NOTE — Progress Notes (Signed)
   Subjective:    Patient ID: Karen Schwartz, female    DOB: 12-17-1970, 48 y.o.   MRN: [id number]  Patient presents for Annual Exam (is fasting) and Rash (son dx with scabies- has been treated with permethrin x2- has itching now)  Patient here for CPE Medications reviewed Patient has - [facility] her Pap smear is up-to-date 2019 last mammogram   Immunizations tetanus up-to-date influenza  Son was diagnosed with scabies she did self treat with permethrin but still has itching, she treated 2 weeks x 2 doses , she now has itching around her neck, waist band, ankles,has some itching on her hands, she does also have underling eczema but this feels more intense than her eczema    Hypertension-she is taking her metoprolol  25 mg in the morning and 12.5 mg in the evening blood pressure and heart rate has been controlled with this  Fibromyalgia chronic fatigue she continues on her regimen prescribed by her doctor from [place].  She has hydroxyzine, methocarbamol/ naprosyn  and tramadol amitriptyline    Review Of Systems:  GEN- denies fatigue, fever, weight loss,weakness, recent illness HEENT- denies eye drainage, change in vision, nasal discharge, CVS- denies chest pain, palpitations RESP- denies SOB, cough, wheeze ABD- denies N/V, change in stools, abd pain GU- denies dysuria, hematuria, dribbling, incontinence MSK- denies joint pain,+ muscle aches, injury Neuro- denies headache, dizziness, syncope, seizure activity       Objective:    BP 128/68   Pulse 60   Temp 97.9 F (36.6 C) (Temporal)   Resp 14   Ht 5\' 2"  (1.575 m)   Wt 146 lb (66.2 kg)   SpO2 100%   BMI 26.70 kg/m  GEN- NAD, alert and oriented x3 HEENT- PERRL, EOMI, non injected sclera, pink conjunctiva, MMM, oropharynx clear Neck- Supple, no thyromegaly CVS- RRR, no murmur RESP-CTAB ABD-NABS,soft,NT,ND Skin- 3 eczematous spots on neck, erythema right hand base of thumb, no burrow lines in hands,no rash  seen on abd, back, legs  EXT- No edema Pulses- Radial, DP- 2+        Assessment & Plan:      Problem List Items Addressed This Visit      Unprioritized   Eczema, allergic    Concern for residual scabies, her various areas of itching not consistent with her typical eczema Failed topical, given Ivermectin orally  TAC cream for neck lesions which are consistent with her eczema       Fibromyalgia    No change to regimen      Hypertension    Well controlled, check fasting labs      Relevant Orders   Lipid Panel   TSH    Other Visit Diagnoses    Routine general medical examination at a health care facility    -  Primary   CPE done, fasting labs obtained, pt to schedule mammogram   Relevant Orders   CBC with Differential   Comprehensive metabolic panel   Lipid Panel   Encounter for screening mammogram for malignant neoplasm of breast       Relevant Orders   MM SCREENING BREAST TOMO BILATERAL   Scabies          Note: This dictation was prepared with Dragon dictation along with smaller phrase technology. Any transcriptional errors that result from this process are unintentional.

## 2019-04-25 NOTE — Assessment & Plan Note (Signed)
No change to regimen.

## 2019-04-25 NOTE — Assessment & Plan Note (Signed)
Concern for residual scabies, her various areas of itching not consistent with her typical eczema Failed topical, given Ivermectin orally  TAC cream for neck lesions which are consistent with her eczema

## 2019-04-26 LAB — CBC WITH DIFFERENTIAL/PLATELET
Absolute Monocytes: 617 cells/uL (ref 200–950)
Basophils Absolute: 69 cells/uL (ref 0–200)
Basophils Relative: 0.7 %
Eosinophils Absolute: 235 cells/uL (ref 15–500)
Eosinophils Relative: 2.4 %
HCT: 43.4 % (ref 35.0–45.0)
Hemoglobin: 14.3 g/dL (ref 11.7–15.5)
Lymphs Abs: 2930 cells/uL (ref 850–3900)
MCH: 28.7 pg (ref 27.0–33.0)
MCHC: 32.9 g/dL (ref 32.0–36.0)
MCV: 87.1 fL (ref 80.0–100.0)
MPV: 11.2 fL (ref 7.5–12.5)
Monocytes Relative: 6.3 %
Neutro Abs: 5949 cells/uL (ref 1500–7800)
Neutrophils Relative %: 60.7 %
Platelets: 275 10*3/uL (ref 140–400)
RBC: 4.98 10*6/uL (ref 3.80–5.10)
RDW: 12.2 % (ref 11.0–15.0)
Total Lymphocyte: 29.9 %
WBC: 9.8 10*3/uL (ref 3.8–10.8)

## 2019-04-26 LAB — COMPREHENSIVE METABOLIC PANEL
AG Ratio: 2.1 (calc) (ref 1.0–2.5)
ALT: 23 U/L (ref 6–29)
AST: 23 U/L (ref 10–35)
Albumin: 3.9 g/dL (ref 3.6–5.1)
Alkaline phosphatase (APISO): 57 U/L (ref 31–125)
BUN: 14 mg/dL (ref 7–25)
CO2: 28 mmol/L (ref 20–32)
Calcium: 8.9 mg/dL (ref 8.6–10.2)
Chloride: 103 mmol/L (ref 98–110)
Creat: 0.73 mg/dL (ref 0.50–1.10)
Globulin: 1.9 g/dL (calc) (ref 1.9–3.7)
Glucose, Bld: 91 mg/dL (ref 65–99)
Potassium: 4.6 mmol/L (ref 3.5–5.3)
Sodium: 140 mmol/L (ref 135–146)
Total Bilirubin: 0.5 mg/dL (ref 0.2–1.2)
Total Protein: 5.8 g/dL — ABNORMAL LOW (ref 6.1–8.1)

## 2019-04-26 LAB — LIPID PANEL
Cholesterol: 152 mg/dL (ref <200)
HDL: 63 mg/dL (ref >=50)
LDL Cholesterol (Calc): 71 mg/dL (calc)
Non-HDL Cholesterol (Calc): 89 mg/dL (calc) (ref <130)
Total CHOL/HDL Ratio: 2.4 (calc) (ref <5.0)
Triglycerides: 94 mg/dL (ref <150)

## 2019-04-26 LAB — TSH: TSH: 3.02 mIU/L

## 2019-05-03 ENCOUNTER — Other Ambulatory Visit: Payer: Self-pay | Admitting: Family Medicine

## 2019-05-05 NOTE — Telephone Encounter (Signed)
Ok to refill??  Last office visit 04/25/2019.  Last refill 03/31/2019.

## 2019-05-06 MED FILL — traMADol HCL 50 MG TABS: 50 | 15 days supply | Qty: 120 | Fill #0

## 2019-05-08 MED FILL — PREVIDENT 5000 BOOSTER PLUS: 1.1 | 30 days supply | Qty: 100 | Fill #0

## 2019-05-15 MED FILL — AMITRIPTYLINE HCL 50 MG TAB: 50 | 90 days supply | Qty: 90 | Fill #0

## 2019-05-29 MED FILL — PREVIDENT 5000 BOOSTER PLUS: 1.1 | 30 days supply | Qty: 100 | Fill #0

## 2019-06-04 ENCOUNTER — Other Ambulatory Visit: Payer: Self-pay | Admitting: Family Medicine

## 2019-06-04 MED FILL — traMADol HCL 50 MG TABS: 50 | 15 days supply | Qty: 120 | Fill #0

## 2019-06-04 MED FILL — METHOCARBAMOL 750 MG TABS: 750 | 30 days supply | Qty: 90 | Fill #1

## 2019-06-04 NOTE — Telephone Encounter (Signed)
Requested Prescriptions   Pending Prescriptions Disp Refills  . traMADol (ULTRAM) 50 MG tablet [Pharmacy Med Name: traMADol HCL 50 MG TABS 50 Tablet] 120 tablet 0    Sig: TAKE 1 TO 2 TABLETS BY MOUTH EVERY 6 HOURS AS NEEDED FOR PAIN    Last OV 04/25/2019  Last written 05/05/2019

## 2019-06-20 ENCOUNTER — Other Ambulatory Visit: Payer: Self-pay

## 2019-06-20 ENCOUNTER — Ambulatory Visit: Payer: No Typology Code available for payment source

## 2019-06-20 ENCOUNTER — Ambulatory Visit
Admission: RE | Admit: 2019-06-20 | Discharge: 2019-06-20 | Disposition: A | Discharge status: Home / Self Care | Payer: No Typology Code available for payment source | Source: Ambulatory Visit | Attending: Family Medicine | Admitting: Family Medicine

## 2019-06-20 DIAGNOSIS — Z1231 Encounter for screening mammogram for malignant neoplasm of breast: Secondary | ICD-10-CM

## 2019-06-28 MED FILL — METOPROLOL SUCCINATE ER 25: 25 | 90 days supply | Qty: 135 | Fill #3

## 2019-06-30 ENCOUNTER — Other Ambulatory Visit: Payer: Self-pay | Admitting: Family Medicine

## 2019-06-30 DIAGNOSIS — R928 Other abnormal and inconclusive findings on diagnostic imaging of breast: Secondary | ICD-10-CM

## 2019-07-01 ENCOUNTER — Other Ambulatory Visit: Payer: Self-pay | Admitting: Family Medicine

## 2019-07-01 MED FILL — traMADol HCL 50 MG TABS: 50 | 15 days supply | Qty: 120 | Fill #0

## 2019-07-01 NOTE — Telephone Encounter (Signed)
Ok to refill??  Last office visit 04/25/2019.  Last refill 06/04/2019.  Ok to add refills?

## 2019-07-09 ENCOUNTER — Other Ambulatory Visit: Payer: No Typology Code available for payment source

## 2019-07-09 ENCOUNTER — Other Ambulatory Visit: Payer: Self-pay

## 2019-07-09 ENCOUNTER — Ambulatory Visit
Admission: RE | Admit: 2019-07-09 | Discharge: 2019-07-09 | Disposition: A | Discharge status: Home / Self Care | Payer: No Typology Code available for payment source | Source: Ambulatory Visit | Attending: Family Medicine | Admitting: Family Medicine

## 2019-07-09 ENCOUNTER — Other Ambulatory Visit: Payer: Self-pay | Admitting: Family Medicine

## 2019-07-09 DIAGNOSIS — R590 Localized enlarged lymph nodes: Secondary | ICD-10-CM

## 2019-07-09 DIAGNOSIS — R928 Other abnormal and inconclusive findings on diagnostic imaging of breast: Secondary | ICD-10-CM

## 2019-07-30 MED FILL — traMADol HCL 50 MG TABS: 50 | 15 days supply | Qty: 120 | Fill #1

## 2019-08-07 MED FILL — AMITRIPTYLINE HCL 50 MG TAB: 50 | 90 days supply | Qty: 90 | Fill #1

## 2019-08-28 MED FILL — traMADol HCL 50 MG TABS: 50 | 15 days supply | Qty: 120 | Fill #2

## 2019-08-28 MED FILL — METHOCARBAMOL 750 MG TABS: 750 | 30 days supply | Qty: 90 | Fill #2

## 2019-09-23 ENCOUNTER — Other Ambulatory Visit: Payer: Self-pay | Admitting: Family Medicine

## 2019-09-23 MED FILL — traMADol HCL 50 MG TABS: 50 | 15 days supply | Qty: 120 | Fill #0

## 2019-09-23 NOTE — Telephone Encounter (Signed)
Ok to refill??  Last office visit 04/25/2019.  Last refill 07/01/2019, #2 refills.

## 2019-09-26 ENCOUNTER — Other Ambulatory Visit: Payer: Self-pay | Admitting: Family Medicine

## 2019-09-26 MED FILL — METOPROLOL SUCCINATE ER 25: 25 | 90 days supply | Qty: 135 | Fill #0

## 2019-10-10 ENCOUNTER — Other Ambulatory Visit: Payer: Self-pay

## 2019-10-10 ENCOUNTER — Ambulatory Visit
Admission: RE | Admit: 2019-10-10 | Discharge: 2019-10-10 | Disposition: A | Discharge status: Home / Self Care | Payer: No Typology Code available for payment source | Source: Ambulatory Visit | Attending: Family Medicine | Admitting: Family Medicine

## 2019-10-10 ENCOUNTER — Other Ambulatory Visit: Payer: Self-pay | Admitting: Family Medicine

## 2019-10-10 DIAGNOSIS — R590 Localized enlarged lymph nodes: Secondary | ICD-10-CM

## 2019-10-10 DIAGNOSIS — Z1231 Encounter for screening mammogram for malignant neoplasm of breast: Secondary | ICD-10-CM

## 2019-10-19 MED FILL — traMADol HCL 50 MG TABS: 50 | 15 days supply | Qty: 120 | Fill #1

## 2019-10-21 ENCOUNTER — Other Ambulatory Visit: Payer: Self-pay | Admitting: Family Medicine

## 2019-10-21 MED FILL — METHOCARBAMOL 750 MG TABS: 750 | 30 days supply | Qty: 90 | Fill #0

## 2019-10-21 MED FILL — SUMATRIPTAN SUCC 100 MG TAB: 100 | 30 days supply | Qty: 9 | Fill #0

## 2019-11-06 ENCOUNTER — Other Ambulatory Visit: Payer: Self-pay | Admitting: Family Medicine

## 2019-11-06 ENCOUNTER — Other Ambulatory Visit: Payer: Self-pay | Admitting: *Deleted

## 2019-11-06 MED ORDER — AMITRIPTYLINE HCL 50 MG PO TABS: 50.0000 mg | ORAL_TABLET | Freq: Every day | ORAL | 1 refills | Status: DC

## 2019-11-06 MED FILL — AMITRIPTYLINE HCL 50 MG TAB: 50 | 90 days supply | Qty: 90 | Fill #0

## 2019-11-14 MED FILL — traMADol HCL 50 MG TABS: 50 | 15 days supply | Qty: 120 | Fill #2

## 2019-12-12 MED FILL — METHOCARBAMOL 750 MG TABS: 750 | 30 days supply | Qty: 90 | Fill #1

## 2019-12-19 MED FILL — METOPROLOL SUCCINATE ER 25: 25 | 90 days supply | Qty: 135 | Fill #1

## 2019-12-22 ENCOUNTER — Other Ambulatory Visit: Payer: Self-pay | Admitting: Family Medicine

## 2019-12-22 MED FILL — traMADol HCL 50 MG TABS: 50 | 15 days supply | Qty: 120 | Fill #0

## 2019-12-22 NOTE — Telephone Encounter (Signed)
Ok to refill??  Last office visit 04/25/2019.  Last refill 09/23/2019.

## 2020-01-20 MED FILL — traMADol HCL 50 MG TABS: 50 | 15 days supply | Qty: 120 | Fill #1

## 2020-02-21 MED FILL — traMADol HCL 50 MG TABS: 50 | 15 days supply | Qty: 120 | Fill #2

## 2020-02-21 MED FILL — AMITRIPTYLINE HCL 50 MG TAB: 50 | 90 days supply | Qty: 90 | Fill #1

## 2020-03-18 MED FILL — METOPROLOL SUCCINATE ER 25: 25 | 90 days supply | Qty: 135 | Fill #2

## 2020-03-19 ENCOUNTER — Other Ambulatory Visit: Payer: Self-pay | Admitting: Family Medicine

## 2020-03-20 MED FILL — METHOCARBAMOL 750 MG TABS: 750 | 30 days supply | Qty: 90 | Fill #2

## 2020-03-22 NOTE — Telephone Encounter (Signed)
Ok to refill??  Last office visit 04/25/2019.  Last refill 12/22/2019, #2 refills.

## 2020-03-23 ENCOUNTER — Other Ambulatory Visit: Payer: Self-pay | Admitting: Family Medicine

## 2020-03-23 MED FILL — traMADol HCL 50 MG TABS: 50 | 15 days supply | Qty: 120 | Fill #0

## 2020-04-20 ENCOUNTER — Other Ambulatory Visit: Payer: Self-pay | Admitting: Family Medicine

## 2020-04-20 MED FILL — traMADol HCL 50 MG TABS: 50 | 15 days supply | Qty: 120 | Fill #0

## 2020-04-20 NOTE — Telephone Encounter (Signed)
Ok to refill??  Last office visit 04/25/2019.  Last refill 03/23/2020.

## 2020-04-20 NOTE — Telephone Encounter (Signed)
Pt needs OV, needs to be scheduled in Dec or beginning of Jan, in order to continue getting controlled substance

## 2020-04-27 ENCOUNTER — Encounter: Payer: Self-pay | Admitting: Family Medicine

## 2020-04-27 ENCOUNTER — Ambulatory Visit (INDEPENDENT_AMBULATORY_CARE_PROVIDER_SITE_OTHER): Payer: No Typology Code available for payment source | Admitting: Family Medicine

## 2020-04-27 ENCOUNTER — Other Ambulatory Visit: Payer: Self-pay

## 2020-04-27 VITALS — BP 112/66 | HR 84 | Temp 98.7°F | Resp 14 | Ht 62.0 in | Wt 138.0 lb

## 2020-04-27 DIAGNOSIS — M797 Fibromyalgia: Secondary | ICD-10-CM | POA: Diagnosis not present

## 2020-04-27 DIAGNOSIS — E559 Vitamin D deficiency, unspecified: Secondary | ICD-10-CM

## 2020-04-27 DIAGNOSIS — K5909 Other constipation: Secondary | ICD-10-CM | POA: Diagnosis not present

## 2020-04-27 DIAGNOSIS — Z1159 Encounter for screening for other viral diseases: Secondary | ICD-10-CM

## 2020-04-27 DIAGNOSIS — G43709 Chronic migraine without aura, not intractable, without status migrainosus: Secondary | ICD-10-CM

## 2020-04-27 DIAGNOSIS — Z0000 Encounter for general adult medical examination without abnormal findings: Secondary | ICD-10-CM

## 2020-04-27 DIAGNOSIS — Z0001 Encounter for general adult medical examination with abnormal findings: Secondary | ICD-10-CM | POA: Diagnosis not present

## 2020-04-27 DIAGNOSIS — I10 Essential (primary) hypertension: Secondary | ICD-10-CM | POA: Diagnosis not present

## 2020-04-27 NOTE — Progress Notes (Signed)
   Subjective:    Patient ID: Karen Schwartz, female    DOB: 12-10-1970, 49 y.o.   MRN: [id number]  Patient presents for Annual Exam (is fasting) Patient here to follow-up medications.  Her last visit was 1 year ago.  She has history of fibromyalgia chronic pain.  She has been maintained on tramadol She has been doing well  She is eating more plant based  Started a new plant based diet with meal replacement in  November 1st , down 5lbs   Migraine- using elavil and imitrex , these have been controlled  Constipation- miralax prn and more fiber in diet   Has appt with GYN for yearly physical   Mammogram scheduled for Feb   Due for fasting labs  Immunizations UTD   Review Of Systems:  GEN- denies fatigue, fever, weight loss,weakness, recent illness HEENT- denies eye drainage, change in vision, nasal discharge, CVS- denies chest pain, palpitations RESP- denies SOB, cough, wheeze ABD- denies N/V, change in stools, abd pain GU- denies dysuria, hematuria, dribbling, incontinence MSK- denies joint pain, muscle aches, injury Neuro- denies headache, dizziness, syncope, seizure activity       Objective:    BP 112/66   Pulse 84   Temp 98.7 F (37.1 C) (Temporal)   Resp 14   Ht 5\' 2"  (1.575 m)   Wt 138 lb (62.6 kg)   SpO2 98%   BMI 25.24 kg/m  GEN- NAD, alert and oriented x3 HEENT- PERRL, EOMI, non injected sclera, pink conjunctiva, MMM, oropharynx clear Neck- Supple, no thyromegaly CVS- RRR, no murmur RESP-CTAB ABD-NABS,soft,NT,ND EXT- No edema Pulses- Radial, DP- 2+        Assessment & Plan:      Problem List Items Addressed This Visit      Unprioritized   Chronic constipation   Fibromyalgia    Continue ultram Has been on multiple pain regimens        Hypertension    Controlled no changes       Relevant Orders   CBC with Differential/Platelet   Comprehensive metabolic panel   Lipid panel   TSH   Migraines    Doing well on current meds no changes         Other Visit Diagnoses    Routine general medical examination at a health care facility    -  Primary   CPE done, fasting labs obtained, f/u GYN, Immunizations    Vitamin D deficiency       Relevant Orders   Vitamin D, 25-hydroxy   Need for hepatitis C screening test       Relevant Orders   Hepatitis C antibody      Note: This dictation was prepared with Dragon dictation along with smaller phrase technology. Any transcriptional errors that result from this process are unintentional.

## 2020-04-27 NOTE — Assessment & Plan Note (Signed)
Continue ultram Has been on multiple pain regimens

## 2020-04-27 NOTE — Assessment & Plan Note (Signed)
Controlled no changes 

## 2020-04-27 NOTE — Patient Instructions (Addendum)
F/U 6 months with nurse practitioner  Cancel physical for Jan - Put her daughter in the same spot

## 2020-04-27 NOTE — Assessment & Plan Note (Signed)
Doing well on current meds no changes 

## 2020-04-30 ENCOUNTER — Other Ambulatory Visit: Payer: Self-pay | Admitting: *Deleted

## 2020-04-30 DIAGNOSIS — R7989 Other specified abnormal findings of blood chemistry: Secondary | ICD-10-CM

## 2020-04-30 LAB — CBC WITH DIFFERENTIAL/PLATELET
Absolute Monocytes: 580 cells/uL (ref 200–950)
Basophils Absolute: 76 cells/uL (ref 0–200)
Basophils Relative: 0.8 %
Eosinophils Absolute: 238 cells/uL (ref 15–500)
Eosinophils Relative: 2.5 %
HCT: 42.7 % (ref 35.0–45.0)
Hemoglobin: 14.2 g/dL (ref 11.7–15.5)
Lymphs Abs: 3895 cells/uL (ref 850–3900)
MCH: 28.9 pg (ref 27.0–33.0)
MCHC: 33.3 g/dL (ref 32.0–36.0)
MCV: 86.8 fL (ref 80.0–100.0)
MPV: 11.5 fL (ref 7.5–12.5)
Monocytes Relative: 6.1 %
Neutro Abs: 4712 cells/uL (ref 1500–7800)
Neutrophils Relative %: 49.6 %
Platelets: 267 10*3/uL (ref 140–400)
RBC: 4.92 10*6/uL (ref 3.80–5.10)
RDW: 12.6 % (ref 11.0–15.0)
Total Lymphocyte: 41 %
WBC: 9.5 10*3/uL (ref 3.8–10.8)

## 2020-04-30 LAB — VITAMIN D 25 HYDROXY (VIT D DEFICIENCY, FRACTURES): Vit D, 25-Hydroxy: 77 ng/mL (ref 30–100)

## 2020-04-30 LAB — T4, FREE: Free T4: 1.2 ng/dL (ref 0.8–1.8)

## 2020-04-30 LAB — COMPREHENSIVE METABOLIC PANEL
AG Ratio: 1.9 (calc) (ref 1.0–2.5)
ALT: 10 U/L (ref 6–29)
AST: 16 U/L (ref 10–35)
Albumin: 4 g/dL (ref 3.6–5.1)
Alkaline phosphatase (APISO): 46 U/L (ref 31–125)
BUN: 20 mg/dL (ref 7–25)
CO2: 24 mmol/L (ref 20–32)
Calcium: 8.9 mg/dL (ref 8.6–10.2)
Chloride: 102 mmol/L (ref 98–110)
Creat: 0.79 mg/dL (ref 0.50–1.10)
Globulin: 2.1 g/dL (calc) (ref 1.9–3.7)
Glucose, Bld: 80 mg/dL (ref 65–99)
Potassium: 4.3 mmol/L (ref 3.5–5.3)
Sodium: 139 mmol/L (ref 135–146)
Total Bilirubin: 0.3 mg/dL (ref 0.2–1.2)
Total Protein: 6.1 g/dL (ref 6.1–8.1)

## 2020-04-30 LAB — HEPATITIS C ANTIBODY
Hepatitis C Ab: NONREACTIVE
SIGNAL TO CUT-OFF: 0.01 (ref <1.00)

## 2020-04-30 LAB — LIPID PANEL
Cholesterol: 131 mg/dL (ref <200)
HDL: 56 mg/dL (ref >=50)
LDL Cholesterol (Calc): 56 mg/dL (calc)
Non-HDL Cholesterol (Calc): 75 mg/dL (calc) (ref <130)
Total CHOL/HDL Ratio: 2.3 (calc) (ref <5.0)
Triglycerides: 109 mg/dL (ref <150)

## 2020-04-30 LAB — TEST AUTHORIZATION

## 2020-04-30 LAB — T3, FREE: T3, Free: 3.2 pg/mL (ref 2.3–4.2)

## 2020-04-30 LAB — TSH: TSH: 5.83 mIU/L — ABNORMAL HIGH

## 2020-05-03 ENCOUNTER — Encounter: Payer: Self-pay | Admitting: Advanced Practice Midwife

## 2020-05-03 ENCOUNTER — Other Ambulatory Visit: Payer: Self-pay

## 2020-05-03 ENCOUNTER — Ambulatory Visit (INDEPENDENT_AMBULATORY_CARE_PROVIDER_SITE_OTHER): Payer: No Typology Code available for payment source | Admitting: Advanced Practice Midwife

## 2020-05-03 ENCOUNTER — Other Ambulatory Visit (HOSPITAL_COMMUNITY)
Admission: RE | Admit: 2020-05-03 | Discharge: 2020-05-03 | Disposition: A | Discharge status: Home / Self Care | Payer: No Typology Code available for payment source | Source: Ambulatory Visit | Attending: Advanced Practice Midwife | Admitting: Advanced Practice Midwife

## 2020-05-03 VITALS — BP 100/70 | Ht 62.0 in | Wt 139.4 lb

## 2020-05-03 DIAGNOSIS — Z01419 Encounter for gynecological examination (general) (routine) without abnormal findings: Secondary | ICD-10-CM | POA: Insufficient documentation

## 2020-05-03 DIAGNOSIS — N898 Other specified noninflammatory disorders of vagina: Secondary | ICD-10-CM | POA: Diagnosis present

## 2020-05-03 NOTE — Progress Notes (Signed)
Gynecology Annual Exam  PCP: Alycia Rossetti, MD  Chief Complaint:  Chief Complaint  Patient presents with  . Gynecologic Exam    History of Present Illness: Patient is a 49 y.o. G1 P22 female presents for annual exam. The patient has complaint today of vaginal discharge with odor that began a few months ago. She used boric acid suppositories for 5 days with some success. Last week she noticed similar symptoms. She requests testing for BV today. She has ongoing issues with constipation (mega colon) and admits diet helps some. She requests referral for colonoscopy.  LMP: Patient's last menstrual period was 04/19/2020. Average Interval: regular, 28 days Duration of flow: 5-10 days Heavy Menses: 1 day in the middle with light days otherwise Clots: no Intermenstrual Bleeding: no Postcoital Bleeding: no Dysmenorrhea: no   The patient is sexually active. She currently uses none for contraception. She denies dyspareunia.  The patient does perform self breast exams.  There is no notable family history of breast or ovarian cancer in her family.  The patient wears seatbelts: yes.   The patient has regular exercise: none currently. She swam daily during the summer. She reports improving diet- cutting out sugar and now she has less joint pain. She admits adequate hydration and sleep.    The patient denies current symptoms of depression.    Review of Systems: Review of Systems  Constitutional: Positive for malaise/fatigue. Negative for chills and fever.  HENT: Negative for congestion, ear discharge, ear pain, hearing loss, sinus pain and sore throat.   Eyes: Negative for blurred vision and double vision.  Respiratory: Negative for cough, shortness of breath and wheezing.   Cardiovascular: Negative for chest pain, palpitations and leg swelling.  Gastrointestinal: Negative for abdominal pain, blood in stool, constipation, diarrhea, heartburn, melena, nausea and vomiting.  Genitourinary:  Negative for dysuria, flank pain, frequency, hematuria and urgency.       Positive for vaginal discharge and odor  Musculoskeletal: Positive for back pain. Negative for joint pain and myalgias.  Skin: Negative for itching and rash.  Neurological: Negative for dizziness, tingling, tremors, sensory change, speech change, focal weakness, seizures, loss of consciousness, weakness and headaches.  Endo/Heme/Allergies: Negative for environmental allergies. Does not bruise/bleed easily.  Psychiatric/Behavioral: Negative for depression, hallucinations, memory loss, substance abuse and suicidal ideas. The patient is not nervous/anxious and does not have insomnia.     Past Medical History:  Patient Active Problem List   Diagnosis Date Noted  . Chronic constipation 02/11/2018  . Bloating 02/11/2018  . Raynauds phenomenon 10/02/2017  . Migraines 10/02/2017  . IBS (irritable bowel syndrome) 08/03/2016  . Fibromyalgia 08/02/2016    Side effects with Cymbalta and Lyrica  (mood changes ), Pamelor(Caused tachycardia) Currently on Elavil, robaxin, Ultram, Naprosyn   . Tachycardia 08/02/2016  . Eczema, allergic 05/12/2015  . Environmental allergies 05/12/2015  . Hypertension     boderline     Past Surgical History:  History reviewed. No pertinent surgical history.  Gynecologic History:  Patient's last menstrual period was 04/19/2020. Contraception: none Last Pap: 2 years ago Results were:  no abnormalities  Last mammogram: 10 months ago Results were: BI-RAD III, probably benign  Obstetric History: G1 P1  Family History:  Family History  Problem Relation Age of Onset  . Hypertension Mother   . Depression Mother   . Stroke Mother   . Cancer Father        bladder cancer, rectal cancer  . Atrial fibrillation Father   .  Hypertension Sister   . Depression Sister   . Mental illness Brother        suicide  . Alcohol abuse Brother   . Diabetes Maternal Grandmother   . Macular degeneration  Maternal Grandmother   . Dementia Maternal Grandfather   . Colon cancer Neg Hx   . Esophageal cancer Neg Hx   . Inflammatory bowel disease Neg Hx   . Liver disease Neg Hx   . Pancreatic cancer Neg Hx   . Rectal cancer Neg Hx   . Stomach cancer Neg Hx     Social History:  Social History   Socioeconomic History  . Marital status: Married    Spouse name: Not on file  . Number of children: 2  . Years of education: Not on file  . Highest education level: Not on file  Occupational History  . Occupation: Therapist, sports  Tobacco Use  . Smoking status: Former Smoker    Packs/day: 1.00    Years: 12.00    Pack years: 12.00    Types: Cigarettes    Quit date: 05/23/1999    Years since quitting: 20.9  . Smokeless tobacco: Never Used  Vaping Use  . Vaping Use: Never used  Substance and Sexual Activity  . Alcohol use: No    Alcohol/week: 0.0 standard drinks    Comment: beer  . Drug use: No  . Sexual activity: Yes    Comment: divorced, remarried, 2 kids, RN  Other Topics Concern  . Not on file  Social History Narrative  . Not on file   Social Determinants of Health   Financial Resource Strain: Not on file  Food Insecurity: Not on file  Transportation Needs: Not on file  Physical Activity: Not on file  Stress: Not on file  Social Connections: Not on file  Intimate Partner Violence: Not on file    Allergies:  Allergies  Allergen Reactions  . Ampicillin   . Latex Itching  . Lyrica [Pregabalin]   . Zantac [Ranitidine Hcl]     Tachycardia    Medications: Prior to Admission medications   Medication Sig Start Date End Date Taking? Authorizing Provider  amitriptyline (ELAVIL) 50 MG tablet Take 1 tablet (50 mg total) by mouth at bedtime. 11/06/19  Yes Clayton, Modena Nunnery, MD  guaiFENesin (MUCINEX) 600 MG 12 hr tablet Take 600 mg by mouth 2 (two) times daily. PRN   Yes [provider]  hydrOXYzine (ATARAX/VISTARIL) 25 MG tablet Take 1 tablet (25 mg total) by mouth 3 (three) times  daily as needed. 10/02/17  Yes [place], Modena Nunnery, MD  methocarbamol (ROBAXIN) 750 MG tablet TAKE 1 TABLET BY MOUTH 3 TIMES DAILY 10/21/19  Yes [place], Modena Nunnery, MD  metoprolol succinate (TOPROL-XL) 25 MG 24 hr tablet TAKE 1/2 TABLET BY MOUTH EVERY MORNING AND 1 TABLET BY MOUTH EVERY EVENING 09/26/19  Yes [place], Modena Nunnery, MD  naproxen (NAPROSYN) 500 MG tablet Take 500 mg by mouth 2 (two) times daily with a meal.   Yes [provider]  polyethylene glycol (MIRALAX / GLYCOLAX) packet Take 17 g by mouth daily.   Yes [provider]  SUMAtriptan (IMITREX) 100 MG tablet TAKE 1 TABLET BY MOUTH EVERY 2 HOURS AS NEEDED AS DIRECTED 10/21/19  Yes [place], Modena Nunnery, MD  traMADol (ULTRAM) 50 MG tablet TAKE 1 TO 2 TABLETS BY MOUTH EVERY 6 HOURS AS NEEDED FOR PAIN (NEED OFFICE VISIT) 04/20/20  Yes , Modena Nunnery, MD  triamcinolone cream (KENALOG) 0.1 % Apply  1 application topically 2 (two) times daily. 04/25/19  Yes Dubberly, Modena Nunnery, MD    Physical Exam Vitals: Blood pressure 100/70, height 5\' 2"  (1.575 m), weight 139 lb 6.4 oz (63.2 kg), last menstrual period 04/19/2020.  General: NAD HEENT: normocephalic, anicteric Thyroid: no enlargement, no palpable nodules Pulmonary: No increased work of breathing, CTAB Cardiovascular: RRR, distal pulses 2+ Breast: Breast symmetrical, no tenderness, no palpable nodules or masses, no skin or nipple retraction present, no nipple discharge.  No axillary or supraclavicular lymphadenopathy. Abdomen: NABS, soft, non-tender, non-distended.  Umbilicus without lesions.  No hepatomegaly, splenomegaly or masses palpable. No evidence of hernia  Genitourinary:  External: Normal external female genitalia.  Normal urethral meatus, normal Bartholin's and Skene's glands.    Vagina: Normal vaginal mucosa, no evidence of prolapse, aptima swab collected   Cervix: not evaluated  Uterus: deferred  Adnexa: deferred  Rectal: deferred  Lymphatic: no evidence of inguinal  lymphadenopathy Extremities: no edema, erythema, or tenderness Neurologic: Grossly intact Psychiatric: mood appropriate, affect full   Assessment: 49 y.o. G1 P1 routine annual exam  Plan: Problem List Items Addressed This Visit   None   Visit Diagnoses    Well woman exam with routine gynecological exam    -  Primary   Relevant Orders   Cervicovaginal ancillary only   Vaginal odor       Relevant Orders   Cervicovaginal ancillary only   Vaginal discharge       Relevant Orders   Cervicovaginal ancillary only      1) Mammogram - recommend yearly screening mammogram.  Mammogram is scheduled per PCP  2) STI screening  was offered and declined  3) ASCCP guidelines and rationale discussed.  Patient opts for every 3 years screening interval. PAP due next year.  4) Contraception - the patient is currently using  none.  She is happy with her current form of contraception and plans to continue  5) Colonoscopy was ordered today -- Screening recommended starting at age 25 for average risk individuals, age 33 for individuals deemed at increased risk (including African Americans) and recommended to continue until age 64.  For patient age 74-85 individualized approach is recommended.  Gold standard screening is via colonoscopy, Cologuard screening is an acceptable alternative for patient unwilling or unable to undergo colonoscopy.  "Colorectal cancer screening for average?risk adults: 2018 guideline update from the American Cancer Society"CA: A Cancer Journal for Clinicians: Oct 18, 2016   6) Routine healthcare maintenance including cholesterol, diabetes screening discussed managed by PCP  7) Return in about 1 year (around 05/03/2021) for annual established gyn.   Rod Can, [facility] 05/03/2020, 4:29 PM

## 2020-05-03 NOTE — Patient Instructions (Signed)
Health Maintenance, Female Adopting a healthy lifestyle and getting preventive care are important in promoting health and wellness. Ask your health care provider about:  The right schedule for you to have regular tests and exams.  Things you can do on your own to prevent diseases and keep yourself healthy. What should I know about diet, weight, and exercise? Eat a healthy diet   Eat a diet that includes plenty of vegetables, fruits, low-fat dairy products, and lean protein.  Do not eat a lot of foods that are high in solid fats, added sugars, or sodium. Maintain a healthy weight Body mass index (BMI) is used to identify weight problems. It estimates body fat based on height and weight. Your health care provider can help determine your BMI and help you achieve or maintain a healthy weight. Get regular exercise Get regular exercise. This is one of the most important things you can do for your health. Most adults should:  Exercise for at least 150 minutes each week. The exercise should increase your heart rate and make you sweat (moderate-intensity exercise).  Do strengthening exercises at least twice a week. This is in addition to the moderate-intensity exercise.  Spend less time sitting. Even light physical activity can be beneficial. Watch cholesterol and blood lipids Have your blood tested for lipids and cholesterol at 48 years of age, then have this test every 5 years. Have your cholesterol levels checked more often if:  Your lipid or cholesterol levels are high.  You are older than 48 years of age.  You are at high risk for heart disease. What should I know about cancer screening? Depending on your health history and family history, you may need to have cancer screening at various ages. This may include screening for:  Breast cancer.  Cervical cancer.  Colorectal cancer.  Skin cancer.  Lung cancer. What should I know about heart disease, diabetes, and high blood  pressure? Blood pressure and heart disease  High blood pressure causes heart disease and increases the risk of stroke. This is more likely to develop in people who have high blood pressure readings, are of African descent, or are overweight.  Have your blood pressure checked: ? Every 3-5 years if you are 18-39 years of age. ? Every year if you are 40 years old or older. Diabetes Have regular diabetes screenings. This checks your fasting blood sugar level. Have the screening done:  Once every three years after age 40 if you are at a normal weight and have a low risk for diabetes.  More often and at a younger age if you are overweight or have a high risk for diabetes. What should I know about preventing infection? Hepatitis B If you have a higher risk for hepatitis B, you should be screened for this virus. Talk with your health care provider to find out if you are at risk for hepatitis B infection. Hepatitis C Testing is recommended for:  Everyone born from 1945 through 1965.  Anyone with known risk factors for hepatitis C. Sexually transmitted infections (STIs)  Get screened for STIs, including gonorrhea and chlamydia, if: ? You are sexually active and are younger than 48 years of age. ? You are older than 48 years of age and your health care provider tells you that you are at risk for this type of infection. ? Your sexual activity has changed since you were last screened, and you are at increased risk for chlamydia or gonorrhea. Ask your health care provider if   you are at risk.  Ask your health care provider about whether you are at high risk for HIV. Your health care provider may recommend a prescription medicine to help prevent HIV infection. If you choose to take medicine to prevent HIV, you should first get tested for HIV. You should then be tested every 3 months for as long as you are taking the medicine. Pregnancy  If you are about to stop having your period (premenopausal) and  you may become pregnant, seek counseling before you get pregnant.  Take 400 to 800 micrograms (mcg) of folic acid every day if you become pregnant.  Ask for birth control (contraception) if you want to prevent pregnancy. Osteoporosis and menopause Osteoporosis is a disease in which the bones lose minerals and strength with aging. This can result in bone fractures. If you are 65 years old or older, or if you are at risk for osteoporosis and fractures, ask your health care provider if you should:  Be screened for bone loss.  Take a calcium or vitamin D supplement to lower your risk of fractures.  Be given hormone replacement therapy (HRT) to treat symptoms of menopause. Follow these instructions at home: Lifestyle  Do not use any products that contain nicotine or tobacco, such as cigarettes, e-cigarettes, and chewing tobacco. If you need help quitting, ask your health care provider.  Do not use street drugs.  Do not share needles.  Ask your health care provider for help if you need support or information about quitting drugs. Alcohol use  Do not drink alcohol if: ? Your health care provider tells you not to drink. ? You are pregnant, may be pregnant, or are planning to become pregnant.  If you drink alcohol: ? Limit how much you use to 0-1 drink a day. ? Limit intake if you are breastfeeding.  Be aware of how much alcohol is in your drink. In the [place] one drink equals one 12 oz bottle of beer (355 mL), one 5 oz glass of wine (148 mL), or one 1 oz glass of hard liquor (44 mL). General instructions  Schedule regular health, dental, and eye exams.  Stay current with your vaccines.  Tell your health care provider if: ? You often feel depressed. ? You have ever been abused or do not feel safe at home. Summary  Adopting a healthy lifestyle and getting preventive care are important in promoting health and wellness.  Follow your health care provider's instructions about healthy  diet, exercising, and getting tested or screened for diseases.  Follow your health care provider's instructions on monitoring your cholesterol and blood pressure. This information is not intended to replace advice given to you by your health care provider. Make sure you discuss any questions you have with your health care provider. Document Revised: 05/01/2018 Document Reviewed: 05/01/2018 Elsevier Patient Education  2020 Elsevier Inc.  

## 2020-05-03 NOTE — Progress Notes (Signed)
Annual exam

## 2020-05-05 LAB — CERVICOVAGINAL ANCILLARY ONLY
Bacterial Vaginitis (gardnerella): NEGATIVE
Candida Glabrata: NEGATIVE
Candida Vaginitis: NEGATIVE
Comment: NEGATIVE
Comment: NEGATIVE
Comment: NEGATIVE

## 2020-05-24 ENCOUNTER — Other Ambulatory Visit: Payer: Self-pay | Admitting: Family Medicine

## 2020-05-24 NOTE — Telephone Encounter (Signed)
Ok to refill??  Last office visit 04/27/2020.  Last refill on Tramadol 04/20/2020.  Last refill on Robaxin 10/21/2019.

## 2020-05-25 ENCOUNTER — Other Ambulatory Visit: Payer: Self-pay | Admitting: Family Medicine

## 2020-05-25 MED FILL — METHOCARBAMOL 750 MG TABS: 750 | 30 days supply | Qty: 90 | Fill #0

## 2020-05-25 MED FILL — traMADol HCL 50 MG TABS: 50 | 15 days supply | Qty: 120 | Fill #0

## 2020-05-28 ENCOUNTER — Other Ambulatory Visit: Payer: Self-pay | Admitting: Family Medicine

## 2020-05-28 MED FILL — AMITRIPTYLINE HCL 50 MG TAB: 50 | 90 days supply | Qty: 90 | Fill #0

## 2020-06-07 ENCOUNTER — Encounter: Payer: No Typology Code available for payment source | Admitting: Family Medicine

## 2020-06-16 MED FILL — METOPROLOL SUCCINATE ER 25: 25 | 90 days supply | Qty: 135 | Fill #3

## 2020-06-23 ENCOUNTER — Other Ambulatory Visit: Payer: Self-pay | Admitting: Family Medicine

## 2020-06-24 ENCOUNTER — Encounter: Payer: Self-pay | Admitting: Family Medicine

## 2020-06-24 NOTE — Telephone Encounter (Signed)
Ok to refill??  Last office visit 04/27/2020.  Last refill 05/25/2020.

## 2020-06-25 ENCOUNTER — Other Ambulatory Visit: Payer: Self-pay | Admitting: Family Medicine

## 2020-06-25 MED ORDER — TRAMADOL HCL 50 MG PO TABS: 50.0000 mg | ORAL_TABLET | Freq: Four times a day (QID) | ORAL | 0 refills | Status: DC | PRN

## 2020-06-25 MED ORDER — TRAMADOL HCL 50 MG PO TABS: 50.0000 mg | ORAL_TABLET | Freq: Four times a day (QID) | ORAL | 1 refills | Status: DC | PRN

## 2020-06-25 MED FILL — traMADol HCL 50 MG TABS: 50 | 15 days supply | Qty: 120 | Fill #0

## 2020-06-25 NOTE — Addendum Note (Signed)
Addended by: Sheral Flow on: 06/25/2020 08:22 AM   Modules accepted: Orders

## 2020-06-25 NOTE — Telephone Encounter (Signed)
Patient requesting refill sent to [facility] [place] instead.   Ok to re-send?

## 2020-07-02 ENCOUNTER — Ambulatory Visit: Payer: No Typology Code available for payment source

## 2020-07-09 ENCOUNTER — Telehealth: Payer: Self-pay | Admitting: Family Medicine

## 2020-07-09 NOTE — Telephone Encounter (Signed)
Pt call would like to stay a patient along with her kids at [facility] advise Freada that have to reach to Dr.Rolla will call back conform if she can stay patient also the kids. Please advise.

## 2020-07-10 NOTE — Telephone Encounter (Signed)
Okay to change family to [place]

## 2020-07-12 MED FILL — METHOCARBAMOL 750 MG TABS: 750 | 30 days supply | Qty: 90 | Fill #1

## 2020-07-12 NOTE — Telephone Encounter (Signed)
Karen Schwartz

## 2020-07-19 ENCOUNTER — Other Ambulatory Visit (HOSPITAL_COMMUNITY): Payer: Self-pay | Admitting: Dentistry

## 2020-07-19 MED FILL — SODIUM FLUORIDE 5000 PPM 1.: 1.1 | 30 days supply | Qty: 100 | Fill #0

## 2020-07-20 MED FILL — traMADol HCL 50 MG TABS: 50 | 15 days supply | Qty: 120 | Fill #1

## 2020-07-21 ENCOUNTER — Encounter: Payer: Self-pay | Admitting: *Deleted

## 2020-08-12 ENCOUNTER — Other Ambulatory Visit (HOSPITAL_BASED_OUTPATIENT_CLINIC_OR_DEPARTMENT_OTHER): Payer: Self-pay

## 2020-08-19 ENCOUNTER — Other Ambulatory Visit: Payer: Self-pay | Admitting: Nurse Practitioner

## 2020-08-19 ENCOUNTER — Other Ambulatory Visit: Payer: Self-pay | Admitting: Family Medicine

## 2020-08-19 MED FILL — traMADol HCL 50 MG TABS: 50 | 15 days supply | Qty: 120 | Fill #0

## 2020-08-19 MED FILL — AMITRIPTYLINE HCL 50 MG TAB: 50 | 90 days supply | Qty: 90 | Fill #1

## 2020-08-19 NOTE — Telephone Encounter (Signed)
Please let patient know that I will refill for 1 month and she will need an appointment with me in the next month.  She is more than welcome to see me, however please let her know that I am not comfortable prescribing Tramadol for daily use.  At her appointment, we can discuss referral to pain management vs. Other therapies to help with her pain if she would like to stay with me.  If these are not things she wishes to do, we can refill for 1 month while she is looking for a new pcp.

## 2020-08-24 ENCOUNTER — Encounter: Payer: Self-pay | Admitting: Nurse Practitioner

## 2020-08-24 ENCOUNTER — Other Ambulatory Visit: Payer: Self-pay

## 2020-08-24 ENCOUNTER — Ambulatory Visit (INDEPENDENT_AMBULATORY_CARE_PROVIDER_SITE_OTHER): Payer: No Typology Code available for payment source | Admitting: Nurse Practitioner

## 2020-08-24 VITALS — BP 120/78 | HR 89 | Temp 98.1°F | Ht 62.0 in | Wt 134.4 lb

## 2020-08-24 DIAGNOSIS — M797 Fibromyalgia: Secondary | ICD-10-CM

## 2020-08-24 NOTE — Progress Notes (Signed)
Subjective:    Patient ID: Karen Schwartz, female    DOB: 1971-02-13, 50 y.o.   MRN: [id number]  HPI: Karen Schwartz is a 50 y.o. female presenting for  Chief Complaint  Patient presents with  . Follow-up    Taking tramadol for chronic back pain   CHRONIC PAIN  Patient reports her pain started in high school, she was taking ibuprofen daily.  Pain changed around 50 years old - muscles would get hard and would hurt. More of like a burning/stabbing with hot poker.  Now has pain in the middle of her back near the area of her bra line.  She is originally from [place].  When she and her husband moved [place], [place] etiologies for her pain other than fibromyalgia.  She reports she had an MRI that was completely normal and was started on Pamelor at that time.  She was not able to tolerate Pamelor subsequently started on Robaxin/Naproxen regimen which made her pain more manageable.    When she became a patient at our clinic,  Tramadol and nortriptyline were started for fibromyalgia.  She was very resistant to taking narcotic medication, especially daily, for fear of becoming physically dependent, but found that Tramadol gives her optimal function with minimal side effects.    She has tried Physical Therapy in the past; this made her pain worse.  Has had reactions to pregabalin and SNRIs in the past.  Currently, she is taking Tramadol 50 mg 2 tablets twice daily for pain.  This does well for her pain and has for a number of years. She is also taking high-dose guaifenesin to help with pain.  She reports she does not want to be on Tramadol but knows she must continue on it to maintain her quality of life.  She does not want the Tramadol to be increased and is adamant against taking any other narcotic medication.  Present dose: 20 Morphine equivalents Pain control status: better Duration: chronic Location: back Quality: hot  poker Current Pain Level: manageable Previous Pain Level: severe Treatments attempted: Ice/heat works sometimes. Gets massage monthly.  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 Ampicillin   . Latex Itching  . Lyrica [Pregabalin]   . Zantac [Ranitidine Hcl]     Tachycardia    Outpatient Encounter Medications as of 08/24/2020  Medication Sig  . amitriptyline (ELAVIL) 50 MG tablet TAKE 1 TABLET BY MOUTH AT BEDTIME.  Marland Kitchen guaiFENesin (MUCINEX) 600 MG 12 hr tablet Take 600 mg by mouth 2 (two) times daily. PRN  . hydrOXYzine (ATARAX/VISTARIL) 25 MG tablet Take 1 tablet (25 mg total) by mouth 3 (three) times daily as needed.  . methocarbamol (ROBAXIN) 750 MG tablet TAKE 1 TABLET BY MOUTH 3 TIMES DAILY  . metoprolol succinate (TOPROL-XL) 25 MG 24 hr tablet TAKE 1/2 TABLET BY MOUTH EVERY MORNING AND 1 TABLET BY MOUTH EVERY EVENING  . naproxen (NAPROSYN) 500 MG tablet Take 500 mg by mouth 2 (two) times daily with a meal.  .  polyethylene glycol (MIRALAX / GLYCOLAX) packet Take 17 g by mouth daily.  . Sodium Fluoride 1.1 % PSTE USE AS TOOTHPASTE FOR BRUSHING  . SUMAtriptan (IMITREX) 100 MG tablet TAKE 1 TABLET BY MOUTH EVERY 2 HOURS AS NEEDED AS DIRECTED  . traMADol (ULTRAM) 50 MG tablet TAKE 1 TO 2 TABLETS BY MOUTH EVERY 6 HOURS AS NEEDED  . [DISCONTINUED] triamcinolone cream (KENALOG) 0.1 % Apply 1 application topically 2 (two) times daily.   No facility-administered encounter medications on file as of 08/24/2020.    Patient Active Problem List   Diagnosis  Date Noted  . Chronic constipation 02/11/2018  . Bloating 02/11/2018  . Raynauds phenomenon 10/02/2017  . Migraines 10/02/2017  . IBS (irritable bowel syndrome) 08/03/2016  . Fibromyalgia 08/02/2016  . Tachycardia 08/02/2016  . Eczema, allergic 05/12/2015  . Environmental allergies 05/12/2015  . Hypertension     Past Medical History:  Diagnosis Date  . Chronic headaches   . Constipation   . Eczema   . Fibromyalgia   . Hypertension    boderline  . IBS (irritable bowel syndrome)     Relevant past medical, surgical, family and social history reviewed and updated as indicated. Interim medical history since our last visit reviewed.  Review of Systems Per HPI unless specifically indicated above     Objective:    BP 120/78   Pulse 89   Temp 98.1 F (36.7 C)   Ht 5\' 2"  (1.575 m)   Wt 134 lb 6.4 oz (61 kg)   LMP 08/19/2020   SpO2 98%   BMI 24.58 kg/m   Wt Readings from Last 3 Encounters:  08/24/20 134 lb 6.4 oz (61 kg)  05/03/20 139 lb 6.4 oz (63.2 kg)  04/27/20 138 lb (62.6 kg)    Physical Exam Vitals reviewed.  Constitutional:      General: She is not in acute distress.    Appearance: Normal appearance. She is not toxic-appearing.  Musculoskeletal:        General: Normal range of motion.  Skin:    Coloration: Skin is not jaundiced or pale.     Findings: No erythema.  Neurological:     Mental Status: She is alert and oriented to person, place, and time.  Psychiatric:        Mood and Affect: Mood normal.        Behavior: Behavior normal.        Thought Content: Thought content normal.        Judgment: Judgment normal.       Assessment & Plan:  1. Fibromyalgia Chronic.  Stable on current regimen of Tramadol 50 mg 2 tablets twice daily prn.  Long discussion had today surrounding the risks and side effects associated with long-term use of narcotic medication .  I discussed with her options for moving forward including establishing with pain management clinic  for management of her chronic pain secondary to fibromyalgia.  At this time, I am not comfortable prescribing narcotic medication for daily use.  The other option would be slowly weaning down on Tramadol and trying alternative medications like pregabalin and duloxetine which have shown to have benefit in people with fibromyalgia.  However, she has not responded well to these medications in the past and we have decided not to pursue this path at this time.  She is agreeable for a referral to pain management.  For now, I will fill her prescription for Tramadol while we are awaiting appointment with Pain Management.  Pt  is aware of risks of daily use of narcotic medication to include increased sedation, respiratory suppression, falls, and dependence.  Pt would like to continue treatment as benefit determined to outweigh risk.    - Ambulatory referral to Pain Clinic    Follow up plan: Return if symptoms worsen or fail to improve.

## 2020-09-15 ENCOUNTER — Telehealth: Payer: Self-pay | Admitting: Nurse Practitioner

## 2020-09-15 ENCOUNTER — Other Ambulatory Visit (HOSPITAL_COMMUNITY): Payer: Self-pay

## 2020-09-15 ENCOUNTER — Other Ambulatory Visit: Payer: Self-pay | Admitting: Family Medicine

## 2020-09-15 NOTE — Telephone Encounter (Signed)
Patient called to follow up on referral for pain management; patient playing phone tag with office she was referred to. To date, patient hasn't been able to schedule an appointment.   Patient also inquiring about refill for pain med SUMAtriptan (IMITREX) 100 MG tablet [[id number]] (recent appt with provider on 08/24/2020) Patient only has a couple of pills left.   Pharmacy:   [place], [place] - [address]  [place], [place] [address]  Phone:  [phone] Fax:  [phone]   Please advise at [phone]

## 2020-09-16 ENCOUNTER — Encounter: Payer: Self-pay | Admitting: Nurse Practitioner

## 2020-09-16 ENCOUNTER — Other Ambulatory Visit (HOSPITAL_COMMUNITY): Payer: Self-pay

## 2020-09-16 MED ORDER — METOPROLOL SUCCINATE ER 25 MG PO TB24
ORAL_TABLET | ORAL | 3 refills | Status: DC
  Filled 2020-09-16: qty 135, 90d supply, fill #0
  Filled 2021-01-03: qty 135, 90d supply, fill #1
  Filled 2021-04-05: qty 135, 90d supply, fill #2
  Filled 2021-07-14: qty 135, 90d supply, fill #3

## 2020-09-17 ENCOUNTER — Other Ambulatory Visit: Payer: Self-pay | Admitting: Nurse Practitioner

## 2020-09-17 NOTE — Telephone Encounter (Signed)
I am willing to refill her Tramadol until she sees the specialist.

## 2020-09-20 ENCOUNTER — Other Ambulatory Visit (HOSPITAL_COMMUNITY): Payer: Self-pay

## 2020-09-20 MED ORDER — TRAMADOL HCL 50 MG PO TABS
100.0000 mg | ORAL_TABLET | Freq: Two times a day (BID) | ORAL | 0 refills | Status: DC | PRN
  Filled 2020-09-20: qty 120, 30d supply, fill #0

## 2020-09-20 NOTE — Telephone Encounter (Signed)
PDMP reviewed and appropriate.  Patient has been referred to pain management, will continue to refill while patient works on establishing care.

## 2020-10-20 ENCOUNTER — Encounter: Payer: Self-pay | Admitting: Nurse Practitioner

## 2020-10-20 ENCOUNTER — Other Ambulatory Visit: Payer: Self-pay

## 2020-10-20 ENCOUNTER — Other Ambulatory Visit (HOSPITAL_COMMUNITY): Payer: Self-pay

## 2020-10-20 MED FILL — Methocarbamol Tab 750 MG: ORAL | 30 days supply | Qty: 90 | Fill #0 | Status: AC

## 2020-10-25 ENCOUNTER — Other Ambulatory Visit (HOSPITAL_COMMUNITY): Payer: Self-pay

## 2020-10-27 ENCOUNTER — Other Ambulatory Visit: Payer: Self-pay

## 2020-10-27 ENCOUNTER — Other Ambulatory Visit (HOSPITAL_COMMUNITY): Payer: Self-pay

## 2020-10-27 ENCOUNTER — Other Ambulatory Visit: Payer: Self-pay | Admitting: Nurse Practitioner

## 2020-10-27 NOTE — Telephone Encounter (Signed)
Spoke wi th pt, she understands that med request has been sent to pcp for fill. Pt says she has been receiving calls asking does she really need the medication. Informed pt that those calls did not come from [facility]

## 2020-10-28 ENCOUNTER — Other Ambulatory Visit (HOSPITAL_COMMUNITY): Payer: Self-pay

## 2020-10-28 MED ORDER — TRAMADOL HCL 50 MG PO TABS
100.0000 mg | ORAL_TABLET | Freq: Two times a day (BID) | ORAL | 0 refills | Status: DC | PRN
  Filled 2020-10-28: qty 120, 30d supply, fill #0

## 2020-10-28 NOTE — Telephone Encounter (Signed)
Duplicate request

## 2020-10-28 NOTE — Telephone Encounter (Signed)
Ok to refill??  Last office visit 08/24/2020.  Last refill 09/20/2020.

## 2020-10-28 NOTE — Telephone Encounter (Signed)
PDMP reviewed and appropriate.  Patient is taking this medication chronically- 100 mg twice daily.  She has been referred to pain management and we are awaiting appointment with them.  We will bridge medication until she is seen by pain management provider.

## 2020-10-29 ENCOUNTER — Other Ambulatory Visit (HOSPITAL_COMMUNITY): Payer: Self-pay

## 2020-11-15 ENCOUNTER — Encounter: Payer: Self-pay | Admitting: Nurse Practitioner

## 2020-11-17 ENCOUNTER — Other Ambulatory Visit (HOSPITAL_COMMUNITY): Payer: Self-pay

## 2020-11-17 MED ORDER — AZITHROMYCIN 250 MG PO TABS
500.0000 mg | ORAL_TABLET | Freq: Every day | ORAL | 0 refills | Status: AC
  Filled 2020-11-17: qty 6, 3d supply, fill #0

## 2020-12-05 NOTE — Progress Notes (Signed)
Patient: Karen Schwartz  Service Category: E/M  Provider: Gaspar Cola, MD  DOB: 1971/01/04  DOS: 12/06/2020  Referring Provider: Eulogio Bear, NP  MRN: [id number]  Setting: Ambulatory outpatient  PCP: Eulogio Bear, NP  Type: New Patient  Specialty: Interventional Pain Management    Location: Office  Delivery: Face-to-face     Primary Reason(s) for Visit: Encounter for initial evaluation of one or more chronic problems (new to examiner) potentially causing chronic pain, and posing a threat to normal musculoskeletal function. (Level of risk: High) CC: Back Pain (Upper at bra line)  HPI  Karen Schwartz is a 50 y.o. year old, female patient, who comes for the first time to our practice referred by Eulogio Bear, NP for our initial evaluation of her chronic pain. She has Hypertension; Eczema, allergic; Environmental allergies; Fibromyalgia; Tachycardia; IBS (irritable bowel syndrome); Raynauds phenomenon; Migraines; Chronic constipation; Bloating; Atopic dermatitis; Contact dermatitis and other eczema due to other chemical products; Myalgia and myositis; Other chronic allergic conjunctivitis; Chronic pain syndrome; Pharmacologic therapy; Disorder of skeletal system; Problems influencing health status; Chronic thoracic back pain (1ry area of Pain) (Bilateral) (L>R); and Myofascial pain syndrome of thoracic spine on their problem list. Today she comes in for evaluation of her Back Pain (Upper at bra line)  Pain Assessment: Location: Upper Back Radiating: radiating up to neck Onset: More than a month ago Duration: Chronic pain Quality: Stabbing (feels like a hot sensation throughtout the back) Severity: 6 /10 (subjective, self-reported pain score)  Effect on ADL: limits activities Timing: Constant Modifying factors: medications, tramadol, heat, massage BP: 120/77  HR: 92  Onset and Duration: Gradual Cause of pain: Unknown Severity: Getting worse and NAS-11 at its worse:  7/10 Timing: Afternoon and After activity or exercise Aggravating Factors: Climbing, Lifiting, Prolonged sitting, Prolonged standing, Walking uphill, and Working Alleviating Factors: Stretching, Cold packs, Hot packs, Lying down, Medications, TENS, Warm showers or baths, and massage Associated Problems: Constipation, Fatigue, Inability to concentrate, and Pain that does not allow patient to sleep Quality of Pain: Aching, Burning, Intermittent, Distressing, Dreadful, Exhausting, Feeling of constriction, Horrible, Nagging, Sharp, Stabbing, and Tiring Previous Examinations or Tests: MRI scan, Neurological evaluation, and Orthopedic evaluation Previous Treatments: Chiropractic manipulations, Narcotic medications, Physical Therapy, Pool exercises, Relaxation therapy, Stretching exercises, and TENS  According to the patient the primary area of pain is that of the mid upper back (Bilateral) (L>R).  The patient denies any prior surgeries, nerve blocks, recent x-rays, but she does admit to having tried physical therapy which made the pain worse.  She indicated having had a full work-up around 2003-4 while she was at [facility].  Today we will have the patient sign some release form so that we can request some of that information.  The patient's secondary area pain is that of monthly migraines which usually respond well to Imitrex.  In addition, the patient has a diagnosis of fibromyalgia which she indicates was gradually changed into that diagnosis from under her general diagnosis of myofascial pain syndrome.  She indicates that this was done by her primary care physician because she was having chronic fatigue.  However, in reviewing the patient's areas of pain, she does not fit the diagnostic criteria for fibromyalgia.  However, she does fit the diagnostic criteria for myofascial pain syndrome affecting the thoracic paravertebral muscles.  Today I took the time to provide the  patient with information regarding my pain practice. The patient was informed that my  practice is divided into two sections: an interventional pain management section, as well as a completely separate and distinct medication management section. I explained that I have procedure days for my interventional therapies, and evaluation days for follow-ups and medication management. Because of the amount of documentation required during both, they are kept separated. This means that there is the possibility that she may be scheduled for a procedure on one day, and medication management the next. I have also informed her that because of staffing and facility limitations, I no longer take patients for medication management only. To illustrate the reasons for this, I gave the patient the example of surgeons, and how inappropriate it would be to refer a patient to his/her care, just to write for the post-surgical antibiotics on a surgery done by a different surgeon.   Because interventional pain management is my board-certified specialty, the patient was informed that joining my practice means that they are open to any and all interventional therapies. I made it clear that this does not mean that they will be forced to have any procedures done. What this means is that I believe interventional therapies to be essential part of the diagnosis and proper management of chronic pain conditions. Therefore, patients not interested in these interventional alternatives will be better served under the care of a different practitioner.  The patient was also made aware of my Comprehensive Pain Management Safety Guidelines where by joining my practice, they limit all of their nerve blocks and joint injections to those done by our practice, for as long as we are retained to manage their care.   Historic Controlled Substance Pharmacotherapy Review  PMP and historical list of controlled substances: Tramadol 50 mg, 2 tabs p.o. twice  daily (200 mg/day of tramadol) (20 MME); alprazolam 0.5 mg 1 daily. Current opioid analgesics:  Tramadol 50 mg, 2 tabs p.o. twice daily (200 mg/day of tramadol) (20 MME)  MME/day: 20 mg/day  Historical Monitoring: The patient  reports no history of drug use. List of all UDS Test(s): No results found for: MDMA, COCAINSCRNUR, [place], [place], CANNABQUANT, THCU, [place] List of other Serum/Urine Drug Screening Test(s):  No results found for: AMPHSCRSER, BARBSCRSER, BENZOSCRSER, COCAINSCRSER, COCAINSCRNUR, PCPSCRSER, PCPQUANT, THCSCRSER, THCU, CANNABQUANT, OPIATESCRSER, OXYSCRSER, PROPOXSCRSER, ETH Historical Background Evaluation: [place] PMP: PDMP reviewed during this encounter. Online review of the past 39-month period conducted.             PMP NARX Score Report:  Narcotic: 350 Sedative: 150 Stimulant: 000 [place] Department of public safety, offender search: Editor, commissioning Information) Non-contributory Risk Assessment Profile: Aberrant behavior: None observed or detected today Risk factors for fatal opioid overdose: None identified today PMP NARX Overdose Risk Score: 300 Fatal overdose hazard ratio (HR): Calculation deferred Non-fatal overdose hazard ratio (HR): Calculation deferred Risk of opioid abuse or dependence: 0.7-3.0% with doses ? 36 MME/day and 6.1-26% with doses ? 120 MME/day. Substance use disorder (SUD) risk level: See below Personal History of Substance Abuse (SUD-Substance use disorder):  Alcohol: Positive Female or Female  Illegal Drugs: Negative  Rx Drugs: Negative  ORT Risk Level calculation: Moderate Risk  Opioid Risk Tool - 12/06/20 1408       Family History of Substance Abuse   Alcohol Positive Female    Illegal Drugs Negative    Rx Drugs Negative      Personal History of Substance Abuse   Alcohol Positive Female or Female    Illegal Drugs Negative    Rx Drugs Negative  Age   Age between 89-45 years  No      History of Preadolescent Sexual Abuse   History of  Preadolescent Sexual Abuse Negative or Female      Psychological Disease   Psychological Disease Negative    Depression Negative      Total Score   Opioid Risk Tool Scoring 4    Opioid Risk Interpretation Moderate Risk            ORT Scoring interpretation table:  Score <3 = Low Risk for SUD  Score between 4-7 = Moderate Risk for SUD  Score >8 = High Risk for Opioid Abuse   PHQ-2 Depression Scale:  Total score: 0  PHQ-2 Scoring interpretation table: (Score and probability of major depressive disorder)  Score 0 = No depression  Score 1 = 15.4% Probability  Score 2 = 21.1% Probability  Score 3 = 38.4% Probability  Score 4 = 45.5% Probability  Score 5 = 56.4% Probability  Score 6 = 78.6% Probability   PHQ-9 Depression Scale:  Total score: 0  PHQ-9 Scoring interpretation table:  Score 0-4 = No depression  Score 5-9 = Mild depression  Score 10-14 = Moderate depression  Score 15-19 = Moderately Schwartz depression  Score 20-27 = Schwartz depression (2.4 times higher risk of SUD and 2.89 times higher risk of overuse)   Pharmacologic Plan: As per protocol, I have not taken over any controlled substance management, pending the results of ordered tests and/or consults.            Initial impression: Pending review of available data and ordered tests.  Meds   Current Outpatient Medications:    amitriptyline (ELAVIL) 50 MG tablet, TAKE 1 TABLET BY MOUTH AT BEDTIME., Disp: 90 tablet, Rfl: 1   guaiFENesin (MUCINEX) 600 MG 12 hr tablet, Take 600 mg by mouth 2 (two) times daily. PRN, Disp: , Rfl:    hydrOXYzine (ATARAX/VISTARIL) 25 MG tablet, Take 1 tablet (25 mg total) by mouth 3 (three) times daily as needed., Disp: 45 tablet, Rfl: 2   methocarbamol (ROBAXIN) 750 MG tablet, TAKE 1 TABLET BY MOUTH 3 TIMES DAILY, Disp: 90 tablet, Rfl: 2   metoprolol succinate (TOPROL-XL) 25 MG 24 hr tablet, TAKE 1/2 TABLET BY MOUTH EVERY MORNING AND 1 TABLET BY MOUTH EVERY EVENING (Patient taking  differently: Take 1/2 every evening and 1 tablet every morning.), Disp: 135 tablet, Rfl: 3   naproxen (NAPROSYN) 500 MG tablet, Take 500 mg by mouth 2 (two) times daily with a meal., Disp: , Rfl:    polyethylene glycol (MIRALAX / GLYCOLAX) packet, Take 17 g by mouth daily., Disp: , Rfl:    Sodium Fluoride 1.1 % PSTE, USE AS TOOTHPASTE FOR BRUSHING, Disp: 100 mL, Rfl: 4   SUMAtriptan (IMITREX) 100 MG tablet, TAKE 1 TABLET BY MOUTH EVERY 2 HOURS AS NEEDED AS DIRECTED, Disp: 9 tablet, Rfl: 3   traMADol (ULTRAM) 50 MG tablet, Take 2 tablets (100 mg total) by mouth every 12 (twelve) hours as needed for moderate pain., Disp: 120 tablet, Rfl: 0  Imaging Review  Cervical Imaging: Cervical MR wo contrast: No results found for this or any previous visit.  Cervical MR wo contrast: No valid procedures specified. Cervical MR w/wo contrast: No results found for this or any previous visit.  Cervical MR w contrast: No results found for this or any previous visit.  Cervical CT wo contrast: No results found for this or any previous visit.  Cervical CT w/wo contrast: No  results found for this or any previous visit.  Cervical CT w/wo contrast: No results found for this or any previous visit.  Cervical CT w contrast: No results found for this or any previous visit.  Cervical CT outside: No results found for this or any previous visit.  Cervical DG 1 view: No results found for this or any previous visit.  Cervical DG 2-3 views: No results found for this or any previous visit.  Cervical DG F/E views: No results found for this or any previous visit.  Cervical DG 2-3 clearing views: No results found for this or any previous visit.  Cervical DG Bending/F/E views: No results found for this or any previous visit.  Cervical DG complete: No results found for this or any previous visit.  Cervical DG Myelogram views: No results found for this or any previous visit.  Cervical DG Myelogram views: No results  found for this or any previous visit.  Cervical Discogram views: No results found for this or any previous visit.   Shoulder Imaging: Shoulder-R MR w contrast: No results found for this or any previous visit.  Shoulder-L MR w contrast: No results found for this or any previous visit.  Shoulder-R MR w/wo contrast: No results found for this or any previous visit.  Shoulder-L MR w/wo contrast: No results found for this or any previous visit.  Shoulder-R MR wo contrast: No results found for this or any previous visit.  Shoulder-L MR wo contrast: No results found for this or any previous visit.  Shoulder-R CT w contrast: No results found for this or any previous visit.  Shoulder-L CT w contrast: No results found for this or any previous visit.  Shoulder-R CT w/wo contrast: No results found for this or any previous visit.  Shoulder-L CT w/wo contrast: No results found for this or any previous visit.  Shoulder-R CT wo contrast: No results found for this or any previous visit.  Shoulder-L CT wo contrast: No results found for this or any previous visit.  Shoulder-R DG Arthrogram: No results found for this or any previous visit.  Shoulder-L DG Arthrogram: No results found for this or any previous visit.  Shoulder-R DG 1 view: No results found for this or any previous visit.  Shoulder-L DG 1 view: No results found for this or any previous visit.  Shoulder-R DG: No results found for this or any previous visit.  Shoulder-L DG: No results found for this or any previous visit.   Thoracic Imaging: Thoracic MR wo contrast: No results found for this or any previous visit.  Thoracic MR wo contrast: No valid procedures specified. Thoracic MR w/wo contrast: No results found for this or any previous visit.  Thoracic MR w contrast: No results found for this or any previous visit.  Thoracic CT wo contrast: No results found for this or any previous visit.  Thoracic CT w/wo contrast: No  results found for this or any previous visit.  Thoracic CT w/wo contrast: No results found for this or any previous visit.  Thoracic CT w contrast: No results found for this or any previous visit.  Thoracic DG 2-3 views: No results found for this or any previous visit.  Thoracic DG 4 views: No results found for this or any previous visit.  Thoracic DG: No results found for this or any previous visit.  Thoracic DG w/swimmers view: No results found for this or any previous visit.  Thoracic DG Myelogram views: No results found for this or any  previous visit.  Thoracic DG Myelogram views: No results found for this or any previous visit.   Lumbosacral Imaging: Lumbar MR wo contrast: No results found for this or any previous visit.  Lumbar MR wo contrast: No valid procedures specified. Lumbar MR w/wo contrast: No results found for this or any previous visit.  Lumbar MR w/wo contrast: No results found for this or any previous visit.  Lumbar MR w contrast: No results found for this or any previous visit.  Lumbar CT wo contrast: No results found for this or any previous visit.  Lumbar CT w/wo contrast: No results found for this or any previous visit.  Lumbar CT w/wo contrast: No results found for this or any previous visit.  Lumbar CT w contrast: No results found for this or any previous visit.  Lumbar DG 1V: No results found for this or any previous visit.  Lumbar DG 1V (Clearing): No results found for this or any previous visit.  Lumbar DG 2-3V (Clearing): No results found for this or any previous visit.  Lumbar DG 2-3 views: No results found for this or any previous visit.  Lumbar DG (Complete) 4+V: No results found for this or any previous visit.        Lumbar DG F/E views: No results found for this or any previous visit.        Lumbar DG Bending views: No results found for this or any previous visit.        Lumbar DG Myelogram views: No results found for this or any  previous visit.  Lumbar DG Myelogram: No results found for this or any previous visit.  Lumbar DG Myelogram: No results found for this or any previous visit.  Lumbar DG Myelogram: No results found for this or any previous visit.  Lumbar DG Myelogram Lumbosacral: No results found for this or any previous visit.  Lumbar DG Diskogram views: No results found for this or any previous visit.  Lumbar DG Diskogram views: No results found for this or any previous visit.  Lumbar DG Epidurogram OP: No results found for this or any previous visit.  Lumbar DG Epidurogram IP: No results found for this or any previous visit.   Sacroiliac Joint Imaging: Sacroiliac Joint DG: No results found for this or any previous visit.  Sacroiliac Joint MR w/wo contrast: No results found for this or any previous visit.  Sacroiliac Joint MR wo contrast: No results found for this or any previous visit.   Spine Imaging: Whole Spine DG Myelogram views: No results found for this or any previous visit.  Whole Spine MR Mets screen: No results found for this or any previous visit.  Whole Spine MR Mets screen: No results found for this or any previous visit.  Whole Spine MR w/wo: No results found for this or any previous visit.  MRA Spinal Canal w/ cm: No results found for this or any previous visit.  MRA Spinal Canal wo/ cm: No valid procedures specified. MRA Spinal Canal w/wo cm: No results found for this or any previous visit.  Spine Outside MR Films: No results found for this or any previous visit.  Spine Outside CT Films: No results found for this or any previous visit.  CT-Guided Biopsy: No results found for this or any previous visit.  CT-Guided Needle Placement: No results found for this or any previous visit.  DG Spine outside: No results found for this or any previous visit.  IR Spine outside: No results found for  this or any previous visit.  NM Spine outside: No results found for this or any  previous visit.   Hip Imaging: Hip-R MR w contrast: No results found for this or any previous visit.  Hip-L MR w contrast: No results found for this or any previous visit.  Hip-R MR w/wo contrast: No results found for this or any previous visit.  Hip-L MR w/wo contrast: No results found for this or any previous visit.  Hip-R MR wo contrast: No results found for this or any previous visit.  Hip-L MR wo contrast: No results found for this or any previous visit.  Hip-R CT w contrast: No results found for this or any previous visit.  Hip-L CT w contrast: No results found for this or any previous visit.  Hip-R CT w/wo contrast: No results found for this or any previous visit.  Hip-L CT w/wo contrast: No results found for this or any previous visit.  Hip-R CT wo contrast: No results found for this or any previous visit.  Hip-L CT wo contrast: No results found for this or any previous visit.  Hip-R DG 2-3 views: No results found for this or any previous visit.  Hip-L DG 2-3 views: No results found for this or any previous visit.  Hip-R DG Arthrogram: No results found for this or any previous visit.  Hip-L DG Arthrogram: No results found for this or any previous visit.  Hip-B DG Bilateral: No results found for this or any previous visit.   Knee Imaging: Knee-R MR w contrast: No results found for this or any previous visit.  Knee-L MR w/o contrast: No results found for this or any previous visit.  Knee-R MR w/wo contrast: No results found for this or any previous visit.  Knee-L MR w/wo contrast: No results found for this or any previous visit.  Knee-R MR wo contrast: No results found for this or any previous visit.  Knee-L MR wo contrast: No results found for this or any previous visit.  Knee-R CT w contrast: No results found for this or any previous visit.  Knee-L CT w contrast: No results found for this or any previous visit.  Knee-R CT w/wo contrast: No results found  for this or any previous visit.  Knee-L CT w/wo contrast: No results found for this or any previous visit.  Knee-R CT wo contrast: No results found for this or any previous visit.  Knee-L CT wo contrast: No results found for this or any previous visit.  Knee-R DG 1-2 views: No results found for this or any previous visit.  Knee-L DG 1-2 views: No results found for this or any previous visit.  Knee-R DG 3 views: No results found for this or any previous visit.  Knee-L DG 3 views: No results found for this or any previous visit.  Knee-R DG 4 views: No results found for this or any previous visit.  Knee-L DG 4 views: No results found for this or any previous visit.  Knee-R DG Arthrogram: No results found for this or any previous visit.  Knee-L DG Arthrogram: No results found for this or any previous visit.   Ankle Imaging: Ankle-R DG Complete: No results found for this or any previous visit.  Ankle-L DG Complete: No results found for this or any previous visit.   Foot Imaging: Foot-R DG Complete: No results found for this or any previous visit.  Foot-L DG Complete: No results found for this or any previous visit.   Elbow  Imaging: Elbow-R DG Complete: No results found for this or any previous visit.  Elbow-L DG Complete: No results found for this or any previous visit.   Wrist Imaging: Wrist-R DG Complete: No results found for this or any previous visit.  Wrist-L DG Complete: No results found for this or any previous visit.   Hand Imaging: Hand-R DG Complete: No results found for this or any previous visit.  Hand-L DG Complete: No results found for this or any previous visit.   Complexity Note: Imaging results reviewed. Results shared with Karen Schwartz, using Layman's terms.                         ROS  Cardiovascular: High blood pressure Pulmonary or Respiratory: Exposure to tuberculosis Neurological: No reported neurological signs or symptoms such as  seizures, abnormal skin sensations, urinary and/or fecal incontinence, being born with an abnormal open spine and/or a tethered spinal cord Psychological-Psychiatric: No reported psychological or psychiatric signs or symptoms such as difficulty sleeping, anxiety, depression, delusions or hallucinations (schizophrenial), mood swings (bipolar disorders) or suicidal ideations or attempts Gastrointestinal: Irregular, infrequent bowel movements (Constipation) Genitourinary: No reported renal or genitourinary signs or symptoms such as difficulty voiding or producing urine, peeing blood, non-functioning kidney, kidney stones, difficulty emptying the bladder, difficulty controlling the flow of urine, or chronic kidney disease Hematological: No reported hematological signs or symptoms such as prolonged bleeding, low or poor functioning platelets, bruising or bleeding easily, hereditary bleeding problems, low energy levels due to low hemoglobin or being anemic Endocrine: No reported endocrine signs or symptoms such as high or low blood sugar, rapid heart rate due to high thyroid levels, obesity or weight gain due to slow thyroid or thyroid disease Rheumatologic: Generalized muscle aches (Fibromyalgia) and Constant unexplained fatigue (Chronic Fatigue Syndrome) Musculoskeletal: Negative for myasthenia gravis, muscular dystrophy, multiple sclerosis or malignant hyperthermia Work History: Working full time  Allergies  Karen Schwartz is allergic to ampicillin, latex, lyrica [pregabalin], and zantac [ranitidine hcl].  Laboratory Chemistry Profile   Renal Lab Results  Component Value Date   BUN 20 04/27/2020   CREATININE 0.79 01/74/9449   BCR NOT APPLICABLE 67/59/1638   GFRAA 92 03/22/2018   GFRNONAA 79 03/22/2018     Electrolytes Lab Results  Component Value Date   NA 139 04/27/2020   K 4.3 04/27/2020   CL 102 04/27/2020   CALCIUM 8.9 04/27/2020     Hepatic Lab Results  Component Value Date   AST  16 04/27/2020   ALT 10 04/27/2020   ALBUMIN 3.8 03/06/2016   ALKPHOS 44 03/06/2016     ID No results found for: LYMEIGGIGMAB, HIV, SARSCOV2NAA, STAPHAUREUS, MRSAPCR, HCVAB, PREGTESTUR, RMSFIGG, QFVRPH1IGG, QFVRPH2IGG, LYMEIGGIGMAB   Bone Lab Results  Component Value Date   VD25OH 77 04/27/2020     Endocrine Lab Results  Component Value Date   GLUCOSE 80 04/27/2020   TSH 5.83 (H) 04/27/2020   FREET4 1.2 04/27/2020     Neuropathy No results found for: VITAMINB12, FOLATE, HGBA1C, HIV   CNS No results found for: COLORCSF, APPEARCSF, RBCCOUNTCSF, WBCCSF, POLYSCSF, LYMPHSCSF, EOSCSF, PROTEINCSF, GLUCCSF, JCVIRUS, CSFOLI, IGGCSF, LABACHR, ACETBL, LABACHR, ACETBL   Inflammation (CRP: Acute  ESR: Chronic) No results found for: CRP, ESRSEDRATE, LATICACIDVEN   Rheumatology No results found for: RF, ANA, LABURIC, URICUR, LYMEIGGIGMAB, LYMEABIGMQN, HLAB27   Coagulation Lab Results  Component Value Date   PLT 267 04/27/2020     Cardiovascular Lab Results  Component Value Date  HGB 14.2 04/27/2020   HCT 42.7 04/27/2020     Screening No results found for: SARSCOV2NAA, COVIDSOURCE, STAPHAUREUS, MRSAPCR, HCVAB, HIV, PREGTESTUR   Cancer No results found for: CEA, CA125, LABCA2   Allergens Lab Results  Component Value Date   APPLE <0.10 09/01/2015       Note: Lab results reviewed.  PFSH  Drug: Karen Schwartz  reports no history of drug use. Alcohol:  reports no history of alcohol use. Tobacco:  reports that she quit smoking about 21 years ago. Her smoking use included cigarettes. She has a 12.00 pack-year smoking history. She has never used smokeless tobacco. Medical:  has a past medical history of Chronic headaches, Constipation, Eczema, Fibromyalgia, Hypertension, and IBS (irritable bowel syndrome). Family: family history includes Alcohol abuse in her brother; Atrial fibrillation in her father; Cancer in her father; Dementia in her maternal grandfather; Depression in her  mother and sister; Diabetes in her maternal grandmother; Hypertension in her mother and sister; Macular degeneration in her maternal grandmother; Mental illness in her brother; Stroke in her mother.  History reviewed. No pertinent surgical history. Active Ambulatory Problems    Diagnosis Date Noted   Hypertension    Eczema, allergic 05/12/2015   Environmental allergies 05/12/2015   Fibromyalgia 08/02/2016   Tachycardia 08/02/2016   IBS (irritable bowel syndrome) 08/03/2016   Raynauds phenomenon 10/02/2017   Migraines 10/02/2017   Chronic constipation 02/11/2018   Bloating 02/11/2018   Atopic dermatitis 12/06/2020   Contact dermatitis and other eczema due to other chemical products 12/06/2020   Myalgia and myositis 12/06/2020   Other chronic allergic conjunctivitis 12/06/2020   Chronic pain syndrome 12/06/2020   Pharmacologic therapy 12/06/2020   Disorder of skeletal system 12/06/2020   Problems influencing health status 12/06/2020   Chronic thoracic back pain (1ry area of Pain) (Bilateral) (L>R) 12/06/2020   Myofascial pain syndrome of thoracic spine 12/06/2020   Resolved Ambulatory Problems    Diagnosis Date Noted   No Resolved Ambulatory Problems   Past Medical History:  Diagnosis Date   Chronic headaches    Constipation    Eczema    Constitutional Exam  General appearance: Well nourished, well developed, and well hydrated. In no apparent acute distress Vitals:   12/06/20 1400  BP: 120/77  Pulse: 92  Resp: 16  Temp: (!) 97.2 F (36.2 C)  SpO2: 98%  Weight: 134 lb (60.8 kg)  Height: $Remove'5\' 2"'yNZzHdc$  (1.575 m)   BMI Assessment: Estimated body mass index is 24.51 kg/m as calculated from the following:   Height as of this encounter: $RemoveBeforeD'5\' 2"'OPcIPyuAiiApLn$  (1.575 m).   Weight as of this encounter: 134 lb (60.8 kg).  BMI interpretation table: BMI level Category Range association with higher incidence of chronic pain  <18 kg/m2 Underweight   18.5-24.9 kg/m2 Ideal body weight   25-29.9 kg/m2  Overweight Increased incidence by 20%  30-34.9 kg/m2 Obese (Class I) Increased incidence by 68%  35-39.9 kg/m2 Schwartz obesity (Class II) Increased incidence by 136%  >40 kg/m2 Extreme obesity (Class III) Increased incidence by 254%   Patient's current BMI Ideal Body weight  Body mass index is 24.51 kg/m. Ideal body weight: 50.1 kg (110 lb 7.2 oz) Adjusted ideal body weight: 54.4 kg (119 lb 13.9 oz)   BMI Readings from Last 4 Encounters:  12/06/20 24.51 kg/m  08/24/20 24.58 kg/m  05/03/20 25.50 kg/m  04/27/20 25.24 kg/m   Wt Readings from Last 4 Encounters:  12/06/20 134 lb (60.8 kg)  08/24/20 134 lb 6.4  oz (61 kg)  05/03/20 139 lb 6.4 oz (63.2 kg)  04/27/20 138 lb (62.6 kg)    Psych/Mental status: Alert, oriented x 3 (person, place, & time)       Eyes: PERLA Respiratory: No evidence of acute respiratory distress  Assessment  Primary Diagnosis & Pertinent Problem List: The primary encounter diagnosis was Chronic pain syndrome. Diagnoses of Chronic thoracic back pain (1ry area of Pain) (Bilateral) (L>R), Myofascial pain syndrome of thoracic spine, Pharmacologic therapy, Disorder of skeletal system, Problems influencing health status, and Encounter for chronic pain management were also pertinent to this visit.  Visit Diagnosis (New problems to examiner): 1. Chronic pain syndrome   2. Chronic thoracic back pain (1ry area of Pain) (Bilateral) (L>R)   3. Myofascial pain syndrome of thoracic spine   4. Pharmacologic therapy   5. Disorder of skeletal system   6. Problems influencing health status   7. Encounter for chronic pain management    Plan of Care (Initial workup plan)  Note: Karen Schwartz was reminded that as per protocol, today's visit has been an evaluation only. We have not taken over the patient's controlled substance management.  Problem-specific plan: No problem-specific Assessment & Plan notes found for this encounter. Lab Orders  Compliance Drug Analysis, Ur   Comp. Metabolic Panel (12)  Magnesium  Vitamin B12  Sedimentation rate  25-Hydroxy vitamin D Lcms D2+D3  C-reactive protein   Imaging Orders  DG Thoracic Spine 4V   Referral Orders  No referral(s) requested today   Procedure Orders    No procedure(s) ordered today   Pharmacotherapy (current): Medications ordered:  No orders of the defined types were placed in this encounter.  Medications administered during this visit: Karen Schwartz had no medications administered during this visit.   Pharmacological management options:  Opioid Analgesics: The patient was informed that there is no guarantee that she would be a candidate for opioid analgesics. The decision will be made following CDC guidelines. This decision will be based on the results of diagnostic studies, as well as Karen Schwartz's risk profile.   Membrane stabilizer: To be determined at a later time  Muscle relaxant: To be determined at a later time  NSAID: To be determined at a later time  Other analgesic(s): To be determined at a later time   Interventional management options: Karen Schwartz was informed that there is no guarantee that she would be a candidate for interventional therapies. The decision will be based on the results of diagnostic studies, as well as Karen Schwartz's risk profile.  Procedure(s) under consideration:  Pending results of ordered studies    Interventional Therapies  Risk  Complexity Considerations:   Estimated body mass index is 24.51 kg/m as calculated from the following:   Height as of this encounter: $RemoveBeforeD'5\' 2"'ztZQgaBDoYMhby$  (1.575 m).   Weight as of this encounter: 134 lb (60.8 kg). WNL   Planned  Pending:   Pending further evaluation   Under consideration:   Possible trigger point injections  Possible diagnostic thoracic facet block (Bilateral)    Completed:   None at this time   Therapeutic  Palliative (PRN) options:   None established   Provider-requested follow-up: Return for evaluation  day (40 min) 2nd Visit "Plan of Care".  No future appointments.   Note by: Gaspar Cola, MD Date: 12/06/2020; Time: 2:56 PM

## 2020-12-06 ENCOUNTER — Encounter: Payer: Self-pay | Admitting: Pain Medicine

## 2020-12-06 ENCOUNTER — Other Ambulatory Visit (HOSPITAL_COMMUNITY): Payer: Self-pay

## 2020-12-06 ENCOUNTER — Ambulatory Visit: Payer: No Typology Code available for payment source | Attending: Pain Medicine | Admitting: Pain Medicine

## 2020-12-06 ENCOUNTER — Ambulatory Visit: Payer: No Typology Code available for payment source | Admitting: Pain Medicine

## 2020-12-06 ENCOUNTER — Other Ambulatory Visit: Payer: Self-pay

## 2020-12-06 ENCOUNTER — Encounter: Payer: Self-pay | Admitting: Nurse Practitioner

## 2020-12-06 VITALS — BP 120/77 | HR 92 | Temp 97.2°F | Resp 16 | Ht 62.0 in | Wt 134.0 lb

## 2020-12-06 DIAGNOSIS — Z79899 Other long term (current) drug therapy: Secondary | ICD-10-CM | POA: Insufficient documentation

## 2020-12-06 DIAGNOSIS — G894 Chronic pain syndrome: Secondary | ICD-10-CM | POA: Diagnosis present

## 2020-12-06 DIAGNOSIS — H1045 Other chronic allergic conjunctivitis: Secondary | ICD-10-CM | POA: Insufficient documentation

## 2020-12-06 DIAGNOSIS — L209 Atopic dermatitis, unspecified: Secondary | ICD-10-CM

## 2020-12-06 DIAGNOSIS — M546 Pain in thoracic spine: Secondary | ICD-10-CM | POA: Diagnosis present

## 2020-12-06 DIAGNOSIS — Z789 Other specified health status: Secondary | ICD-10-CM

## 2020-12-06 DIAGNOSIS — M7918 Myalgia, other site: Secondary | ICD-10-CM | POA: Diagnosis present

## 2020-12-06 DIAGNOSIS — L253 Unspecified contact dermatitis due to other chemical products: Secondary | ICD-10-CM

## 2020-12-06 DIAGNOSIS — G8929 Other chronic pain: Secondary | ICD-10-CM | POA: Insufficient documentation

## 2020-12-06 DIAGNOSIS — IMO0001 Reserved for inherently not codable concepts without codable children: Secondary | ICD-10-CM | POA: Insufficient documentation

## 2020-12-06 DIAGNOSIS — M899 Disorder of bone, unspecified: Secondary | ICD-10-CM

## 2020-12-06 HISTORY — DX: Other chronic allergic conjunctivitis: H10.45

## 2020-12-06 HISTORY — DX: Disorder of bone, unspecified: M89.9

## 2020-12-06 HISTORY — DX: Chronic pain syndrome: G89.4

## 2020-12-06 HISTORY — DX: Other chronic pain: G89.29

## 2020-12-06 HISTORY — DX: Other specified health status: Z78.9

## 2020-12-06 HISTORY — DX: Atopic dermatitis, unspecified: L20.9

## 2020-12-06 HISTORY — DX: Other long term (current) drug therapy: Z79.899

## 2020-12-06 HISTORY — DX: Unspecified contact dermatitis due to other chemical products: L25.3

## 2020-12-06 MED ORDER — TRAMADOL HCL 50 MG PO TABS
100.0000 mg | ORAL_TABLET | Freq: Two times a day (BID) | ORAL | 0 refills | Status: DC | PRN
  Filled 2020-12-06: qty 120, 30d supply, fill #0

## 2020-12-06 NOTE — Telephone Encounter (Signed)
Ok to refill??  Last office visit 08/24/2020.  Last refill 10/28/2020.

## 2020-12-06 NOTE — Patient Instructions (Signed)
____________________________________________________________________________________________  General Risks and Possible Complications  Patient Responsibilities: It is important that you read this as it is part of your informed consent. It is our duty to inform you of the risks and possible complications associated with treatments offered to you. It is your responsibility as a patient to read this and to ask questions about anything that is not clear or that you believe was not covered in this document.  Patient's Rights: You have the right to refuse treatment. You also have the right to change your mind, even after initially having agreed to have the treatment done. However, under this last option, if you wait until the last second to change your mind, you may be charged for the materials used up to that point.  Introduction: Medicine is not an exact science. Everything in Medicine, including the lack of treatment(s), carries the potential for danger, harm, or loss (which is by definition: Risk). In Medicine, a complication is a secondary problem, condition, or disease that can aggravate an already existing one. All treatments carry the risk of possible complications. The fact that a side effects or complications occurs, does not imply that the treatment was conducted incorrectly. It must be clearly understood that these can happen even when everything is done following the highest safety standards.  No treatment: You can choose not to proceed with the proposed treatment alternative. The "PRO(s)" would include: avoiding the risk of complications associated with the therapy. The "CON(s)" would include: not getting any of the treatment benefits. These benefits fall under one of three categories: diagnostic; therapeutic; and/or palliative. Diagnostic benefits include: getting information which can ultimately lead to improvement of the disease or symptom(s). Therapeutic benefits are those associated with the  successful treatment of the disease. Finally, palliative benefits are those related to the decrease of the primary symptoms, without necessarily curing the condition (example: decreasing the pain from a flare-up of a chronic condition, such as incurable terminal cancer).  General Risks and Complications: These are associated to most interventional treatments. They can occur alone, or in combination. They fall under one of the following six (6) categories: no benefit or worsening of symptoms; bleeding; infection; nerve damage; allergic reactions; and/or death. No benefits or worsening of symptoms: In Medicine there are no guarantees, only probabilities. No healthcare provider can ever guarantee that a medical treatment will work, they can only state the probability that it may. Furthermore, there is always the possibility that the condition may worsen, either directly, or indirectly, as a consequence of the treatment. Bleeding: This is more common if the patient is taking a blood thinner, either prescription or over the counter (example: Goody Powders, Fish oil, Aspirin, Garlic, etc.), or if suffering a condition associated with impaired coagulation (example: Hemophilia, cirrhosis of the liver, low platelet counts, etc.). However, even if you do not have one on these, it can still happen. If you have any of these conditions, or take one of these drugs, make sure to notify your treating physician. Infection: This is more common in patients with a compromised immune system, either due to disease (example: diabetes, cancer, human immunodeficiency virus [HIV], etc.), or due to medications or treatments (example: therapies used to treat cancer and rheumatological diseases). However, even if you do not have one on these, it can still happen. If you have any of these conditions, or take one of these drugs, make sure to notify your treating physician. Nerve Damage: This is more common when the treatment is an invasive    one, but it can also happen with the use of medications, such as those used in the treatment of cancer. The damage can occur to small secondary nerves, or to large primary ones, such as those in the spinal cord and brain. This damage may be temporary or permanent and it may lead to impairments that can range from temporary numbness to permanent paralysis and/or brain death. Allergic Reactions: Any time a substance or material comes in contact with our body, there is the possibility of an allergic reaction. These can range from a mild skin rash (contact dermatitis) to a severe systemic reaction (anaphylactic reaction), which can result in death. Death: In general, any medical intervention can result in death, most of the time due to an unforeseen complication. ____________________________________________________________________________________________ ____________________________________________________________________________________________  Medication Rules  Purpose: To inform patients, and their family members, of our rules and regulations.  Applies to: All patients receiving prescriptions (written or electronic).  Pharmacy of record: Pharmacy where electronic prescriptions will be sent. If written prescriptions are taken to a different pharmacy, please inform the nursing staff. The pharmacy listed in the electronic medical record should be the one where you would like electronic prescriptions to be sent.  Electronic prescriptions: In compliance with the [place] (STOP) Act of 2017 (Session Lanny Cramp [phone]), effective May 22, 2018, all controlled substances must be electronically prescribed. Calling prescriptions to the pharmacy will cease to exist.  Prescription refills: Only during scheduled appointments. Applies to all prescriptions.  NOTE: The following applies primarily to controlled substances (Opioid* Pain Medications).   Type of encounter  (visit): For patients receiving controlled substances, face-to-face visits are required. (Not an option or up to the patient.)  Patient's responsibilities: Pain Pills: Bring all pain pills to every appointment (except for procedure appointments). Pill Bottles: Bring pills in original pharmacy bottle. Always bring the newest bottle. Bring bottle, even if empty. Medication refills: You are responsible for knowing and keeping track of what medications you take and those you need refilled. The day before your appointment: write a list of all prescriptions that need to be refilled. The day of the appointment: give the list to the admitting nurse. Prescriptions will be written only during appointments. No prescriptions will be written on procedure days. If you forget a medication: it will not be "Called in", "Faxed", or "electronically sent". You will need to get another appointment to get these prescribed. No early refills. Do not call asking to have your prescription filled early. Prescription Accuracy: You are responsible for carefully inspecting your prescriptions before leaving our office. Have the discharge nurse carefully go over each prescription with you, before taking them home. Make sure that your name is accurately spelled, that your address is correct. Check the name and dose of your medication to make sure it is accurate. Check the number of pills, and the written instructions to make sure they are clear and accurate. Make sure that you are given enough medication to last until your next medication refill appointment. Taking Medication: Take medication as prescribed. When it comes to controlled substances, taking less pills or less frequently than prescribed is permitted and encouraged. Never take more pills than instructed. Never take medication more frequently than prescribed.  Inform other Doctors: Always inform, all of your healthcare providers, of all the medications you take. Pain  Medication from other Providers: You are not allowed to accept any additional pain medication from any other Doctor or Healthcare provider. There are two exceptions to this  rule. (see below) In the event that you require additional pain medication, you are responsible for notifying us, as stated below. Cough Medicine: Often these contain an opioid, such as codeine or hydrocodone. Never accept or take cough medicine containing these opioids if you are already taking an opioid* medication. The combination may cause respiratory failure and death. Medication Agreement: You are responsible for carefully reading and following our Medication Agreement. This must be signed before receiving any prescriptions from our practice. Safely store a copy of your signed Agreement. Violations to the Agreement will result in no further prescriptions. (Additional copies of our Medication Agreement are available upon request.) Laws, Rules, & Regulations: All patients are expected to follow all Federal and [employer], Rules, [employer]. Ignorance of the Laws does not constitute a valid excuse.  Illegal drugs and Controlled Substances: The use of illegal substances (including, but not limited to marijuana and its derivatives) and/or the illegal use of any controlled substances is strictly prohibited. Violation of this rule may result in the immediate and permanent discontinuation of any and all prescriptions being written by our practice. The use of any illegal substances is prohibited. Adopted CDC guidelines & recommendations: Target dosing levels will be at or below 60 MME/day. Use of benzodiazepines** is not recommended.  Exceptions: There are only two exceptions to the rule of not receiving pain medications from other Healthcare Providers. Exception #1 (Emergencies): In the event of an emergency (i.e.: accident requiring emergency care), you are allowed to receive additional pain medication. However, you are  responsible for: As soon as you are able, call our office [phone], at any time of the day or night, and leave a message stating your name, the date and nature of the emergency, and the name and dose of the medication prescribed. In the event that your call is answered by a member of our staff, make sure to document and save the date, time, and the name of the person that took your information.  Exception #2 (Planned Surgery): In the event that you are scheduled by another doctor or dentist to have any type of surgery or procedure, you are allowed (for a period no longer than 30 days), to receive additional pain medication, for the acute post-op pain. However, in this case, you are responsible for picking up a copy of our "Post-op Pain Management for Surgeons" handout, and giving it to your surgeon or dentist. This document is available at our office, and does not require an appointment to obtain it. Simply go to our office during business hours (Monday-Thursday from 8:00 AM to 4:00 PM) (Friday 8:00 AM to 12:00 Noon) or if you have a scheduled appointment with [place], prior to your surgery, and ask for it by name. In addition, you are responsible for: calling our office [phone], at any time of the day or night, and leaving a message stating your name, name of your surgeon, type of surgery, and date of procedure or surgery. Failure to comply with your responsibilities may result in termination of therapy involving the controlled substances.  *Opioid medications include: morphine, codeine, oxycodone, oxymorphone, hydrocodone, hydromorphone, meperidine, tramadol, tapentadol, buprenorphine, fentanyl, methadone. **Benzodiazepine medications include: diazepam (Valium), alprazolam (Xanax), clonazepam (Klonopine), lorazepam (Ativan), clorazepate (Tranxene), chlordiazepoxide (Librium), estazolam (Prosom), oxazepam (Serax), temazepam (Restoril), triazolam (Halcion) (Last updated:  04/19/2020) ____________________________________________________________________________________________  ____________________________________________________________________________________________  Medication Recommendations and Reminders  Applies to: All patients receiving prescriptions (written and/or electronic).  Medication Rules & Regulations: These rules and regulations exist for  your safety and that of others. They are not flexible and neither are we. Dismissing or ignoring them will be considered "non-compliance" with medication therapy, resulting in complete and irreversible termination of such therapy. (See document titled "Medication Rules" for more details.) In all conscience, because of safety reasons, we cannot continue providing a therapy where the patient does not follow instructions.  Pharmacy of record:  Definition: This is the pharmacy where your electronic prescriptions will be sent.  We do not endorse any particular pharmacy, however, we have experienced problems with [facility] not securing enough medication supply for the community. We do not restrict you in your choice of pharmacy. However, once we write for your prescriptions, we will NOT be re-sending more prescriptions to fix restricted supply problems created by your pharmacy, or your insurance.  The pharmacy listed in the electronic medical record should be the one where you want electronic prescriptions to be sent. If you choose to change pharmacy, simply notify our nursing staff.  Recommendations: Keep all of your pain medications in a safe place, under lock and key, even if you live alone. We will NOT replace lost, stolen, or damaged medication. After you fill your prescription, take 1 week's worth of pills and put them away in a safe place. You should keep a separate, properly labeled bottle for this purpose. The remainder should be kept in the original bottle. Use this as your primary supply, until it runs out.  Once it's gone, then you know that you have 1 week's worth of medicine, and it is time to come in for a prescription refill. If you do this correctly, it is unlikely that you will ever run out of medicine. To make sure that the above recommendation works, it is very important that you make sure your medication refill appointments are scheduled at least 1 week before you run out of medicine. To do this in an effective manner, make sure that you do not leave the office without scheduling your next medication management appointment. Always ask the nursing staff to show you in your prescription , when your medication will be running out. Then arrange for the receptionist to get you a return appointment, at least 7 days before you run out of medicine. Do not wait until you have 1 or 2 pills left, to come in. This is very poor planning and does not take into consideration that we may need to cancel appointments due to bad weather, sickness, or emergencies affecting our staff. DO NOT ACCEPT A "Partial Fill": If for any reason your pharmacy does not have enough pills/tablets to completely fill or refill your prescription, do not allow for a "partial fill". The law allows the pharmacy to complete that prescription within 72 hours, without requiring a new prescription. If they do not fill the rest of your prescription within those 72 hours, you will need a separate prescription to fill the remaining amount, which we will NOT provide. If the reason for the partial fill is your insurance, you will need to talk to the pharmacist about payment alternatives for the remaining tablets, but again, DO NOT ACCEPT A PARTIAL FILL, unless you can trust your pharmacist to obtain the remainder of the pills within 72 hours.  Prescription refills and/or changes in medication(s):  Prescription refills, and/or changes in dose or medication, will be conducted only during scheduled medication management appointments. (Applies to both,  written and electronic prescriptions.) No refills on procedure days. No medication will be changed or started  on procedure days. No changes, adjustments, and/or refills will be conducted on a procedure day. Doing so will interfere with the diagnostic portion of the procedure. No phone refills. No medications will be "called into the pharmacy". No Fax refills. No weekend refills. No Holliday refills. No after hours refills.  Remember:  Business hours are:  Monday to Thursday 8:00 AM to 4:00 PM Provider's Schedule: Milinda Pointer, MD - Appointments are:  Medication management: Monday and Wednesday 8:00 AM to 4:00 PM Procedure day: Tuesday and Thursday 7:30 AM to 4:00 PM Gillis Santa, MD - Appointments are:  Medication management: Tuesday and Thursday 8:00 AM to 4:00 PM Procedure day: Monday and Wednesday 7:30 AM to 4:00 PM (Last update: 12/10/2019) ____________________________________________________________________________________________

## 2020-12-07 ENCOUNTER — Encounter: Payer: Self-pay | Admitting: Pain Medicine

## 2020-12-09 LAB — COMPLIANCE DRUG ANALYSIS, UR

## 2020-12-10 ENCOUNTER — Other Ambulatory Visit: Payer: Self-pay

## 2020-12-11 ENCOUNTER — Other Ambulatory Visit: Payer: Self-pay

## 2020-12-13 ENCOUNTER — Other Ambulatory Visit: Payer: Self-pay

## 2020-12-13 ENCOUNTER — Other Ambulatory Visit (HOSPITAL_COMMUNITY): Payer: Self-pay

## 2020-12-13 LAB — COMP. METABOLIC PANEL (12)
AST: 20 IU/L (ref 0–40)
Albumin/Globulin Ratio: 2.4 — ABNORMAL HIGH (ref 1.2–2.2)
Albumin: 4.4 g/dL (ref 3.8–4.8)
Alkaline Phosphatase: 52 IU/L (ref 44–121)
BUN/Creatinine Ratio: 22 (ref 9–23)
BUN: 17 mg/dL (ref 6–24)
Bilirubin Total: <0.2 mg/dL (ref 0.0–1.2)
Calcium: 9.1 mg/dL (ref 8.7–10.2)
Chloride: 101 mmol/L (ref 96–106)
Creatinine, Ser: 0.78 mg/dL (ref 0.57–1.00)
Globulin, Total: 1.8 g/dL (ref 1.5–4.5)
Glucose: 97 mg/dL (ref 65–99)
Potassium: 4.4 mmol/L (ref 3.5–5.2)
Sodium: 138 mmol/L (ref 134–144)
Total Protein: 6.2 g/dL (ref 6.0–8.5)
eGFR: 94 mL/min/{1.73_m2} (ref >59)

## 2020-12-13 LAB — 25-HYDROXY VITAMIN D LCMS D2+D3
25-Hydroxy, Vitamin D-2: <1 ng/mL
25-Hydroxy, Vitamin D-3: 51 ng/mL
25-Hydroxy, Vitamin D: 51 ng/mL

## 2020-12-13 LAB — MAGNESIUM: Magnesium: 2.2 mg/dL (ref 1.6–2.3)

## 2020-12-13 LAB — C-REACTIVE PROTEIN: CRP: <1 mg/L (ref 0–10)

## 2020-12-13 LAB — VITAMIN B12: Vitamin B-12: 584 pg/mL (ref 232–1245)

## 2020-12-13 LAB — SEDIMENTATION RATE: Sed Rate: 9 mm/hr (ref 0–32)

## 2020-12-14 ENCOUNTER — Other Ambulatory Visit (HOSPITAL_COMMUNITY): Payer: Self-pay

## 2020-12-15 ENCOUNTER — Other Ambulatory Visit: Payer: Self-pay | Admitting: Nurse Practitioner

## 2020-12-15 ENCOUNTER — Encounter: Payer: Self-pay | Admitting: Nurse Practitioner

## 2020-12-15 ENCOUNTER — Other Ambulatory Visit (HOSPITAL_COMMUNITY): Payer: Self-pay

## 2020-12-15 MED ORDER — METHOCARBAMOL 750 MG PO TABS
ORAL_TABLET | Freq: Three times a day (TID) | ORAL | 2 refills | Status: DC
  Filled 2020-12-15: qty 90, 30d supply, fill #0
  Filled 2021-03-02: qty 90, 30d supply, fill #1

## 2020-12-15 MED ORDER — AMITRIPTYLINE HCL 50 MG PO TABS
ORAL_TABLET | Freq: Every day | ORAL | 1 refills | Status: DC
  Filled 2020-12-15: qty 90, 90d supply, fill #0
  Filled 2021-03-17: qty 90, 90d supply, fill #1

## 2020-12-15 NOTE — Telephone Encounter (Signed)
Ok to refill 

## 2020-12-21 ENCOUNTER — Other Ambulatory Visit (HOSPITAL_BASED_OUTPATIENT_CLINIC_OR_DEPARTMENT_OTHER): Payer: Self-pay

## 2020-12-21 ENCOUNTER — Other Ambulatory Visit: Payer: Self-pay | Admitting: Nurse Practitioner

## 2020-12-21 ENCOUNTER — Other Ambulatory Visit (HOSPITAL_COMMUNITY): Payer: Self-pay

## 2020-12-21 MED ORDER — SUMATRIPTAN SUCCINATE 100 MG PO TABS
ORAL_TABLET | ORAL | 3 refills | Status: DC
  Filled 2020-12-21: qty 9, 30d supply, fill #0
  Filled 2021-12-08: qty 9, 30d supply, fill #1

## 2020-12-22 ENCOUNTER — Other Ambulatory Visit (HOSPITAL_COMMUNITY): Payer: Self-pay

## 2020-12-23 ENCOUNTER — Other Ambulatory Visit (HOSPITAL_COMMUNITY): Payer: Self-pay

## 2020-12-23 MED ORDER — AZITHROMYCIN 500 MG PO TABS
ORAL_TABLET | ORAL | 0 refills | Status: DC
  Filled 2020-12-23 (×2): qty 4, 3d supply, fill #0

## 2021-01-03 ENCOUNTER — Other Ambulatory Visit: Payer: Self-pay | Admitting: Family Medicine

## 2021-01-04 ENCOUNTER — Encounter: Payer: Self-pay | Admitting: Nurse Practitioner

## 2021-01-04 ENCOUNTER — Other Ambulatory Visit (HOSPITAL_COMMUNITY): Payer: Self-pay

## 2021-01-04 DIAGNOSIS — M797 Fibromyalgia: Secondary | ICD-10-CM

## 2021-01-05 ENCOUNTER — Other Ambulatory Visit (HOSPITAL_COMMUNITY): Payer: Self-pay

## 2021-01-05 MED ORDER — TRAMADOL HCL 50 MG PO TABS
100.0000 mg | ORAL_TABLET | Freq: Two times a day (BID) | ORAL | 0 refills | Status: DC | PRN
  Filled 2021-01-05: qty 120, 30d supply, fill #0

## 2021-01-07 ENCOUNTER — Other Ambulatory Visit (HOSPITAL_COMMUNITY): Payer: Self-pay

## 2021-01-07 ENCOUNTER — Telehealth: Payer: No Typology Code available for payment source | Admitting: Physician Assistant

## 2021-01-07 DIAGNOSIS — B86 Scabies: Secondary | ICD-10-CM | POA: Diagnosis not present

## 2021-01-07 MED ORDER — PERMETHRIN 5 % EX CREA
TOPICAL_CREAM | CUTANEOUS | 0 refills | Status: DC
  Filled 2021-01-07: qty 60, 1d supply, fill #0

## 2021-01-07 NOTE — Progress Notes (Signed)
E-Visit for Scabies  We are sorry that you are not feeling well. Here is how we plan to help!  Based on what you shared with me it looks like you have scabies.  Scabies is an infection caused by very tiny mites that burrow into the skin.  They cause severe itching. Though children are most commonly infected, anyone can get scabies.  Scabies mites can pass from person to person through physical close contact.  They can also be passed through shared clothing, towels, and bedding.  Scabies infection is not usually dangerous, but it is uncomfortable.  Because it is so contagious, scabies should be treated immediately to keep the infection from spreading.  If you have children in day care, please have any exposed children examined by their pediatrician. .   I have prescribed Permethrin topical cream 5% thoroughly massage cream from head to soles of feet; leave on for 8 to 14 hours before removing (shower or bath). May repeat if living mites are observed 14 days after first treatment; one application is generally curative.   I do apologize, we do not prescribe Ivermectin for scabies through the digital platform. This would need to be prescribed by your PCP if they agreed with treatment. You should be monitored with taking Ivermectin, as well as this is not an FDA approved use.   HOME CARE:  You should treat all members in your household whether they show symptoms or not. Wash towels, clothes, bed linens, cloth toys, hats, and other personal items in hot water and dry on high heat. Vacuum floors and furniture and throw away the bag afterwards. Keep your child home from day care or school until the morning after treatment for scabies. Notify your child's day care or school so that other children can be checked and treated.  GET HELP RIGHT AWAY IF:  The infected person has a fever, red streaks, pain, or swelling of the skin. Sores get worse or don't heal. New rashes appear or itching continues for  more than 2 weeks after treatment.  MAKE SURE YOU:  Understand these instructions. Will watch your condition. Will get help right away if you are not doing well or get worse.  Thank you for choosing an e-visit.  Your e-visit answers were reviewed by a board certified advanced clinical practitioner to complete your personal care plan. Depending upon the condition, your plan could have included both over the counter or prescription medications.  Please review your pharmacy choice. Make sure the pharmacy is open so you can pick up prescription now. If there is a problem, you may contact your provider through [employer] and have the prescription routed to another pharmacy.  Your safety is important to [place]. If you have drug allergies check your prescription carefully.   For the next 24 hours you can use MyChart to ask questions about today's visit, request a non-urgent call back, or ask for a work or school excuse. You will get an email in the next two days asking about your experience. I hope that your e-visit has been valuable and will speed your recovery.  I provided 6 minutes of non face-to-face time during this encounter for chart review and documentation.

## 2021-01-12 ENCOUNTER — Ambulatory Visit: Payer: No Typology Code available for payment source | Admitting: Pain Medicine

## 2021-01-13 ENCOUNTER — Encounter: Payer: Self-pay | Admitting: Nurse Practitioner

## 2021-01-13 ENCOUNTER — Telehealth (INDEPENDENT_AMBULATORY_CARE_PROVIDER_SITE_OTHER): Payer: No Typology Code available for payment source | Admitting: Nurse Practitioner

## 2021-01-13 ENCOUNTER — Other Ambulatory Visit (HOSPITAL_COMMUNITY): Payer: Self-pay

## 2021-01-13 ENCOUNTER — Other Ambulatory Visit: Payer: Self-pay

## 2021-01-13 ENCOUNTER — Telehealth: Payer: No Typology Code available for payment source | Admitting: Family Medicine

## 2021-01-13 DIAGNOSIS — B86 Scabies: Secondary | ICD-10-CM

## 2021-01-13 MED ORDER — IVERMECTIN 3 MG PO TABS
200.0000 ug/kg | ORAL_TABLET | Freq: Once | ORAL | 0 refills | Status: DC
  Filled 2021-01-13: qty 4, 1d supply, fill #0

## 2021-01-13 MED ORDER — IVERMECTIN 3 MG PO TABS: 200.0000 ug/kg | ORAL_TABLET | Freq: Once | ORAL | 0 refills | Status: AC

## 2021-01-13 NOTE — Progress Notes (Addendum)
Subjective:    Patient ID: Karen Schwartz, female    DOB: 11/07/1970, 50 y.o.   MRN: [id number]  HPI: Karen Schwartz is a 50 y.o. female presenting virtually for rash.  Chief Complaint  Patient presents with   Rash   RASH Duration:  days  Location: trunk, arms, legs, and groin  Itching: yes Burning: no Redness: yes Oozing: no Scaling: no Blisters: no Painful: no Fevers: no Change in detergents/soaps/personal care products: no Recent illness: no Recent travel: yes, got back from [place] last weekend History of same: yes Context: better, then worse again Alleviating factors:  Treatments attempted: permethrin cream Shortness of breath: no  Throat/tongue swelling: no Myalgias/arthralgias: no, no change from baseline  Allergies  Allergen Reactions   Ampicillin    Latex Itching   Lyrica [Pregabalin]    Zantac [Ranitidine Hcl]     Tachycardia    Outpatient Encounter Medications as of 01/13/2021  Medication Sig   [DISCONTINUED] ivermectin (STROMECTOL) 3 MG TABS tablet Take 4 tablets (12,000 mcg total) by mouth once for 1 dose.   amitriptyline (ELAVIL) 50 MG tablet TAKE 1 TABLET BY MOUTH AT BEDTIME.   azithromycin (ZITHROMAX) 500 MG tablet For nonbloody diarrhea, take 2 pills on day 1. If resolved, stop medication. If diarrhea persists, take 1 pill on day 2 and 3. For bloody diarrhea, take 2 pills on day 1 and 1 pill on day 2 and 3.   guaiFENesin (MUCINEX) 600 MG 12 hr tablet Take 600 mg by mouth 2 (two) times daily. PRN   hydrOXYzine (ATARAX/VISTARIL) 25 MG tablet Take 1 tablet (25 mg total) by mouth 3 (three) times daily as needed.   ivermectin (STROMECTOL) 3 MG TABS tablet Take 4 tablets (12,000 mcg total) by mouth once for 1 dose.   methocarbamol (ROBAXIN) 750 MG tablet TAKE 1 TABLET BY MOUTH 3 TIMES DAILY   metoprolol succinate (TOPROL-XL) 25 MG 24 hr tablet TAKE 1/2 TABLET BY MOUTH EVERY MORNING AND 1 TABLET BY MOUTH EVERY EVENING (Patient taking differently:  Take 1/2 every evening and 1 tablet every morning.)   naproxen (NAPROSYN) 500 MG tablet Take 500 mg by mouth 2 (two) times daily with a meal.   permethrin (ELIMITE) 5 % cream Thoroughly massage cream from head to soles of feet; leave on for 8 to 14 hours before removing (shower or bath). May repeat if living mites are observed 14 days after first treatment   polyethylene glycol (MIRALAX / GLYCOLAX) packet Take 17 g by mouth daily.   Sodium Fluoride 1.1 % PSTE USE AS TOOTHPASTE FOR BRUSHING   SUMAtriptan (IMITREX) 100 MG tablet TAKE 1 TABLET BY MOUTH EVERY 2 HOURS AS NEEDED AS DIRECTED   traMADol (ULTRAM) 50 MG tablet Take 2 tablets (100 mg total) by mouth every 12 (twelve) hours as needed for moderate pain.   No facility-administered encounter medications on file as of 01/13/2021.    Patient Active Problem List   Diagnosis Date Noted   Atopic dermatitis 12/06/2020   Contact dermatitis and other eczema due to other chemical products 12/06/2020   Myalgia and myositis 12/06/2020   Other chronic allergic conjunctivitis 12/06/2020   Chronic pain syndrome 12/06/2020   Pharmacologic therapy 12/06/2020   Disorder of skeletal system 12/06/2020   Problems influencing health status 12/06/2020   Chronic thoracic back pain (1ry area of Pain) (Bilateral) (L>R) 12/06/2020   Myofascial pain syndrome of thoracic spine 12/06/2020   Chronic constipation 02/11/2018   Bloating 02/11/2018  Raynauds phenomenon 10/02/2017   Migraines 10/02/2017   IBS (irritable bowel syndrome) 08/03/2016   Fibromyalgia 08/02/2016   Tachycardia 08/02/2016   Eczema, allergic 05/12/2015   Environmental allergies 05/12/2015   Hypertension     Past Medical History:  Diagnosis Date   Chronic headaches    Constipation    Eczema    Fibromyalgia    Hypertension    boderline   IBS (irritable bowel syndrome)     Relevant past medical, surgical, family and social history reviewed and updated as indicated. Interim medical  history since our last visit reviewed.  Review of Systems Per HPI unless specifically indicated above     Objective:    There were no vitals taken for this visit.  Wt Readings from Last 3 Encounters:  12/06/20 134 lb (60.8 kg)  08/24/20 134 lb 6.4 oz (61 kg)  05/03/20 139 lb 6.4 oz (63.2 kg)    Physical Exam Vitals and nursing note reviewed.  Constitutional:      General: She is not in acute distress.    Appearance: Normal appearance. She is not toxic-appearing.  Skin:    Coloration: Skin is not jaundiced or pale.     Findings: Erythema and rash present.     Comments: Slightly erythematous, papular-appearing rash noted with excoriation marks; linear appearing in some areas  Neurological:     Mental Status: She is alert and oriented to person, place, and time.  Psychiatric:        Mood and Affect: Mood normal.        Behavior: Behavior normal.        Thought Content: Thought content normal.        Judgment: Judgment normal.      Assessment & Plan:  1. Scabies Has tried 2 courses of permethrin cream with some improvement, then symptoms recur.  Ivermectin x 1 dose given for presumed scabies.  Spouse will also need to be treated.  If symptoms do not improve, will need in-person evaluation.  Unfortunately, we could not offer in-person visit today secondary to staffing issues.  - ivermectin (STROMECTOL) 3 MG TABS tablet; Take 4 tablets (12,000 mcg total) by mouth once for 1 dose.  Dispense: 4 tablet; Refill: 0   Follow up plan: Return if symptoms worsen or fail to improve.  Due to the catastrophic nature of the COVID-19 pandemic, this video visit was completed soley via audio and visual contact via [employer] due to the restrictions of the COVID-19 pandemic.  All issues as above were discussed and addressed. Physical exam was done as above through visual confirmation on [employer]. If it was felt that the patient should be evaluated in the office, they were directed there. The  patient verbally consented to this visit. Location of the patient: home Location of the provider: work Those involved with this call:  Provider: Noemi Chapel, DNP, FNP-C CMA: n/a Front Desk/Registration: Vevelyn Pat  Time spent on call:  8 minutes with patient face to face via video conference. More than 50% of this time was spent in counseling and coordination of care. 13 minutes total spent in review of patient's record and preparation of their chart. I verified patient identity using two factors (patient name and date of birth). Patient consents verbally to being seen via telemedicine visit today.

## 2021-01-13 NOTE — Progress Notes (Signed)
[facility]  Pt referred to in person PCP visit for oral treatment needs

## 2021-01-21 ENCOUNTER — Other Ambulatory Visit: Payer: Self-pay | Admitting: Pain Medicine

## 2021-01-21 ENCOUNTER — Ambulatory Visit
Admission: RE | Admit: 2021-01-21 | Discharge: 2021-01-21 | Disposition: A | Discharge status: Home / Self Care | Payer: No Typology Code available for payment source | Source: Ambulatory Visit | Attending: Pain Medicine | Admitting: Pain Medicine

## 2021-01-21 DIAGNOSIS — G8929 Other chronic pain: Secondary | ICD-10-CM

## 2021-01-21 DIAGNOSIS — M546 Pain in thoracic spine: Secondary | ICD-10-CM | POA: Insufficient documentation

## 2021-02-05 NOTE — Progress Notes (Signed)
PROVIDER NOTE: Information contained herein reflects review and annotations entered in association with encounter. Interpretation of such information and data should be left to medically-trained personnel. Information provided to patient can be located elsewhere in the medical record under "Patient Instructions". Document created using STT-dictation technology, any transcriptional errors that may result from process are unintentional.    Patient: Karen Schwartz  Service Category: E/M  Provider: Gaspar Cola, MD  DOB: 1971/02/04  DOS: 02/07/2021  Specialty: Interventional Pain Management  MRN: [id number]  Setting: Ambulatory outpatient  PCP: Eulogio Bear, NP  Type: Established Patient    Referring Provider: Eulogio Bear, NP  Location: Office  Delivery: Face-to-face     Primary Reason(s) for Visit: Encounter for evaluation before starting new chronic pain management plan of care (Level of risk: moderate) CC: Back Pain  HPI  Karen Schwartz is a 50 y.o. year old, female patient, who comes today for a follow-up evaluation to review the test results and decide on a treatment plan. She has Hypertension; Eczema, allergic; Environmental allergies; Fibromyalgia; Tachycardia; IBS (irritable bowel syndrome); Raynauds phenomenon; Migraines; Chronic constipation; Bloating; Atopic dermatitis; Contact dermatitis and other eczema due to other chemical products; Myalgia and myositis; Other chronic allergic conjunctivitis; Chronic pain syndrome; Pharmacologic therapy; Disorder of skeletal system; Problems influencing health status; Chronic thoracic back pain (1ry area of Pain) (Bilateral) (L>R); Myofascial pain syndrome of thoracic spine; Thoracic facet syndrome (Bilateral); Chronic upper back pain (Bilateral); and Spondylosis without myelopathy or radiculopathy, thoracic region on their problem list. Her primarily concern today is the Back Pain  Pain Assessment: Location: Left Back Radiating:  Denies Onset: More than a month ago Duration: Chronic pain Quality: Aching, Burning, Tender Severity: 4 /10 (subjective, self-reported pain score)  Effect on ADL: limits my daily activities Timing: Constant Modifying factors: meds, massage, heat, moving around BP: 124/76  HR: 99  Karen Schwartz comes in today for a follow-up visit after her initial evaluation on 12/06/2020. Today we went over the results of her tests. These were explained in "Layman's terms". During today's appointment we went over my diagnostic impression, as well as the proposed treatment plan.  According to the patient the primary area of pain is that of the mid upper back (Bilateral) (L>R).  The patient denies any prior surgeries, nerve blocks, recent x-rays, but she does admit to having tried physical therapy which made the pain worse.  She indicated having had a full work-up around 2003-4 while she was at [facility].  Today we will have the patient sign some release form so that we can request some of that information.   The patient's secondary area pain is that of monthly migraines which usually respond well to Imitrex.   In addition, the patient has a diagnosis of fibromyalgia which she indicates was gradually changed into that diagnosis from under her general diagnosis of myofascial pain syndrome.  She indicates that this was done by her primary care physician because she was having chronic fatigue.  However, in reviewing the patient's areas of pain, she does not fit the diagnostic criteria for fibromyalgia.  However, she does fit the diagnostic criteria for myofascial pain syndrome affecting the thoracic paravertebral muscles.    In considering the treatment plan options, Karen Schwartz was reminded that I no longer take patients for medication management only. I asked her to let me know if she had no intention of taking advantage of the interventional therapies, so that we could make arrangements  to provide this space to someone interested. I also made it clear that undergoing interventional therapies for the purpose of getting pain medications is very inappropriate on the part of a patient, and it will not be tolerated in this practice. This type of behavior would suggest true addiction and therefore it requires referral to an addiction specialist.   Further details on both, my assessment(s), as well as the proposed treatment plan, please see below.  Controlled Substance Pharmacotherapy Assessment REMS (Risk Evaluation and Mitigation Strategy)  Analgesic: Tramadol 50 mg, 2 tabs p.o. twice daily (200 mg/day of tramadol) (20 MME)  MME/day: 20 mg/day  Pill Count: None expected due to no prior prescriptions written by our practice. Chauncey Fischer, RN  02/07/2021  1:28 PM  Sign when Signing Visit Nursing Pain Medication Assessment:  Safety precautions to be maintained throughout the outpatient stay will include: orient to surroundings, keep bed in low position, maintain call bell within reach at all times, provide assistance with transfer out of bed and ambulation.  Medication Inspection Compliance: Pill count conducted under aseptic conditions, in front of the patient. Neither the pills nor the bottle was removed from the patient's sight at any time. Once count was completed pills were immediately returned to the patient in their original bottle.  Medication: Tramadol (Ultram) Pill/Patch Count:  0 of 120 pills remain Pill/Patch Appearance: Markings consistent with prescribed medication Bottle Appearance: Standard pharmacy container. Clearly labeled. Filled Date: 8 / 17 / 2022 Last Medication intake:  Today Safety precautions to be maintained throughout the outpatient stay will include: orient to surroundings, keep bed in low position, maintain call bell within reach at all times, provide assistance with transfer out of bed and ambulation.     Pharmacokinetics: Liberation and absorption  (onset of action): WNL Distribution (time to peak effect): WNL Metabolism and excretion (duration of action): WNL         Pharmacodynamics: Desired effects: Analgesia: Karen Schwartz reports >50% benefit. Functional ability: Patient reports that medication allows her to accomplish basic ADLs Clinically meaningful improvement in function (CMIF): Sustained CMIF goals met Perceived effectiveness: Described as relatively effective, allowing for increase in activities of daily living (ADL) Undesirable effects: Side-effects or Adverse reactions: None reported Monitoring: [place] PMP: PDMP reviewed during this encounter. Online review of the past 35-month period previously conducted. Not applicable at this point since we have not taken over the patient's medication management yet. List of other Serum/Urine Drug Screening Test(s):  No results found for: AMPHSCRSER, BARBSCRSER, BENZOSCRSER, COCAINSCRSER, COCAINSCRNUR, PCPSCRSER, THCSCRSER, THCU, CANNABQUANT, [place], [place], [place], [place] List of all UDS test(s) done:  Lab Results  Component Value Date   SUMMARY Note 12/06/2020   Last UDS on record: Summary  Date Value Ref Range Status  12/06/2020 Note  Final    Comment:    ==================================================================== Compliance Drug Analysis, Ur ==================================================================== Test                             Result       Flag       Units  Drug Present and Declared for Prescription Verification   Tramadol                       4360         EXPECTED   ng/mg creat   O-Desmethyltramadol            6535  EXPECTED   ng/mg creat   N-Desmethyltramadol            3486         EXPECTED   ng/mg creat    Source of tramadol is a prescription medication. O-desmethyltramadol    and N-desmethyltramadol are expected metabolites of tramadol.    Methocarbamol                  PRESENT      EXPECTED   Guaifenesin                     PRESENT      EXPECTED    Guaifenesin may be administered as an over-the-counter or    prescription drug; it may also be present as a breakdown product of    methocarbamol.    Amitriptyline                  PRESENT      EXPECTED   Nortriptyline                  PRESENT      EXPECTED    Nortriptyline is an expected metabolite of amitriptyline.    Naproxen                       PRESENT      EXPECTED   Metoprolol                     PRESENT      EXPECTED  Drug Absent but Declared for Prescription Verification   Hydroxyzine                    Not Detected UNEXPECTED ==================================================================== Test                      Result    Flag   Units      Ref Range   Creatinine              72               mg/dL      >=20 ==================================================================== Declared Medications:  The flagging and interpretation on this report are based on the  following declared medications.  Unexpected results may arise from  inaccuracies in the declared medications.   **Note: The testing scope of this panel includes these medications:   Amitriptyline  Guaifenesin  Hydroxyzine  Methocarbamol  Metoprolol  Naproxen  Tramadol   **Note: The testing scope of this panel does not include the  following reported medications:   Polyethylene Glycol (MiraLAX)  Sumatriptan ==================================================================== For clinical consultation, please call [phone]. ====================================================================    UDS interpretation: No unexpected findings.          Medication Assessment Form: Patient introduced to form today Treatment compliance: Treatment may start today if patient agrees with proposed plan. Evaluation of compliance is not applicable at this point Risk Assessment Profile: Aberrant behavior: See initial evaluations. None observed or detected today Comorbid factors  increasing risk of overdose: See initial evaluation. No additional risks detected today Opioid risk tool (ORT):  Opioid Risk  02/07/2021  Alcohol 0  Illegal Drugs 0  Rx Drugs 0  Alcohol 0  Illegal Drugs 0  Rx Drugs 0  Age between 16-45 years  0  History of Preadolescent Sexual Abuse 0  Psychological Disease 0  Depression 0  Opioid Risk Tool  Scoring 0  Opioid Risk Interpretation Low Risk    ORT Scoring interpretation table:  Score <3 = Low Risk for SUD  Score between 4-7 = Moderate Risk for SUD  Score >8 = High Risk for Opioid Abuse   Risk of substance use disorder (SUD): Low  Risk Mitigation Strategies:  Patient opioid safety counseling: Completed today. Counseling provided to patient as per "Patient Counseling Document". Document signed by patient, attesting to counseling and understanding Patient-Prescriber Agreement (PPA): Obtained today.  Controlled substance notification to other providers: Written and sent today.  Pharmacologic Plan: Today we may be taking over the patient's pharmacological regimen. See below.             Laboratory Chemistry Profile   Renal Lab Results  Component Value Date   BUN 17 12/06/2020   CREATININE 0.78 12/06/2020   BCR 22 12/06/2020   GFRAA 92 03/22/2018   GFRNONAA 79 03/22/2018     Electrolytes Lab Results  Component Value Date   NA 138 12/06/2020   K 4.4 12/06/2020   CL 101 12/06/2020   CALCIUM 9.1 12/06/2020   MG 2.2 12/06/2020     Hepatic Lab Results  Component Value Date   AST 20 12/06/2020   ALT 10 04/27/2020   ALBUMIN 4.4 12/06/2020   ALKPHOS 52 12/06/2020     ID No results found for: LYMEIGGIGMAB, HIV, SARSCOV2NAA, STAPHAUREUS, MRSAPCR, HCVAB, PREGTESTUR, RMSFIGG, QFVRPH1IGG, QFVRPH2IGG, [facility]   Bone Lab Results  Component Value Date   VD25OH 77 04/27/2020   25OHVITD1 51 12/06/2020   25OHVITD2 <1.0 12/06/2020   25OHVITD3 51 12/06/2020     Endocrine Lab Results  Component Value Date   GLUCOSE 97  12/06/2020   TSH 5.83 (H) 04/27/2020   FREET4 1.2 04/27/2020     Neuropathy Lab Results  Component Value Date   VITAMINB12 584 12/06/2020     CNS No results found for: COLORCSF, APPEARCSF, RBCCOUNTCSF, WBCCSF, POLYSCSF, LYMPHSCSF, EOSCSF, PROTEINCSF, GLUCCSF, JCVIRUS, CSFOLI, IGGCSF, LABACHR, ACETBL, LABACHR, ACETBL   Inflammation (CRP: Acute  ESR: Chronic) Lab Results  Component Value Date   CRP <1 12/06/2020   ESRSEDRATE 9 12/06/2020     Rheumatology No results found for: RF, ANA, LABURIC, URICUR, LYMEIGGIGMAB, LYMEABIGMQN, HLAB27   Coagulation Lab Results  Component Value Date   PLT 267 04/27/2020     Cardiovascular Lab Results  Component Value Date   HGB 14.2 04/27/2020   HCT 42.7 04/27/2020     Screening No results found for: SARSCOV2NAA, COVIDSOURCE, STAPHAUREUS, MRSAPCR, HCVAB, HIV, PREGTESTUR   Cancer No results found for: CEA, CA125, LABCA2   Allergens Lab Results  Component Value Date   APPLE <0.10 09/01/2015       Note: Lab results reviewed.  Recent Diagnostic Imaging Review  Thoracic Imaging: Thoracic DG w/swimmers view: Results for orders placed during the hospital encounter of 01/21/21 [facility] Thoracic Spine W/Swimmers  Narrative CLINICAL DATA:  Chronic lower left thoracic spine pain. No known injury.  EXAM: THORACIC SPINE - 3 VIEWS  COMPARISON:  None.  FINDINGS: There is no evidence of thoracic spine fracture. Alignment is normal. Minimal anterior osteophyte formation is noted at multiple levels in the lower thoracic spine without significant disc space narrowing.  IMPRESSION: Minimal degenerative changes are noted.  No acute abnormality seen.   Electronically Signed By: Marijo Conception M.D. On: 01/24/2021 11:32  Complexity Note: Imaging results reviewed. Results shared with Karen Schwartz, using Layman's terms.  Meds   Current Outpatient Medications:    amitriptyline (ELAVIL) 50 MG tablet, TAKE 1 TABLET BY  MOUTH AT BEDTIME., Disp: 90 tablet, Rfl: 1   diazepam (VALIUM) 5 MG tablet, Take one (1) tab (5 mg) 60 minutes before scheduled procedure. Wait 30 minutes. If still anxious, take the second (5 mg) tab 30 min before procedure., Disp: 2 tablet, Rfl: 0   guaiFENesin (MUCINEX) 600 MG 12 hr tablet, Take 600 mg by mouth 2 (two) times daily. PRN, Disp: , Rfl:    hydrOXYzine (ATARAX/VISTARIL) 25 MG tablet, Take 1 tablet (25 mg total) by mouth 3 (three) times daily as needed., Disp: 45 tablet, Rfl: 2   methocarbamol (ROBAXIN) 750 MG tablet, TAKE 1 TABLET BY MOUTH 3 TIMES DAILY, Disp: 90 tablet, Rfl: 2   metoprolol succinate (TOPROL-XL) 25 MG 24 hr tablet, TAKE 1/2 TABLET BY MOUTH EVERY MORNING AND 1 TABLET BY MOUTH EVERY EVENING (Patient taking differently: Take 1/2 every evening and 1 tablet every morning.), Disp: 135 tablet, Rfl: 3   naproxen (NAPROSYN) 500 MG tablet, Take 500 mg by mouth 2 (two) times daily with a meal., Disp: , Rfl:    permethrin (ELIMITE) 5 % cream, Thoroughly massage cream from head to soles of feet; leave on for 8 to 14 hours before removing (shower or bath). May repeat if living mites are observed 14 days after first treatment, Disp: 60 g, Rfl: 0   polyethylene glycol (MIRALAX / GLYCOLAX) packet, Take 17 g by mouth daily., Disp: , Rfl:    Sodium Fluoride 1.1 % PSTE, USE AS TOOTHPASTE FOR BRUSHING, Disp: 100 mL, Rfl: 4   SUMAtriptan (IMITREX) 100 MG tablet, TAKE 1 TABLET BY MOUTH EVERY 2 HOURS AS NEEDED AS DIRECTED, Disp: 9 tablet, Rfl: 3   traMADol (ULTRAM) 50 MG tablet, Take 1 tablet (50 mg total) by mouth every 6 (six) hours as needed for severe pain. Must last 30 days, Disp: 120 tablet, Rfl: 0  ROS  Constitutional: Denies any fever or chills Gastrointestinal: No reported hemesis, hematochezia, vomiting, or acute GI distress Musculoskeletal: Denies any acute onset joint swelling, redness, loss of ROM, or weakness Neurological: No reported episodes of acute onset apraxia, aphasia,  dysarthria, agnosia, amnesia, paralysis, loss of coordination, or loss of consciousness  Allergies  Karen Schwartz is allergic to ampicillin, latex, lyrica [pregabalin], and zantac [ranitidine hcl].  PFSH  Drug: Karen Schwartz  reports no history of drug use. Alcohol:  reports no history of alcohol use. Tobacco:  reports that she quit smoking about 21 years ago. Her smoking use included cigarettes. She has a 12.00 pack-year smoking history. She has never used smokeless tobacco. Medical:  has a past medical history of Chronic headaches, Constipation, Eczema, Fibromyalgia, Hypertension, and IBS (irritable bowel syndrome). Surgical: Karen Schwartz  has no past surgical history on file. Family: family history includes Alcohol abuse in her brother; Atrial fibrillation in her father; Cancer in her father; Dementia in her maternal grandfather; Depression in her mother and sister; Diabetes in her maternal grandmother; Hypertension in her mother and sister; Macular degeneration in her maternal grandmother; Mental illness in her brother; Stroke in her mother.  Constitutional Exam  General appearance: Well nourished, well developed, and well hydrated. In no apparent acute distress Vitals:   02/07/21 1323  BP: 124/76  Pulse: 99  Temp: (!) 97.3 F (36.3 C)  SpO2: 100%  Weight: 134 lb (60.8 kg)  Height: $Remove'5\' 2"'zdmOqZG$  (1.575 m)   BMI Assessment: Estimated body mass index is  24.51 kg/m as calculated from the following:   Height as of this encounter: $RemoveBeforeD'5\' 2"'SnFRnIbYXkzoUn$  (1.575 m).   Weight as of this encounter: 134 lb (60.8 kg).  BMI interpretation table: BMI level Category Range association with higher incidence of chronic pain  <18 kg/m2 Underweight   18.5-24.9 kg/m2 Ideal body weight   25-29.9 kg/m2 Overweight Increased incidence by 20%  30-34.9 kg/m2 Obese (Class I) Increased incidence by 68%  35-39.9 kg/m2 Severe obesity (Class II) Increased incidence by 136%  >40 kg/m2 Extreme obesity (Class III) Increased incidence by  254%   Patient's current BMI Ideal Body weight  Body mass index is 24.51 kg/m. Ideal body weight: 50.1 kg (110 lb 7.2 oz) Adjusted ideal body weight: 54.4 kg (119 lb 13.9 oz)   BMI Readings from Last 4 Encounters:  02/07/21 24.51 kg/m  12/06/20 24.51 kg/m  08/24/20 24.58 kg/m  05/03/20 25.50 kg/m   Wt Readings from Last 4 Encounters:  02/07/21 134 lb (60.8 kg)  12/06/20 134 lb (60.8 kg)  08/24/20 134 lb 6.4 oz (61 kg)  05/03/20 139 lb 6.4 oz (63.2 kg)    Psych/Mental status: Alert, oriented x 3 (person, place, & time)       Eyes: PERLA Respiratory: No evidence of acute respiratory distress  Assessment & Plan  Primary Diagnosis & Pertinent Problem List: The primary encounter diagnosis was Chronic pain syndrome. Diagnoses of Chronic thoracic back pain (1ry area of Pain) (Bilateral) (L>R), Myofascial pain syndrome of thoracic spine, Thoracic facet syndrome (Bilateral), Chronic upper back pain (Bilateral), Spondylosis without myelopathy or radiculopathy, thoracic region, Anxiety due to invasive procedure, Pharmacologic therapy, Chronic use of opiate for therapeutic purpose, Encounter for chronic pain management, and Encounter for medication management were also pertinent to this visit.  Visit Diagnosis: 1. Chronic pain syndrome   2. Chronic thoracic back pain (1ry area of Pain) (Bilateral) (L>R)   3. Myofascial pain syndrome of thoracic spine   4. Thoracic facet syndrome (Bilateral)   5. Chronic upper back pain (Bilateral)   6. Spondylosis without myelopathy or radiculopathy, thoracic region   7. Anxiety due to invasive procedure   8. Pharmacologic therapy   9. Chronic use of opiate for therapeutic purpose   10. Encounter for chronic pain management   11. Encounter for medication management    Problems updated and reviewed during this visit: Problem  Thoracic facet syndrome (Bilateral)  Chronic upper back pain (Bilateral)  Spondylosis Without Myelopathy Or Radiculopathy,  Thoracic Region    Plan of Care  Pharmacotherapy (Medications Ordered): Meds ordered this encounter  Medications   diazepam (VALIUM) 5 MG tablet    Sig: Take one (1) tab (5 mg) 60 minutes before scheduled procedure. Wait 30 minutes. If still anxious, take the second (5 mg) tab 30 min before procedure.    Dispense:  2 tablet    Refill:  0    Must have a driver. Do not drive or operate machinery x 24 hours after taking this medication. Avoid taking within 4 hours of having taken an opioid pain medications.   traMADol (ULTRAM) 50 MG tablet    Sig: Take 1 tablet (50 mg total) by mouth every 6 (six) hours as needed for severe pain. Must last 30 days    Dispense:  120 tablet    Refill:  0    Not a duplicate. Do NOT delete! Dispense 1 day early if closed on fill date. Warn not to take CNS-depressants 8 hours before or after taking opioid. Do  not send refill request. Renewal requires appointment.    Procedure Orders         THORACIC FACET BLOCK     Lab Orders  No laboratory test(s) ordered today   Imaging Orders  No imaging studies ordered today   Referral Orders  No referral(s) requested today    Pharmacological management options:  Opioid Analgesics: We'll take over management today. See above orders Membrane stabilizer: Options discussed, including a trial. Muscle relaxant: We have discussed the possibility of a trial NSAID: Trial discussed. Other analgesic(s): To be determined at a later time      Interventional Therapies  Risk  Complexity Considerations:   Estimated body mass index is 24.51 kg/m as calculated from the following:   Height as of this encounter: $RemoveBeforeD'5\' 2"'jXpVSOvBXtWwVW$  (1.575 m).   Weight as of this encounter: 134 lb (60.8 kg). WNL   Planned  Pending:   Diagnostic bilateral thoracic facet MBB #1    Under consideration:   Diagnostic/therapeutic trigger point injections  Diagnostic bilateral thoracic facet block    Completed:   None at this time   Therapeutic   Palliative (PRN) options:   None established    Provider-requested follow-up: Return for Proc-day (T,Th), (Clinic) procedure: (B) vs (L) T-FCT BLK #1. Recent Visits Date Type Provider Dept  12/06/20 Office Visit Milinda Pointer, MD [facility]  Showing recent visits within past 90 days and meeting all other requirements Today's Visits Date Type Provider Dept  02/07/21 Office Visit Milinda Pointer, MD [facility]  Showing today's visits and meeting all other requirements Future Appointments No visits were found meeting these conditions. Showing future appointments within next 90 days and meeting all other requirements Primary Care Physician: Eulogio Bear, NP Note by: Gaspar Cola, MD Date: 02/07/2021; Time: 2:05 PM

## 2021-02-07 ENCOUNTER — Ambulatory Visit: Payer: No Typology Code available for payment source | Attending: Pain Medicine | Admitting: Pain Medicine

## 2021-02-07 ENCOUNTER — Encounter: Payer: Self-pay | Admitting: Pain Medicine

## 2021-02-07 ENCOUNTER — Other Ambulatory Visit: Payer: Self-pay

## 2021-02-07 ENCOUNTER — Other Ambulatory Visit (HOSPITAL_COMMUNITY): Payer: Self-pay

## 2021-02-07 VITALS — BP 124/76 | HR 99 | Temp 97.3°F | Ht 62.0 in | Wt 134.0 lb

## 2021-02-07 DIAGNOSIS — G894 Chronic pain syndrome: Secondary | ICD-10-CM | POA: Diagnosis not present

## 2021-02-07 DIAGNOSIS — M47894 Other spondylosis, thoracic region: Secondary | ICD-10-CM

## 2021-02-07 DIAGNOSIS — Z79891 Long term (current) use of opiate analgesic: Secondary | ICD-10-CM | POA: Diagnosis present

## 2021-02-07 DIAGNOSIS — M7918 Myalgia, other site: Secondary | ICD-10-CM | POA: Diagnosis not present

## 2021-02-07 DIAGNOSIS — M546 Pain in thoracic spine: Secondary | ICD-10-CM | POA: Diagnosis not present

## 2021-02-07 DIAGNOSIS — F419 Anxiety disorder, unspecified: Secondary | ICD-10-CM | POA: Insufficient documentation

## 2021-02-07 DIAGNOSIS — Z79899 Other long term (current) drug therapy: Secondary | ICD-10-CM | POA: Diagnosis present

## 2021-02-07 DIAGNOSIS — G8929 Other chronic pain: Secondary | ICD-10-CM | POA: Insufficient documentation

## 2021-02-07 DIAGNOSIS — M47814 Spondylosis without myelopathy or radiculopathy, thoracic region: Secondary | ICD-10-CM | POA: Insufficient documentation

## 2021-02-07 DIAGNOSIS — M549 Dorsalgia, unspecified: Secondary | ICD-10-CM | POA: Diagnosis present

## 2021-02-07 HISTORY — DX: Other spondylosis, thoracic region: M47.894

## 2021-02-07 HISTORY — DX: Other chronic pain: G89.29

## 2021-02-07 MED ORDER — DIAZEPAM 5 MG PO TABS
5.0000 mg | ORAL_TABLET | ORAL | 0 refills | Status: DC
  Filled 2021-02-07: qty 2, 1d supply, fill #0

## 2021-02-07 MED ORDER — TRAMADOL HCL 50 MG PO TABS
50.0000 mg | ORAL_TABLET | Freq: Four times a day (QID) | ORAL | 0 refills | Status: DC | PRN
  Filled 2021-02-07: qty 120, 30d supply, fill #0

## 2021-02-07 NOTE — Patient Instructions (Addendum)
______________________________________________________________________  Preparing for Procedure with Oral Anxiolytics  Definition: Anxiolytics - Medications that provide muscle relaxation and decrease anxiety.  Procedure appointments are limited to planned procedures: No Prescription Refills. No disability issues will be discussed. No medication changes will be discussed.  Instructions: Oral Intake: Do not eat or drink anything for at least 6 hours prior to your procedure. (Exception: Blood Pressure Medication. See below.)  Anxiolytic Medication: This medication is meant to relax you during your procedure. DO NOT DRIVE OR OPERATE DANGEROUS MACHINERY WHILE UNDER THE INFLUENCE OF THIS MEDICATION. Take the oral medication (i.e.: Valium) as prescribed on the day of your procedure with just a sip of water. Prescription will be sent to your pharmacy, prior to the day of your procedure. Topical anesthetic: Your physician might have recommended a topical anesthetic/analgesic to apply over the area where the procedure will be done. If so, apply to area 1 hour prior to procedure. For exact location of application, ask your healthcare provider.    Transportation: A driver is required. You may not drive yourself after the procedure. Blood Pressure Medicine: Do not forget to take your blood pressure medicine with a sip of water the morning of the procedure. If your Diastolic (lower reading) is above 100 mmHg, elective cases will be cancelled/rescheduled. Blood thinners: These will need to be stopped for procedures. Notify our staff if you are taking any blood thinners. Depending on which one you take, there will be specific instructions on how and when to stop it. Diabetics on insulin: Notify the staff so that you can be scheduled 1st case in the morning. If your diabetes requires high dose insulin, take only  of your normal insulin dose the morning of the procedure and notify the staff that you have done  so. Preventing infections: Shower with an antibacterial soap the morning of your procedure. Build-up your immune system: Take 1000 mg of Vitamin C with every meal (3 times a day) the day prior to your procedure. Antibiotics: Inform the staff if you have a condition or reason that requires you to take antibiotics before dental procedures. Pregnancy: If you are pregnant, call and cancel the procedure. Sickness: If you have a cold, fever, or any active infections, call and cancel the procedure. Arrival: You must be in the facility at least 30 minutes prior to your scheduled procedure. Children: Do not bring children with you. Dress appropriately: Bring dark clothing that you would not mind if they get stained. Valuables: Do not bring any jewelry or valuables.  Reasons to call and reschedule or cancel your procedure:  NOTE: Following these recommendations will minimize the risk of a serious complication. Surgeries: Avoid having procedures within 2 weeks of any surgery. (Avoid for 2 weeks before or after any surgery). Flu Shots: Avoid having procedures within 2 weeks of a flu shots. (Avoid for 2 weeks before or after immunizations). Barium: Avoid having a procedure within 7-10 days after having had a radiological study involving the use of radiological contrast. (Myelograms, Barium swallow or enema study). Heart attacks: Avoid any elective procedures or surgeries for the initial 6 months after a "Myocardial Infarction" (Heart Attack). Blood thinners: It is imperative that you stop these medications before procedures. Let us know if you if you take any blood thinner.  Infection: Avoid procedures during or within two weeks of an infection (including chest colds or gastrointestinal problems). Symptoms associated with infections include: Localized redness, fever, chills, night sweats or profuse sweating, burning sensation when voiding, cough, congestion,   stuffiness, runny nose, sore throat, diarrhea,  nausea, vomiting, cold or Flu symptoms, recent or current infections. It is especially important if the infection is over the area that we intend to treat. Heart and lung problems: Symptoms that may suggest an active cardiopulmonary problem include: cough, chest pain, breathing difficulties or shortness of breath, dizziness, ankle swelling, uncontrolled high or unusually low blood pressure, and/or palpitations. If you are experiencing any of these symptoms, cancel your procedure and contact your primary care physician for an evaluation.  Remember:  Regular Business hours are:  Monday to Thursday 8:00 AM to 4:00 PM  Provider's Schedule: Milinda Pointer, MD:  Procedure days: Tuesday and Thursday 7:30 AM to 4:00 PM  Gillis Santa, MD:  Procedure days: Monday and Wednesday 7:30 AM to 4:00 PM ______________________________________________________________________  ____________________________________________________________________________________________  General Risks and Possible Complications  Patient Responsibilities: It is important that you read this as it is part of your informed consent. It is our duty to inform you of the risks and possible complications associated with treatments offered to you. It is your responsibility as a patient to read this and to ask questions about anything that is not clear or that you believe was not covered in this document.  Patient's Rights: You have the right to refuse treatment. You also have the right to change your mind, even after initially having agreed to have the treatment done. However, under this last option, if you wait until the last second to change your mind, you may be charged for the materials used up to that point.  Introduction: Medicine is not an Chief Strategy Officer. Everything in Medicine, including the lack of treatment(s), carries the potential for danger, harm, or loss (which is by definition: Risk). In Medicine, a complication is a secondary  problem, condition, or disease that can aggravate an already existing one. All treatments carry the risk of possible complications. The fact that a side effects or complications occurs, does not imply that the treatment was conducted incorrectly. It must be clearly understood that these can happen even when everything is done following the highest safety standards.  No treatment: You can choose not to proceed with the proposed treatment alternative. The "PRO(s)" would include: avoiding the risk of complications associated with the therapy. The "CON(s)" would include: not getting any of the treatment benefits. These benefits fall under one of three categories: diagnostic; therapeutic; and/or palliative. Diagnostic benefits include: getting information which can ultimately lead to improvement of the disease or symptom(s). Therapeutic benefits are those associated with the successful treatment of the disease. Finally, palliative benefits are those related to the decrease of the primary symptoms, without necessarily curing the condition (example: decreasing the pain from a flare-up of a chronic condition, such as incurable terminal cancer).  General Risks and Complications: These are associated to most interventional treatments. They can occur alone, or in combination. They fall under one of the following six (6) categories: no benefit or worsening of symptoms; bleeding; infection; nerve damage; allergic reactions; and/or death. No benefits or worsening of symptoms: In Medicine there are no guarantees, only probabilities. No healthcare provider can ever guarantee that a medical treatment will work, they can only state the probability that it may. Furthermore, there is always the possibility that the condition may worsen, either directly, or indirectly, as a consequence of the treatment. Bleeding: This is more common if the patient is taking a blood thinner, either prescription or over the counter (example: Goody  Powders, Fish oil, Aspirin, Garlic, etc.),  or if suffering a condition associated with impaired coagulation (example: Hemophilia, cirrhosis of the liver, low platelet counts, etc.). However, even if you do not have one on these, it can still happen. If you have any of these conditions, or take one of these drugs, make sure to notify your treating physician. Infection: This is more common in patients with a compromised immune system, either due to disease (example: diabetes, cancer, human immunodeficiency virus [HIV], etc.), or due to medications or treatments (example: therapies used to treat cancer and rheumatological diseases). However, even if you do not have one on these, it can still happen. If you have any of these conditions, or take one of these drugs, make sure to notify your treating physician. Nerve Damage: This is more common when the treatment is an invasive one, but it can also happen with the use of medications, such as those used in the treatment of cancer. The damage can occur to small secondary nerves, or to large primary ones, such as those in the spinal cord and brain. This damage may be temporary or permanent and it may lead to impairments that can range from temporary numbness to permanent paralysis and/or brain death. Allergic Reactions: Any time a substance or material comes in contact with our body, there is the possibility of an allergic reaction. These can range from a mild skin rash (contact dermatitis) to a severe systemic reaction (anaphylactic reaction), which can result in death. Death: In general, any medical intervention can result in death, most of the time due to an unforeseen complication. ____________________________________________________________________________________________ ____________________________________________________________________________________________  Medication Rules  Purpose: To inform patients, and their family members, of our rules and  regulations.  Applies to: All patients receiving prescriptions (written or electronic).  Pharmacy of record: Pharmacy where electronic prescriptions will be sent. If written prescriptions are taken to a different pharmacy, please inform the nursing staff. The pharmacy listed in the electronic medical record should be the one where you would like electronic prescriptions to be sent.  Electronic prescriptions: In compliance with the [place] (STOP) Act of 2017 (Session Lanny Cramp [phone]), effective May 22, 2018, all controlled substances must be electronically prescribed. Calling prescriptions to the pharmacy will cease to exist.  Prescription refills: Only during scheduled appointments. Applies to all prescriptions.  NOTE: The following applies primarily to controlled substances (Opioid* Pain Medications).   Type of encounter (visit): For patients receiving controlled substances, face-to-face visits are required. (Not an option or up to the patient.)  Patient's responsibilities: Pain Pills: Bring all pain pills to every appointment (except for procedure appointments). Pill Bottles: Bring pills in original pharmacy bottle. Always bring the newest bottle. Bring bottle, even if empty. Medication refills: You are responsible for knowing and keeping track of what medications you take and those you need refilled. The day before your appointment: write a list of all prescriptions that need to be refilled. The day of the appointment: give the list to the admitting nurse. Prescriptions will be written only during appointments. No prescriptions will be written on procedure days. If you forget a medication: it will not be "Called in", "Faxed", or "electronically sent". You will need to get another appointment to get these prescribed. No early refills. Do not call asking to have your prescription filled early. Prescription Accuracy: You are responsible for  carefully inspecting your prescriptions before leaving our office. Have the discharge nurse carefully go over each prescription with you, before taking them home. Make sure that your  name is accurately spelled, that your address is correct. Check the name and dose of your medication to make sure it is accurate. Check the number of pills, and the written instructions to make sure they are clear and accurate. Make sure that you are given enough medication to last until your next medication refill appointment. Taking Medication: Take medication as prescribed. When it comes to controlled substances, taking less pills or less frequently than prescribed is permitted and encouraged. Never take more pills than instructed. Never take medication more frequently than prescribed.  Inform other Doctors: Always inform, all of your healthcare providers, of all the medications you take. Pain Medication from other Providers: You are not allowed to accept any additional pain medication from any other Doctor or Healthcare provider. There are two exceptions to this rule. (see below) In the event that you require additional pain medication, you are responsible for notifying us, as stated below. Cough Medicine: Often these contain an opioid, such as codeine or hydrocodone. Never accept or take cough medicine containing these opioids if you are already taking an opioid* medication. The combination may cause respiratory failure and death. Medication Agreement: You are responsible for carefully reading and following our Medication Agreement. This must be signed before receiving any prescriptions from our practice. Safely store a copy of your signed Agreement. Violations to the Agreement will result in no further prescriptions. (Additional copies of our Medication Agreement are available upon request.) Laws, Rules, & Regulations: All patients are expected to follow all Federal and [employer], [employer], Rules, [employer]. Ignorance  of the Laws does not constitute a valid excuse.  Illegal drugs and Controlled Substances: The use of illegal substances (including, but not limited to marijuana and its derivatives) and/or the illegal use of any controlled substances is strictly prohibited. Violation of this rule may result in the immediate and permanent discontinuation of any and all prescriptions being written by our practice. The use of any illegal substances is prohibited. Adopted CDC guidelines & recommendations: Target dosing levels will be at or below 60 MME/day. Use of benzodiazepines** is not recommended.  Exceptions: There are only two exceptions to the rule of not receiving pain medications from other Healthcare Providers. Exception #1 (Emergencies): In the event of an emergency (i.e.: accident requiring emergency care), you are allowed to receive additional pain medication. However, you are responsible for: As soon as you are able, call our office [phone], at any time of the day or night, and leave a message stating your name, the date and nature of the emergency, and the name and dose of the medication prescribed. In the event that your call is answered by a member of our staff, make sure to document and save the date, time, and the name of the person that took your information.  Exception #2 (Planned Surgery): In the event that you are scheduled by another doctor or dentist to have any type of surgery or procedure, you are allowed (for a period no longer than 30 days), to receive additional pain medication, for the acute post-op pain. However, in this case, you are responsible for picking up a copy of our "Post-op Pain Management for Surgeons" handout, and giving it to your surgeon or dentist. This document is available at our office, and does not require an appointment to obtain it. Simply go to our office during business hours (Monday-Thursday from 8:00 AM to 4:00 PM) (Friday 8:00 AM to 12:00 Noon) or if you have a  scheduled appointment  with [place], prior to your surgery, and ask for it by name. In addition, you are responsible for: calling our office [phone], at any time of the day or night, and leaving a message stating your name, name of your surgeon, type of surgery, and date of procedure or surgery. Failure to comply with your responsibilities may result in termination of therapy involving the controlled substances.  *Opioid medications include: morphine, codeine, oxycodone, oxymorphone, hydrocodone, hydromorphone, meperidine, tramadol, tapentadol, buprenorphine, fentanyl, methadone. **Benzodiazepine medications include: diazepam (Valium), alprazolam (Xanax), clonazepam (Klonopine), lorazepam (Ativan), clorazepate (Tranxene), chlordiazepoxide (Librium), estazolam (Prosom), oxazepam (Serax), temazepam (Restoril), triazolam (Halcion) (Last updated: 04/19/2020) ____________________________________________________________________________________________  ____________________________________________________________________________________________  Medication Recommendations and Reminders  Applies to: All patients receiving prescriptions (written and/or electronic).  Medication Rules & Regulations: These rules and regulations exist for your safety and that of others. They are not flexible and neither are we. Dismissing or ignoring them will be considered "non-compliance" with medication therapy, resulting in complete and irreversible termination of such therapy. (See document titled "Medication Rules" for more details.) In all conscience, because of safety reasons, we cannot continue providing a therapy where the patient does not follow instructions.  Pharmacy of record:  Definition: This is the pharmacy where your electronic prescriptions will be sent.  We do not endorse any particular pharmacy, however, we have experienced problems with [facility] not securing enough medication supply for the community. We  do not restrict you in your choice of pharmacy. However, once we write for your prescriptions, we will NOT be re-sending more prescriptions to fix restricted supply problems created by your pharmacy, or your insurance.  The pharmacy listed in the electronic medical record should be the one where you want electronic prescriptions to be sent. If you choose to change pharmacy, simply notify our nursing staff.  Recommendations: Keep all of your pain medications in a safe place, under lock and key, even if you live alone. We will NOT replace lost, stolen, or damaged medication. After you fill your prescription, take 1 week's worth of pills and put them away in a safe place. You should keep a separate, properly labeled bottle for this purpose. The remainder should be kept in the original bottle. Use this as your primary supply, until it runs out. Once it's gone, then you know that you have 1 week's worth of medicine, and it is time to come in for a prescription refill. If you do this correctly, it is unlikely that you will ever run out of medicine. To make sure that the above recommendation works, it is very important that you make sure your medication refill appointments are scheduled at least 1 week before you run out of medicine. To do this in an effective manner, make sure that you do not leave the office without scheduling your next medication management appointment. Always ask the nursing staff to show you in your prescription , when your medication will be running out. Then arrange for the receptionist to get you a return appointment, at least 7 days before you run out of medicine. Do not wait until you have 1 or 2 pills left, to come in. This is very poor planning and does not take into consideration that we may need to cancel appointments due to bad weather, sickness, or emergencies affecting our staff. DO NOT ACCEPT A "Partial Fill": If for any reason your pharmacy does not have enough pills/tablets to  completely fill or refill your prescription, do not allow for a "partial fill". The law  allows the pharmacy to complete that prescription within 72 hours, without requiring a new prescription. If they do not fill the rest of your prescription within those 72 hours, you will need a separate prescription to fill the remaining amount, which we will NOT provide. If the reason for the partial fill is your insurance, you will need to talk to the pharmacist about payment alternatives for the remaining tablets, but again, DO NOT ACCEPT A PARTIAL FILL, unless you can trust your pharmacist to obtain the remainder of the pills within 72 hours.  Prescription refills and/or changes in medication(s):  Prescription refills, and/or changes in dose or medication, will be conducted only during scheduled medication management appointments. (Applies to both, written and electronic prescriptions.) No refills on procedure days. No medication will be changed or started on procedure days. No changes, adjustments, and/or refills will be conducted on a procedure day. Doing so will interfere with the diagnostic portion of the procedure. No phone refills. No medications will be "called into the pharmacy". No Fax refills. No weekend refills. No Holliday refills. No after hours refills.  Remember:  Business hours are:  Monday to Thursday 8:00 AM to 4:00 PM Provider's Schedule: Milinda Pointer, MD - Appointments are:  Medication management: Monday and Wednesday 8:00 AM to 4:00 PM Procedure day: Tuesday and Thursday 7:30 AM to 4:00 PM Gillis Santa, MD - Appointments are:  Medication management: Tuesday and Thursday 8:00 AM to 4:00 PM Procedure day: Monday and Wednesday 7:30 AM to 4:00 PM (Last update: 12/10/2019) ____________________________________________________________________________________________  ____________________________________________________________________________________________  CBD (cannabidiol)  WARNING  Applicable to: All individuals currently taking or considering taking CBD (cannabidiol) and, more important, all patients taking opioid analgesic controlled substances (pain medication). (Example: oxycodone; oxymorphone; hydrocodone; hydromorphone; morphine; methadone; tramadol; tapentadol; fentanyl; buprenorphine; butorphanol; dextromethorphan; meperidine; codeine; etc.)  Legal status: CBD remains a Schedule I drug prohibited for any use. CBD is illegal with one exception. In the [place], CBD has a limited Transport planner (FDA) approval for the treatment of two specific types of epilepsy disorders. Only one CBD product has been approved by the FDA for this purpose: "Epidiolex". FDA is aware that some companies are marketing products containing cannabis and cannabis-derived compounds in ways that violate the [employer], Drug and Cosmetic Act [facility] Act) and that may put the health and safety of consumers at risk. The FDA, a Federal agency, has not enforced the CBD status since 2018.   Legality: Some manufacturers ship CBD products nationally, which is illegal. Often such products are sold online and are therefore available throughout the country. CBD is openly sold in head shops and health food stores in some states where such sales have not been explicitly legalized. Selling unapproved products with unsubstantiated therapeutic claims is not only a violation of the law, but also can put patients at risk, as these products have not been proven to be safe or effective. Federal illegality makes it difficult to conduct research on CBD.  Reference: "FDA Regulation of Cannabis and Cannabis-Derived Products, Including Cannabidiol (CBD)" - [employer]  Warning: CBD is not FDA approved and has not undergo the same manufacturing controls as prescription drugs.  This  means that the purity and safety of available CBD may be questionable. Most of the time, despite manufacturer's claims, it is contaminated with THC (delta-9-tetrahydrocannabinol - the chemical in marijuana responsible for the "HIGH").  When this is the case, the [facility] contaminant will trigger a positive urine drug screen (UDS) test for Marijuana (carboxy-THC).  Because a positive UDS for any illicit substance is a violation of our medication agreement, your opioid analgesics (pain medicine) may be permanently discontinued.  MORE ABOUT CBD  General Information: CBD  is a derivative of the Marijuana (cannabis sativa) plant discovered in 24. It is one of the 113 identified substances found in Marijuana. It accounts for up to 40% of the plant's extract. As of 2018, preliminary clinical studies on CBD included research for the treatment of anxiety, movement disorders, and pain. CBD is available and consumed in multiple forms, including inhalation of smoke or vapor, as an aerosol spray, and by mouth. It may be supplied as an oil containing CBD, capsules, dried cannabis, or as a liquid solution. CBD is thought not to be as psychoactive as THC (delta-9-tetrahydrocannabinol - the chemical in marijuana responsible for the "HIGH"). Studies suggest that CBD may interact with different biological target receptors in the body, including cannabinoid and other neurotransmitter receptors. As of 2018 the mechanism of action for its biological effects has not been determined.  Side-effects  Adverse reactions: Dry mouth, diarrhea, decreased appetite, fatigue, drowsiness, malaise, weakness, sleep disturbances, and others.  Drug interactions: CBC may interact with other medications such as blood-thinners. (Last update: 12/27/2019) ____________________________________________________________________________________________  ____________________________________________________________________________________________  Drug  Holidays (Slow)  What is a "Drug Holiday"? Drug Holiday: is the name given to the period of time during which a patient stops taking a medication(s) for the purpose of eliminating tolerance to the drug.  Benefits Improved effectiveness of opioids. Decreased opioid dose needed to achieve benefits. Improved pain with lesser dose.  What is tolerance? Tolerance: is the progressive decreased in effectiveness of a drug due to its repetitive use. With repetitive use, the body gets use to the medication and as a consequence, it loses its effectiveness. This is a common problem seen with opioid pain medications. As a result, a larger dose of the drug is needed to achieve the same effect that used to be obtained with a smaller dose.  How long should a "Drug Holiday" last? You should stay off of the pain medicine for at least 14 consecutive days. (2 weeks)  Should I stop the medicine "cold [place]"? No. You should always coordinate with your Pain Specialist so that he/she can provide you with the correct medication dose to make the transition as smoothly as possible.  How do I stop the medicine? Slowly. You will be instructed to decrease the daily amount of pills that you take by one (1) pill every seven (7) days. This is called a "slow downward taper" of your dose. For example: if you normally take four (4) pills per day, you will be asked to drop this dose to three (3) pills per day for seven (7) days, then to two (2) pills per day for seven (7) days, then to one (1) per day for seven (7) days, and at the end of those last seven (7) days, this is when the "Drug Holiday" would start.   Will I have withdrawals? By doing a "slow downward taper" like this one, it is unlikely that you will experience any significant withdrawal symptoms. Typically, what triggers withdrawals is the sudden stop of a high dose opioid therapy. Withdrawals can usually be avoided by slowly decreasing the dose over a prolonged  period of time. If you do not follow these instructions and decide to stop your medication abruptly, withdrawals may be possible.  What are withdrawals? Withdrawals: refers to the wide range of symptoms that occur  after stopping or dramatically reducing opiate drugs after heavy and prolonged use. Withdrawal symptoms do not occur to patients that use low dose opioids, or those who take the medication sporadically. Contrary to benzodiazepine (example: Valium, Xanax, etc.) or alcohol withdrawals ("Delirium Tremens"), opioid withdrawals are not lethal. Withdrawals are the physical manifestation of the body getting rid of the excess receptors.  Expected Symptoms Early symptoms of withdrawal may include: Agitation Anxiety Muscle aches Increased tearing Insomnia Runny nose Sweating Yawning  Late symptoms of withdrawal may include: Abdominal cramping Diarrhea Dilated pupils Goose bumps Nausea Vomiting  Will I experience withdrawals? Due to the slow nature of the taper, it is very unlikely that you will experience any.  What is a slow taper? Taper: refers to the gradual decrease in dose.  (Last update: 12/10/2019) ____________________________________________________________________________________________   ____________________________________________________________________________________________  Pain Prevention Technique  Definition:   A technique used to minimize the effects of an activity known to cause inflammation or swelling, which in turn leads to an increase in pain.  Purpose: To prevent swelling from occurring. It is based on the fact that it is easier to prevent swelling from happening than it is to get rid of it, once it occurs.  Contraindications: Anyone with allergy or hypersensitivity to the recommended medications. Anyone taking anticoagulants (Blood Thinners) (e.g., Coumadin, Warfarin, Plavix, etc.). Patients in Renal Failure.  Technique: Before you undertake  an activity known to cause pain, or a flare-up of your chronic pain, and before you experience any pain, do the following:  On a full stomach, take 4 (four) over the counter Ibuprofens '200mg'$  tablets (Motrin), for a total of 800 mg. In addition, take over the counter Magnesium 400 to 500 mg, before doing the activity.  Six (6) hours later, again on a full stomach, repeat the Ibuprofen. That night, take a warm shower and stretch under the running warm water.  This technique may be sufficient to abort the pain and discomfort before it happens. Keep in mind that it takes a lot less medication to prevent swelling than it takes to eliminate it once it occurs.  ____________________________________________________________________________________________  Preparing for Procedure with Sedation Instructions: Oral Intake: Do not eat or drink anything for at least 8 hours prior to your procedure. Transportation: Public transportation is not allowed. Bring an adult driver. The driver must be physically present in our waiting room before any procedure can be started. Physical Assistance: Bring an adult capable of physically assisting you, in the event you need help. Blood Pressure Medicine: Take your blood pressure medicine with a sip of water the morning of the procedure. Insulin: Take only  of your normal insulin dose. Preventing infections: Shower with an antibacterial soap the morning of your procedure. Build-up your immune system: Take 1000 mg of Vitamin C with every meal (3 times a day) the day prior to your procedure. Pregnancy: If you are pregnant, call and cancel the procedure. Sickness: If you have a cold, fever, or any active infections, call and cancel the procedure. Arrival: You must be in the facility at least 30 minutes prior to your scheduled procedure. Children: Do not bring children with you. Dress appropriately: Bring dark clothing that you would not mind if they get stained. Valuables:  Do not bring any jewelry or valuables. Procedure appointments are reserved for interventional treatments only. No Prescription Refills. No medication changes will be discussed during procedure appointments. No disability issues will be discussed. Facet Blocks Patient Information  Description: The facets are joints in the  spine between the vertebrae.  Like any joints in the body, facets can become irritated and painful.  Arthritis can also effect the facets.  By injecting steroids and local anesthetic in and around these joints, we can temporarily block the nerve supply to them.  Steroids act directly on irritated nerves and tissues to reduce selling and inflammation which often leads to decreased pain.  Facet blocks may be done anywhere along the spine from the neck to the low back depending upon the location of your pain.   After numbing the skin with local anesthetic (like Novocaine), a small needle is passed onto the facet joints under x-ray guidance.  You may experience a sensation of pressure while this is being done.  The entire block usually lasts about 15-25 minutes.   Conditions which may be treated by facet blocks:  Low back/buttock pain Neck/shoulder pain Certain types of headaches  Preparation for the injection:  Do not eat any solid food or dairy products within 8 hours of your appointment. You may drink clear liquid up to 3 hours before appointment.  Clear liquids include water, black coffee, juice or soda.  No milk or cream please. You may take your regular medication, including pain medications, with a sip of water before your appointment.  Diabetics should hold regular insulin (if taken separately) and take 1/2 normal NPH dose the morning of the procedure.  Carry some sugar containing items with you to your appointment. A driver must accompany you and be prepared to drive you home after your procedure. Bring all your current medications with you. An IV may be inserted and  sedation may be given at the discretion of the physician. A blood pressure cuff, EKG and other monitors will often be applied during the procedure.  Some patients may need to have extra oxygen administered for a short period. You will be asked to provide medical information, including your allergies and medications, prior to the procedure.  We must know immediately if you are taking blood thinners (like Coumadin/Warfarin) or if you are allergic to IV iodine contrast (dye).  We must know if you could possible be pregnant.  Possible side-effects:  Bleeding from needle site Infection (rare, may require surgery) Nerve injury (rare) Numbness & tingling (temporary) Difficulty urinating (rare, temporary) Spinal headache (a headache worse with upright posture) Light-headedness (temporary) Pain at injection site (serveral days) Decreased blood pressure (rare, temporary) Weakness in arm/leg (temporary) Pressure sensation in back/neck (temporary)   Call if you experience:  Fever/chills associated with headache or increased back/neck pain Headache worsened by an upright position New onset, weakness or numbness of an extremity below the injection site Hives or difficulty breathing (go to the emergency room) Inflammation or drainage at the injection site(s) Severe back/neck pain greater than usual New symptoms which are concerning to you  Please note:  Although the local anesthetic injected can often make your back or neck feel good for several hours after the injection, the pain will likely return. It takes 3-7 days for steroids to work.  You may not notice any pain relief for at least one week.  If effective, we will often do a series of 2-3 injections spaced 3-6 weeks apart to maximally decrease your pain.  After the initial series, you may be a candidate for a more permanent nerve block of the facets.  If you have any questions, please call #[phone]) [facility]

## 2021-02-07 NOTE — Progress Notes (Signed)
Nursing Pain Medication Assessment:  Safety precautions to be maintained throughout the outpatient stay will include: orient to surroundings, keep bed in low position, maintain call bell within reach at all times, provide assistance with transfer out of bed and ambulation.  Medication Inspection Compliance: Pill count conducted under aseptic conditions, in front of the patient. Neither the pills nor the bottle was removed from the patient's sight at any time. Once count was completed pills were immediately returned to the patient in their original bottle.  Medication: Tramadol (Ultram) Pill/Patch Count:  0 of 120 pills remain Pill/Patch Appearance: Markings consistent with prescribed medication Bottle Appearance: Standard pharmacy container. Clearly labeled. Filled Date: 8 / 17 / 2022 Last Medication intake:  Today Safety precautions to be maintained throughout the outpatient stay will include: orient to surroundings, keep bed in low position, maintain call bell within reach at all times, provide assistance with transfer out of bed and ambulation.

## 2021-02-15 ENCOUNTER — Telehealth: Payer: Self-pay

## 2021-02-15 NOTE — Telephone Encounter (Signed)
Insurance denied authorization for thoracic facet block. I will make a copy of the denial and put on your desk. Please discuss options with patient on her 10/17 visit.

## 2021-02-22 ENCOUNTER — Telehealth: Payer: Self-pay | Admitting: *Deleted

## 2021-02-22 NOTE — Telephone Encounter (Signed)
Received call from [facility]  with pain management program [phone]~ telephone.  Reports that patient is concerned about prescription for pain management.   States that NP is unwilling to refill Tramadol 50mg  PO QID #120 tabs. States that she was seen at pain management and they wanted to try injections. States that patient does not wish to proceed with invasive procedures.   Inquired if MD would take over prescription for Tramadol.

## 2021-02-23 NOTE — Telephone Encounter (Signed)
As far as I am aware, MD is not accepting new patients.  It would not be appropriate for him to write the pain medication prescription.  We can refer her for a second opinion to an alternate pain management clinic, or she can choose to establish with a new PCP.

## 2021-02-23 NOTE — Telephone Encounter (Signed)
Call placed to  [facility]  with pain management program [phone]~ telephone.  Advised of PCP recommendations. States that she will assist patient to either find new pain management provider or PCP.

## 2021-02-24 NOTE — Telephone Encounter (Signed)
Noted  

## 2021-03-02 ENCOUNTER — Other Ambulatory Visit (HOSPITAL_COMMUNITY): Payer: Self-pay

## 2021-03-06 NOTE — Progress Notes (Signed)
PROVIDER NOTE: Information contained herein reflects review and annotations entered in association with encounter. Interpretation of such information and data should be left to medically-trained personnel. Information provided to patient can be located elsewhere in the medical record under "Patient Instructions". Document created using STT-dictation technology, any transcriptional errors that may result from process are unintentional.    Patient: Karen Schwartz  Service Category: E/M  Provider: Gaspar Cola, MD  DOB: [birth year]  DOS: 03/07/2021  Specialty: Interventional Pain Management  MRN: [id number]  Setting: Ambulatory outpatient  PCP: Karen Bear, NP  Type: Established Patient    Referring Provider: Eulogio Bear, NP  Location: Office  Delivery: Face-to-face     HPI  [name], a 50 y.o. year old female, is here today because of her Chronic bilateral thoracic back pain [M54.6, G89.29]. [name]'s primary complain today is Back Pain (Upper left) Last encounter: My last encounter with her was on 02/07/2021. Pertinent problems: [name] has Fibromyalgia; Raynauds phenomenon; Migraines; Myalgia and myositis; Chronic pain syndrome; Chronic thoracic back pain (1ry area of Pain) (Bilateral) (L>R); Myofascial pain syndrome of thoracic spine; Thoracic facet syndrome (Bilateral); Chronic upper back pain (Bilateral); and Spondylosis without myelopathy or radiculopathy, thoracic region on their pertinent problem list. Pain Assessment: Severity of Chronic pain is reported as a 0-No pain/10. Location: Scapula Left/denies (hot poker like). Onset: More than a month ago. Quality: Nagging, Sharp, Burning. Timing: Intermittent. Modifying factor(s): taking off bra, ice, heat, medications. Vitals:  height is $RemoveB'5\' 2"'LhVwIoHE$  (1.575 m) and weight is 134 lb (60.8 kg). Her temporal temperature is 97.3 F (36.3 C) (abnormal). Her blood pressure is 123/74 and her pulse is 87. Her respiration is 18  and oxygen saturation is 100%.   Reason for encounter: medication management.   The patient indicates doing well with the current medication regimen. No adverse reactions or side effects reported to the medications.   The patient's insurance denied the diagnostic thoracic facet block.  Today I have offered to order a CT scan of the thoracic spine and order physical therapy.  She indicates that she is 383% certain that physical therapy will aggravate her condition and then she will be able to continue working.  She has declined the offer for the referral.  Today we had a very long conversation regarding their future plans and he came up during our conversation that she is not too crazy about having the interventional therapies.  I have reminded her that I currently do not have the manpower to add patients for medication management only.  Today we will go ahead and give her a refill on her medication and she indicated that she will be looking for a PCP that would not mind writing for her tramadol.  Pharmacotherapy Assessment  Analgesic: Tramadol 50 mg, 2 tabs p.o. twice daily (200 mg/day of tramadol) (20 MME)  MME/day: 20 mg/day   Monitoring: [place] PMP: PDMP reviewed during this encounter.       Pharmacotherapy: No side-effects or adverse reactions reported. Compliance: No problems identified. Effectiveness: Clinically acceptable.  Hart Rochester, RN  03/07/2021  3:20 PM  Signed Nursing Pain Medication Assessment:  Safety precautions to be maintained throughout the outpatient stay will include: orient to surroundings, keep bed in low position, maintain call bell within reach at all times, provide assistance with transfer out of bed and ambulation.  Medication Inspection Compliance: Pill count conducted under aseptic conditions, in front of the patient. Neither the pills nor the  bottle was removed from the patient's sight at any time. Once count was completed pills were immediately returned to the  patient in their original bottle.  Medication: Tramadol (Ultram) Pill/Patch Count:  6 of 120 pills remain Pill/Patch Appearance: Markings consistent with prescribed medication Bottle Appearance: Standard pharmacy container. Clearly labeled. Filled Date: 09 / 19 / 2022 Last Medication intake:  Today    UDS:  Summary  Date Value Ref Range Status  12/06/2020 Note  Final    Comment:    ==================================================================== Compliance Drug Analysis, Ur ==================================================================== Test                             Result       Flag       Units  Drug Present and Declared for Prescription Verification   Tramadol                       4360         EXPECTED   ng/mg creat   O-Desmethyltramadol            6535         EXPECTED   ng/mg creat   N-Desmethyltramadol            3486         EXPECTED   ng/mg creat    Source of tramadol is a prescription medication. O-desmethyltramadol    and N-desmethyltramadol are expected metabolites of tramadol.    Methocarbamol                  PRESENT      EXPECTED   Guaifenesin                    PRESENT      EXPECTED    Guaifenesin may be administered as an over-the-counter or    prescription drug; it may also be present as a breakdown product of    methocarbamol.    Amitriptyline                  PRESENT      EXPECTED   Nortriptyline                  PRESENT      EXPECTED    Nortriptyline is an expected metabolite of amitriptyline.    Naproxen                       PRESENT      EXPECTED   Metoprolol                     PRESENT      EXPECTED  Drug Absent but Declared for Prescription Verification   Hydroxyzine                    Not Detected UNEXPECTED ==================================================================== Test                      Result    Flag   Units      Ref Range   Creatinine              72               mg/dL       >=20 ==================================================================== Declared Medications:  The flagging and interpretation on this report are based  on the  following declared medications.  Unexpected results may arise from  inaccuracies in the declared medications.   **Note: The testing scope of this panel includes these medications:   Amitriptyline  Guaifenesin  Hydroxyzine  Methocarbamol  Metoprolol  Naproxen  Tramadol   **Note: The testing scope of this panel does not include the  following reported medications:   Polyethylene Glycol (MiraLAX)  Sumatriptan ==================================================================== For clinical consultation, please call [phone]. ====================================================================      ROS  Constitutional: Denies any fever or chills Gastrointestinal: No reported hemesis, hematochezia, vomiting, or acute GI distress Musculoskeletal: Denies any acute onset joint swelling, redness, loss of ROM, or weakness Neurological: No reported episodes of acute onset apraxia, aphasia, dysarthria, agnosia, amnesia, paralysis, loss of coordination, or loss of consciousness  Medication Review  SUMAtriptan, Sodium Fluoride, amitriptyline, diazepam, guaiFENesin, hydrOXYzine, melatonin, methocarbamol, metoprolol succinate, naproxen, permethrin, polyethylene glycol, and traMADol  History Review  Allergy: [name] is allergic to ampicillin, latex, lyrica [pregabalin], and zantac [ranitidine hcl]. Drug: [name]  reports no history of drug use. Alcohol:  reports no history of alcohol use. Tobacco:  reports that she quit smoking about 21 years ago. Her smoking use included cigarettes. She has a 12.00 pack-year smoking history. She has never used smokeless tobacco. Social: [name]  reports that she quit smoking about 21 years ago. Her smoking use included cigarettes. She has a 12.00 pack-year smoking history. She has  never used smokeless tobacco. She reports that she does not drink alcohol and does not use drugs. Medical:  has a past medical history of Chronic headaches, Constipation, Eczema, Fibromyalgia, Hypertension, and IBS (irritable bowel syndrome). Surgical: [name]  has no past surgical history on file. Family: family history includes Alcohol abuse in her brother; Atrial fibrillation in her father; Cancer in her father; Dementia in her maternal grandfather; Depression in her mother and sister; Diabetes in her maternal grandmother; Hypertension in her mother and sister; Macular degeneration in her maternal grandmother; Mental illness in her brother; Stroke in her mother.  Laboratory Chemistry Profile   Renal Lab Results  Component Value Date   BUN 17 12/06/2020   CREATININE 0.78 12/06/2020   BCR 22 12/06/2020   GFRAA 92 03/22/2018   GFRNONAA 79 03/22/2018    Hepatic Lab Results  Component Value Date   AST 20 12/06/2020   ALT 10 04/27/2020   ALBUMIN 4.4 12/06/2020   ALKPHOS 52 12/06/2020    Electrolytes Lab Results  Component Value Date   NA 138 12/06/2020   K 4.4 12/06/2020   CL 101 12/06/2020   CALCIUM 9.1 12/06/2020   MG 2.2 12/06/2020    Bone Lab Results  Component Value Date   VD25OH 77 04/27/2020   25OHVITD1 51 12/06/2020   25OHVITD2 <1.0 12/06/2020   25OHVITD3 51 12/06/2020    Inflammation (CRP: Acute Phase) (ESR: Chronic Phase) Lab Results  Component Value Date   CRP <1 12/06/2020   ESRSEDRATE 9 12/06/2020         Note: Above Lab results reviewed.  Recent Imaging Review  DG Thoracic Spine W/Swimmers CLINICAL DATA:  Chronic lower left thoracic spine pain. No known injury.  EXAM: THORACIC SPINE - 3 VIEWS  COMPARISON:  None.  FINDINGS: There is no evidence of thoracic spine fracture. Alignment is normal. Minimal anterior osteophyte formation is noted at multiple levels in the lower thoracic spine without significant disc  space narrowing.  IMPRESSION: Minimal degenerative changes are noted.  No acute abnormality seen.  Electronically Signed   By: Marijo Conception M.D.   On: 01/24/2021 11:32 Note: Reviewed        Physical Exam  General appearance: Well nourished, well developed, and well hydrated. In no apparent acute distress Mental status: Alert, oriented x 3 (person, place, & time)       Respiratory: No evidence of acute respiratory distress Eyes: PERLA Vitals: BP 123/74   Pulse 87   Temp (!) 97.3 F (36.3 C) (Temporal)   Resp 18   Ht $R'5\' 2"'RI$  (1.575 m)   Wt 134 lb (60.8 kg)   SpO2 100%   BMI 24.51 kg/m  BMI: Estimated body mass index is 24.51 kg/m as calculated from the following:   Height as of this encounter: $RemoveBeforeD'5\' 2"'CibPWtBrPPxEJO$  (1.575 m).   Weight as of this encounter: 134 lb (60.8 kg). Ideal: Ideal body weight: 50.1 kg (110 lb 7.2 oz) Adjusted ideal body weight: 54.4 kg (119 lb 13.9 oz)  Assessment   Status Diagnosis  Persistent Persistent Persistent 1. Chronic thoracic back pain (1ry area of Pain) (Bilateral) (L>R)   2. Chronic upper back pain (Bilateral)   3. Spondylosis without myelopathy or radiculopathy, thoracic region   4. Thoracic facet syndrome (Bilateral)   5. Other intervertebral disc degeneration, thoracic region   6. Chronic pain syndrome   7. Myofascial pain syndrome of thoracic spine   8. Anxiety due to invasive procedure   9. Pharmacologic therapy   10. Chronic use of opiate for therapeutic purpose   11. Encounter for medication management      Updated Problems: No problems updated.  Plan of Care  Problem-specific:  No problem-specific Assessment & Plan notes found for this encounter.  [name] has a current medication list which includes the following long-term medication(s): amitriptyline, metoprolol succinate, sumatriptan, and tramadol.  Pharmacotherapy (Medications Ordered): Meds ordered this encounter  Medications   traMADol (ULTRAM) 50 MG tablet     Sig: Take 1 tablet (50 mg total) by mouth every 6 (six) hours as needed for severe pain.    Dispense:  120 tablet    Refill:  2    Not a duplicate. Do NOT delete! Dispense 1 day early if closed on fill date. Warn not to take CNS-depressants 8 hours before or after taking opioid. Do not send refill request. Renewal requires appointment.    Orders:  Orders Placed This Encounter  Procedures   CT THORACIC SPINE WO CONTRAST    Patient presents with axial pain with possible radicular component. Please assist [place] in identifying specific level(s) and laterality of any additional findings such as: 1. Facet (Zygapophyseal) joint DJD (Hypertrophy, space narrowing, subchondral sclerosis, and/or osteophyte formation) 2. DDD and/or IVDD (Loss of disc height, desiccation, gas patterns, osteophytes, endplate sclerosis, or "Black disc disease") 3. Pars defects 4. Spondylolisthesis, spondylosis, and/or spondyloarthropathies (include Degree/Grade of displacement in mm) (stability) 5. Vertebral body Fractures (acute/chronic) (state percentage of collapse) 6. Demineralization (osteopenia/osteoporotic) 7. Bone pathology 8. Foraminal narrowing  9. Surgical changes 10. Central, Lateral Recess, and/or Foraminal Stenosis (include AP diameter of stenosis in mm) 11. Surgical changes (hardware type, status, and presence of fibrosis) 12. Modic Type Changes (MRI only) 13. IVDD (Disc bulge, protrusion, herniation, extrusion) (Level, laterality, extent)    Standing Status:   Future    Standing Expiration Date:   04/07/2021    Scheduling Instructions:     Imaging must be done as soon as possible. Inform patient that order will expire within 30 days  and I will not renew it.    Order Specific Question:   Is patient pregnant?    Answer:   No    Order Specific Question:   Preferred imaging location?    Answer:    [facility]    Order Specific Question:   Call Results- Best Contact Number?    Answer:   [phone]  ([facility])    Order Specific Question:   Radiology Contrast Protocol - do NOT remove file path    Answer:   [email or web address]    Follow-up plan:   Return in about 3 months (around 06/05/2021) for Eval-day (M,W), (F2F), (MM).     Interventional Therapies  Risk  Complexity Considerations:   Estimated body mass index is 24.51 kg/m as calculated from the following:   Height as of this encounter: $RemoveBeforeD'5\' 2"'RLCHldTBkrefGy$  (1.575 m).   Weight as of this encounter: 134 lb (60.8 kg). WNL   Planned  Pending:   Diagnostic bilateral thoracic facet MBB #1    Under consideration:   Diagnostic/therapeutic trigger point injections  Diagnostic bilateral thoracic facet block    Completed:   None at this time   Therapeutic  Palliative (PRN) options:   None established    Recent Visits Date Type Provider Dept  02/07/21 Office Visit Milinda Pointer, MD [facility]  Showing recent visits within past 90 days and meeting all other requirements Today's Visits Date Type Provider Dept  03/07/21 Office Visit Milinda Pointer, MD [facility]  Showing today's visits and meeting all other requirements Future Appointments Date Type Provider Dept  06/01/21 Appointment Milinda Pointer, MD [facility]  Showing future appointments within next 90 days and meeting all other requirements I discussed the assessment and treatment plan with the patient. The patient was provided an opportunity to ask questions and all were answered. The patient agreed with the plan and demonstrated an understanding of the instructions.  Patient advised to call back or seek an in-person evaluation if the symptoms or condition worsens.  Duration of encounter: 30 minutes.  Note by: Gaspar Cola, MD Date: 03/07/2021; Time: 5:47 PM

## 2021-03-07 ENCOUNTER — Other Ambulatory Visit (HOSPITAL_COMMUNITY): Payer: Self-pay

## 2021-03-07 ENCOUNTER — Other Ambulatory Visit: Payer: Self-pay

## 2021-03-07 ENCOUNTER — Encounter: Payer: Self-pay | Admitting: Pain Medicine

## 2021-03-07 ENCOUNTER — Ambulatory Visit: Payer: No Typology Code available for payment source | Attending: Pain Medicine | Admitting: Pain Medicine

## 2021-03-07 VITALS — BP 123/74 | HR 87 | Temp 97.3°F | Resp 18 | Ht 62.0 in | Wt 134.0 lb

## 2021-03-07 DIAGNOSIS — F419 Anxiety disorder, unspecified: Secondary | ICD-10-CM

## 2021-03-07 DIAGNOSIS — M47814 Spondylosis without myelopathy or radiculopathy, thoracic region: Secondary | ICD-10-CM | POA: Diagnosis present

## 2021-03-07 DIAGNOSIS — M549 Dorsalgia, unspecified: Secondary | ICD-10-CM | POA: Insufficient documentation

## 2021-03-07 DIAGNOSIS — Z79891 Long term (current) use of opiate analgesic: Secondary | ICD-10-CM

## 2021-03-07 DIAGNOSIS — M47894 Other spondylosis, thoracic region: Secondary | ICD-10-CM

## 2021-03-07 DIAGNOSIS — G894 Chronic pain syndrome: Secondary | ICD-10-CM

## 2021-03-07 DIAGNOSIS — Z79899 Other long term (current) drug therapy: Secondary | ICD-10-CM

## 2021-03-07 DIAGNOSIS — M546 Pain in thoracic spine: Secondary | ICD-10-CM | POA: Insufficient documentation

## 2021-03-07 DIAGNOSIS — M7918 Myalgia, other site: Secondary | ICD-10-CM | POA: Diagnosis present

## 2021-03-07 DIAGNOSIS — M5134 Other intervertebral disc degeneration, thoracic region: Secondary | ICD-10-CM | POA: Diagnosis present

## 2021-03-07 DIAGNOSIS — G8929 Other chronic pain: Secondary | ICD-10-CM

## 2021-03-07 MED ORDER — TRAMADOL HCL 50 MG PO TABS
50.0000 mg | ORAL_TABLET | Freq: Four times a day (QID) | ORAL | 2 refills | Status: DC | PRN
  Filled 2021-03-07 – 2021-03-08 (×2): qty 120, 30d supply, fill #0
  Filled 2021-04-05 (×2): qty 120, 30d supply, fill #1
  Filled 2021-05-03: qty 120, 30d supply, fill #2

## 2021-03-07 NOTE — Progress Notes (Signed)
Nursing Pain Medication Assessment:  Safety precautions to be maintained throughout the outpatient stay will include: orient to surroundings, keep bed in low position, maintain call bell within reach at all times, provide assistance with transfer out of bed and ambulation.  Medication Inspection Compliance: Pill count conducted under aseptic conditions, in front of the patient. Neither the pills nor the bottle was removed from the patient's sight at any time. Once count was completed pills were immediately returned to the patient in their original bottle.  Medication: Tramadol (Ultram) Pill/Patch Count:  6 of 120 pills remain Pill/Patch Appearance: Markings consistent with prescribed medication Bottle Appearance: Standard pharmacy container. Clearly labeled. Filled Date: 09 / 19 / 2022 Last Medication intake:  Today

## 2021-03-08 ENCOUNTER — Other Ambulatory Visit (HOSPITAL_COMMUNITY): Payer: Self-pay

## 2021-03-18 ENCOUNTER — Other Ambulatory Visit (HOSPITAL_COMMUNITY): Payer: Self-pay

## 2021-03-21 ENCOUNTER — Ambulatory Visit (INDEPENDENT_AMBULATORY_CARE_PROVIDER_SITE_OTHER): Payer: No Typology Code available for payment source | Admitting: Family

## 2021-03-21 ENCOUNTER — Other Ambulatory Visit: Payer: Self-pay

## 2021-03-21 ENCOUNTER — Encounter: Payer: Self-pay | Admitting: Family

## 2021-03-21 VITALS — BP 116/80 | HR 81 | Temp 98.1°F | Ht 62.0 in | Wt 135.6 lb

## 2021-03-21 DIAGNOSIS — G8929 Other chronic pain: Secondary | ICD-10-CM

## 2021-03-21 DIAGNOSIS — R000 Tachycardia, unspecified: Secondary | ICD-10-CM

## 2021-03-21 DIAGNOSIS — G43709 Chronic migraine without aura, not intractable, without status migrainosus: Secondary | ICD-10-CM | POA: Diagnosis not present

## 2021-03-21 DIAGNOSIS — M549 Dorsalgia, unspecified: Secondary | ICD-10-CM | POA: Diagnosis not present

## 2021-03-21 NOTE — Patient Instructions (Signed)
Welcome to [facility]! It was a pleasure meeting you today.  As discussed, Please schedule an annual physical with fasting labs today, and we can see you sooner for any concerns if needed.   PLEASE NOTE:  If you had any LAB tests please let us know if you have not heard back within a few days. You may see your results on MyChart before we have a chance to review them but we will give you a call once they are reviewed by [place]. If we ordered any REFERRALS today, please let us know if you have not heard from their office within the next week.  Let us know through MyChart if you are needing REFILLS, or have your pharmacy send [place] the request. You can also use MyChart to communicate with me or any office staff.  Please try these tips to maintain a healthy lifestyle:  Eat most of your calories during the day when you are active. Eliminate processed foods including packaged sweets (pies, cakes, cookies), reduce intake of potatoes, white bread, white pasta, and white rice. Look for whole grain options, oat flour or almond flour.  Each meal should contain half fruits/vegetables, one quarter protein, and one quarter carbs (no bigger than a computer mouse).  Cut down on sweet beverages. This includes juice, soda, and sweet tea. Also watch fruit intake, though this is a healthier sweet option, it still contains natural sugar! Limit to 3 servings daily.  Drink at least 1 glass of water with each meal and aim for at least 8 glasses per day  Exercise at least 150 minutes every week.

## 2021-03-21 NOTE — Progress Notes (Signed)
New Patient Office Visit  Subjective:  Patient ID: Karen Schwartz, female    DOB: Jan 22, 1971  Age: 50 y.o. MRN: [id number]  CC:  Chief Complaint  Patient presents with   New Patient (Initial Visit)    Previously seen by [facility]    HPI Karen Schwartz presents to establish care. She has chronic migraines that require preventative as well as abortive RX medication. Tachycardia: reports having for years, cardiac workup negative, pt keeps controlled with Metoprolol bid. Denies dizziness or tiredness.  Pain She reports chronic back pain. was not an injury that may have caused the pain. The pain started  years ago  and is staying constant. The pain does not radiate. The pain is described as aching, soreness, stiffness, and throbbing, occurring every day. Symptoms are worse in the: all day & night.  Aggravating factors: sitting, standing, walking, and lifting, pulling.  Relieving factors: medication Tramadol, muscle relaxers prn and rest .  She has tried application of heat, application of ice, acetaminophen, NSAIDs, topical anesthetics, physical thearpy, and message therapy with little relief, reports she does get relief with therapeutic massage, but does not get more often d/t expense.  ---------------------------------------------------------------------------------------------------  Headache: Patient complains of headache. She does not have a headache at this time.  Description of Headaches: Radiation of pain?:none Character of pain:stabbing and throbbing Accompanying symptoms: nausea, photophobia Prodromal sx?: none Rapidity of onset: gradual Typical duration of individual headache: 3 days Are most headaches similar in presentation? yes Typical precipitants: menses Temporal Pattern of Headaches: Started having HAs years ago Overall pattern since problem began: unchanged Degree of Functional Impairment: moderate Current Use of Meds to Treat HA: Abortive meds?  sumatriptan PO Daily use? no Prophylactic meds? beta-blockers (Toprol), Elavil Additional Relevant History: History of head/neck trauma? no History of head/neck surgery? no Family h/o headache problems? no Use of meds that might worsen HAs? no Exposure to carbon monoxide? no Substance use: caffeine: mild    Past Medical History:  Diagnosis Date   Chronic pain syndrome 12/06/2020   Fibromyalgia    Hypertension    boderline   IBS (irritable bowel syndrome)     History reviewed. No pertinent surgical history.  Family History  Problem Relation Age of Onset   Hypertension Mother    Depression Mother    Stroke Mother    Cancer Father        bladder cancer, rectal cancer   Atrial fibrillation Father    Hypertension Sister    Depression Sister    Mental illness Brother        suicide   Alcohol abuse Brother    Diabetes Maternal Grandmother    Macular degeneration Maternal Grandmother    Dementia Maternal Grandfather    Colon cancer Neg Hx    Esophageal cancer Neg Hx    Inflammatory bowel disease Neg Hx    Liver disease Neg Hx    Pancreatic cancer Neg Hx    Rectal cancer Neg Hx    Stomach cancer Neg Hx     Social History   Socioeconomic History   Marital status: Married    Spouse name: Not on file   Number of children: 2   Years of education: Not on file   Highest education level: Not on file  Occupational History   Occupation: RN  Tobacco Use   Smoking status: Former    Packs/day: 1.00    Years: 12.00    Pack years: 12.00    Types:  Cigarettes    Quit date: 05/23/1999    Years since quitting: 21.8   Smokeless tobacco: Never  Vaping Use   Vaping Use: Never used  Substance and Sexual Activity   Alcohol use: No    Alcohol/week: 0.0 standard drinks    Comment: beer   Drug use: No   Sexual activity: Yes    Birth control/protection: None    Comment: divorced, remarried, 2 kids, RN  Other Topics Concern   Not on file  Social History Narrative   Not on file    Social Determinants of Health   Financial Resource Strain: Not on file  Food Insecurity: Not on file  Transportation Needs: Not on file  Physical Activity: Not on file  Stress: Not on file  Social Connections: Not on file  Intimate Partner Violence: Not on file    Objective:   Today's Vitals: BP 116/80   Pulse 81   Temp 98.1 F (36.7 C) (Temporal)   Ht 5\' 2"  (1.575 m)   Wt 135 lb 9.6 oz (61.5 kg)   SpO2 98%   BMI 24.80 kg/m   Physical Exam Vitals and nursing note reviewed.  Constitutional:      Appearance: Normal appearance.  Cardiovascular:     Rate and Rhythm: Normal rate and regular rhythm.  Pulmonary:     Effort: Pulmonary effort is normal.     Breath sounds: Normal breath sounds.  Musculoskeletal:     Lumbar back: Decreased range of motion.  Skin:    General: Skin is warm and dry.  Neurological:     Mental Status: She is alert.  Psychiatric:        Mood and Affect: Mood normal.        Behavior: Behavior normal.    Assessment & Plan:   Problem List Items Addressed This Visit       Cardiovascular and Mediastinum   Migraines    Taking Elavil and Metoprolol as preventative and Imitrex for abortive.        Other   Chronic upper back pain (Bilateral) - Primary (Chronic)    Taking Tramadol tid, states this is only med that helps her daily pain, muscle relaxers help for acute on chronic pain, Elavil helps also. Pt has seen [facility] and have discussed different options including epidural injections and nerve ablation. Pt is very undecided, recommended getting 2nd opinion from a neurosurgeon.      Tachycardia    Stable with Metoprolol bid      Time spent with patient today was 30 minutes which consisted of chart review, discussing diagnosis, work up, treatment answering questions and documentation.   Outpatient Encounter Medications as of 03/21/2021  Medication Sig   amitriptyline (ELAVIL) 50 MG tablet TAKE 1 TABLET BY MOUTH AT BEDTIME.    diazepam (VALIUM) 5 MG tablet Take 1 tablet 60 minutes before scheduled procedure. Wait 30 minutes. If still anxious, take the second tablet 30 minutes before procedure.   guaiFENesin (MUCINEX) 600 MG 12 hr tablet Take 600 mg by mouth 2 (two) times daily. PRN - pt takes for fibromyalgia sx, off label   melatonin 5 MG TABS Take 5 mg by mouth at bedtime.   metoprolol succinate (TOPROL-XL) 25 MG 24 hr tablet TAKE 1/2 TABLET BY MOUTH EVERY MORNING AND 1 TABLET BY MOUTH EVERY EVENING (Patient taking differently: Take 1/2 every evening and 1 tablet every morning.)   polyethylene glycol (MIRALAX / GLYCOLAX) packet Take 17 g by mouth daily.  Sodium Fluoride 1.1 % PSTE USE AS TOOTHPASTE FOR BRUSHING   SUMAtriptan (IMITREX) 100 MG tablet TAKE 1 TABLET BY MOUTH EVERY 2 HOURS AS NEEDED AS DIRECTED   traMADol (ULTRAM) 50 MG tablet Take 1 tablet by mouth every 6 hours as needed for severe pain.   [DISCONTINUED] hydrOXYzine (ATARAX/VISTARIL) 25 MG tablet Take 1 tablet (25 mg total) by mouth 3 (three) times daily as needed. (Patient not taking: No sig reported)   [DISCONTINUED] methocarbamol (ROBAXIN) 750 MG tablet TAKE 1 TABLET BY MOUTH 3 TIMES DAILY (Patient not taking: Reported on 03/21/2021)   [DISCONTINUED] naproxen (NAPROSYN) 500 MG tablet Take 500 mg by mouth 2 (two) times daily with a meal. (Patient not taking: Reported on 03/21/2021)   [DISCONTINUED] permethrin (ELIMITE) 5 % cream Thoroughly massage cream from head to soles of feet; leave on for 8 to 14 hours before removing (shower or bath). May repeat if living mites are observed 14 days after first treatment (Patient not taking: Reported on 03/21/2021)   No facility-administered encounter medications on file as of 03/21/2021.    Follow-up: Return if symptoms worsen or fail to improve, for  Complete physical w/fasting labs.   Jeanie Sewer, NP

## 2021-03-24 ENCOUNTER — Encounter: Payer: Self-pay | Admitting: Family

## 2021-03-24 NOTE — Assessment & Plan Note (Signed)
Taking Elavil and Metoprolol as preventative and Imitrex for abortive.

## 2021-03-24 NOTE — Assessment & Plan Note (Signed)
Stable with Metoprolol bid

## 2021-03-24 NOTE — Assessment & Plan Note (Addendum)
Taking Tramadol tid, states this is only med that helps her daily pain, muscle relaxers help for acute on chronic pain, Elavil helps also. Pt has seen [facility] and have discussed different options including epidural injections and nerve ablation. Pt is very undecided, recommended getting 2nd opinion from a neurosurgeon.

## 2021-03-25 ENCOUNTER — Other Ambulatory Visit: Payer: Self-pay

## 2021-03-25 ENCOUNTER — Ambulatory Visit
Admission: RE | Admit: 2021-03-25 | Discharge: 2021-03-25 | Disposition: A | Discharge status: Home / Self Care | Payer: No Typology Code available for payment source | Source: Ambulatory Visit | Attending: Pain Medicine | Admitting: Pain Medicine

## 2021-03-25 DIAGNOSIS — M47814 Spondylosis without myelopathy or radiculopathy, thoracic region: Secondary | ICD-10-CM | POA: Diagnosis present

## 2021-03-25 DIAGNOSIS — M47894 Other spondylosis, thoracic region: Secondary | ICD-10-CM | POA: Diagnosis present

## 2021-03-25 DIAGNOSIS — M549 Dorsalgia, unspecified: Secondary | ICD-10-CM | POA: Diagnosis present

## 2021-03-25 DIAGNOSIS — M546 Pain in thoracic spine: Secondary | ICD-10-CM | POA: Insufficient documentation

## 2021-03-25 DIAGNOSIS — M5134 Other intervertebral disc degeneration, thoracic region: Secondary | ICD-10-CM | POA: Diagnosis not present

## 2021-03-25 DIAGNOSIS — G8929 Other chronic pain: Secondary | ICD-10-CM | POA: Diagnosis present

## 2021-04-05 ENCOUNTER — Other Ambulatory Visit (HOSPITAL_COMMUNITY): Payer: Self-pay

## 2021-04-20 ENCOUNTER — Ambulatory Visit (INDEPENDENT_AMBULATORY_CARE_PROVIDER_SITE_OTHER): Payer: No Typology Code available for payment source | Admitting: Family

## 2021-04-20 ENCOUNTER — Other Ambulatory Visit: Payer: Self-pay

## 2021-04-20 ENCOUNTER — Encounter: Payer: Self-pay | Admitting: Family

## 2021-04-20 VITALS — BP 118/76 | HR 85 | Temp 98.0°F | Ht 62.0 in | Wt 136.2 lb

## 2021-04-20 DIAGNOSIS — Z0000 Encounter for general adult medical examination without abnormal findings: Secondary | ICD-10-CM

## 2021-04-20 LAB — COMPREHENSIVE METABOLIC PANEL
ALT: 22 U/L (ref 0–35)
AST: 23 U/L (ref 0–37)
Albumin: 3.9 g/dL (ref 3.5–5.2)
Alkaline Phosphatase: 43 U/L (ref 39–117)
BUN: 14 mg/dL (ref 6–23)
CO2: 30 mEq/L (ref 19–32)
Calcium: 8.6 mg/dL (ref 8.4–10.5)
Chloride: 104 mEq/L (ref 96–112)
Creatinine, Ser: 0.72 mg/dL (ref 0.40–1.20)
GFR: 98.43 mL/min (ref >60.00)
Glucose, Bld: 94 mg/dL (ref 70–99)
Potassium: 4 mEq/L (ref 3.5–5.1)
Sodium: 140 mEq/L (ref 135–145)
Total Bilirubin: 0.3 mg/dL (ref 0.2–1.2)
Total Protein: 6.1 g/dL (ref 6.0–8.3)

## 2021-04-20 LAB — CBC WITH DIFFERENTIAL/PLATELET
Basophils Absolute: 0 10*3/uL (ref 0.0–0.1)
Basophils Relative: 0.7 % (ref 0.0–3.0)
Eosinophils Absolute: 0.2 10*3/uL (ref 0.0–0.7)
Eosinophils Relative: 3 % (ref 0.0–5.0)
HCT: 41.3 % (ref 36.0–46.0)
Hemoglobin: 13.6 g/dL (ref 12.0–15.0)
Lymphocytes Relative: 42.2 % (ref 12.0–46.0)
Lymphs Abs: 2.5 10*3/uL (ref 0.7–4.0)
MCHC: 32.9 g/dL (ref 30.0–36.0)
MCV: 89 fl (ref 78.0–100.0)
Monocytes Absolute: 0.5 10*3/uL (ref 0.1–1.0)
Monocytes Relative: 8.7 % (ref 3.0–12.0)
Neutro Abs: 2.7 10*3/uL (ref 1.4–7.7)
Neutrophils Relative %: 45.4 % (ref 43.0–77.0)
Platelets: 223 10*3/uL (ref 150.0–400.0)
RBC: 4.63 Mil/uL (ref 3.87–5.11)
RDW: 12.7 % (ref 11.5–15.5)
WBC: 6 10*3/uL (ref 4.0–10.5)

## 2021-04-20 LAB — LIPID PANEL
Cholesterol: 139 mg/dL (ref 0–200)
HDL: 52 mg/dL (ref >39.00)
LDL Cholesterol: 60 mg/dL (ref 0–99)
NonHDL: 87.11
Total CHOL/HDL Ratio: 3
Triglycerides: 137 mg/dL (ref 0.0–149.0)
VLDL: 27.4 mg/dL (ref 0.0–40.0)

## 2021-04-20 LAB — TSH: TSH: 3 u[IU]/mL (ref 0.35–5.50)

## 2021-04-20 NOTE — Progress Notes (Signed)
Phone [phone]   Subjective:   Patient is a 50 y.o. female presenting for annual physical.    Chief Complaint  Patient presents with   Annual Exam    Fasting     See problem oriented charting- ROS- full  review of systems was completed and negative.   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   Atopic dermatitis 12/06/2020   Contact dermatitis and other eczema due to other chemical products 12/06/2020   Other chronic allergic conjunctivitis 12/06/2020   Pharmacologic therapy 12/06/2020   Disorder of skeletal system 12/06/2020   Problems influencing health status 12/06/2020   Myofascial pain syndrome of thoracic spine 12/06/2020   Chronic constipation 02/11/2018   Bloating 02/11/2018   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 No pertinent surgical history.  Family History  Problem Relation Age of Onset   Hypertension Mother    Depression Mother    Stroke Mother    Cancer Father        bladder cancer, rectal cancer   Atrial fibrillation Father    Hypertension Sister    Depression Sister    Mental illness Brother        suicide   Alcohol abuse Brother    Diabetes Maternal Grandmother    Macular degeneration Maternal Grandmother    Dementia Maternal Grandfather    Colon cancer Neg Hx    Esophageal cancer Neg Hx    Inflammatory bowel disease Neg Hx     Liver disease Neg Hx    Pancreatic cancer Neg Hx    Rectal cancer Neg Hx    Stomach cancer Neg Hx     Medications- reviewed and updated Current Outpatient Medications  Medication Sig Dispense Refill   amitriptyline (ELAVIL) 50 MG tablet TAKE 1 TABLET BY MOUTH AT BEDTIME. 90 tablet 1   diazepam (VALIUM) 5 MG tablet Take 1 tablet 60 minutes before scheduled procedure. Wait 30 minutes. If still anxious, take the second tablet 30 minutes before procedure. 2 tablet 0   guaiFENesin (MUCINEX) 600 MG 12 hr tablet Take 600 mg by mouth 2 (two) times daily. PRN - pt takes for fibromyalgia sx, off label     melatonin 5 MG TABS Take 5 mg by mouth at bedtime.     metoprolol succinate (TOPROL-XL) 25 MG 24 hr tablet TAKE 1/2 TABLET BY MOUTH EVERY MORNING AND 1 TABLET BY MOUTH EVERY EVENING (Patient taking differently: Take 1/2 every evening and 1 tablet every morning.) 135 tablet 3   polyethylene glycol (MIRALAX / GLYCOLAX) packet Take 17 g by mouth daily.     Sodium Fluoride 1.1 % PSTE USE AS TOOTHPASTE FOR BRUSHING 100 mL 4   SUMAtriptan (IMITREX) 100 MG tablet TAKE 1 TABLET BY MOUTH EVERY 2 HOURS AS NEEDED AS DIRECTED 9 tablet 3   traMADol (ULTRAM) 50 MG tablet Take 1 tablet by mouth every 6 hours as needed for severe pain.  120 tablet 2   No current facility-administered medications for this visit.    Allergies-reviewed and updated Allergies  Allergen Reactions   Ampicillin    Cymbalta [Duloxetine Hcl]    Latex Itching   Lyrica [Pregabalin]    Zantac [Ranitidine Hcl]     Tachycardia    Social History   Social History Narrative   Not on file   Objective  Objective:  BP 118/76   Pulse 85   Temp 98 F (36.7 C) (Temporal)   Ht _0  (1.575 m)   Wt 136 lb 4 oz (61.8 kg)   SpO2 99%   BMI 24.92 kg/m  Gen: NAD, resting comfortably HEENT: Mucous membranes are moist. Oropharynx normal Neck: no thyromegaly CV: RRR no murmurs rubs or gallops Lungs: CTAB no crackles, wheeze,  rhonchi Abdomen: soft/nontender/nondistended/normal bowel sounds. No rebound or guarding.  Ext: no edema Skin: warm, dry Neuro: grossly normal, moves all extremities, PERRLA   Assessment and Plan   Health Maintenance counseling: 1. Anticipatory guidance: Patient counseled regarding regular dental exams q6 months, eye exams,  avoiding smoking and second hand smoke, limiting alcohol to 1 beverage per day, no illicit drugs.   2. Risk factor reduction:  Advised patient of need for regular exercise and diet rich and fruits and vegetables to reduce risk of heart attack and stroke. Exercise- none, encourage 20-61mn daily.  Wt Readings from Last 3 Encounters:  04/20/21 136 lb 4 oz (61.8 kg)  03/21/21 135 lb 9.6 oz (61.5 kg)  03/07/21 134 lb (60.8 kg)   3. Immunizations/screenings/ancillary studies Immunization History  Administered Date(s) Administered   Hepatitis A 12/02/2009   Hepatitis B 05/22/1992   Influenza Split 03/05/2013, 02/20/2015   Influenza Whole 04/28/2002   Influenza,inj,Quad PF,6+ Mos 02/20/2019, 03/10/2020   Influenza-Unspecified 02/19/2014, 02/13/2020, 02/19/2021   MMR 06/29/2003, 05/26/2004   PFIZER(Purple Top)SARS-COV-2 Vaccination 05/29/2019, 06/17/2019   Tdap 05/22/1996, 04/18/2005, 04/30/2012   Health Maintenance Due  Topic Date Due   COLONOSCOPY (Pts 45-475yrInsurance coverage will need to be confirmed)  Never done   PAP SMEAR-Modifier  11/06/2020    4. Cervical cancer screening- UTD 5. Breast cancer screening-  mammogram due 05/2021 6. Colon cancer screening - never; father had bladder & rectal. Sending order today. 7. Skin cancer screening- advised regular sunscreen use. Denies worrisome, changing, or new skin lesions.  8. Birth control/STD check- None/N/A 9. Osteoporosis screening- N/A 10. Smoking associated screening - non- smoker  Problem List Items Addressed This Visit   None Visit Diagnoses     Annual physical exam    -  Primary   Relevant Orders    Comprehensive metabolic panel   TSH   Lipid panel   CBC with Differential/Platelet   Ambulatory referral to Gastroenterology       Recommended follow up: Return for already scheduled. Future Appointments  Date Time Provider [place]1/03/2022  9:40 AM NaMilinda PointerMD [facility] None    Lab/Order associations: fasting   ICD-10-CM   1. Annual physical exam  Z00.00 Comprehensive metabolic panel    TSH    Lipid panel    CBC with Differential/Platelet    Ambulatory referral to Gastroenterology      No orders of the defined types were placed in this encounter.   Return precautions advised.  HuJeanie SewerNP

## 2021-04-20 NOTE — Patient Instructions (Signed)

## 2021-04-25 NOTE — Progress Notes (Signed)
All labs are within normal range.  Glucose (sugar), electrolytes, kidney and liver function, blood count, thyroid, and cholesterol numbers are all good.   Keep up the good work with controlling your diet and continue to try and shoot for 30 minutes of exercise daily!

## 2021-05-03 ENCOUNTER — Other Ambulatory Visit: Payer: Self-pay | Admitting: Family

## 2021-05-03 ENCOUNTER — Other Ambulatory Visit (HOSPITAL_COMMUNITY): Payer: Self-pay

## 2021-05-05 ENCOUNTER — Encounter: Payer: Self-pay | Admitting: Family

## 2021-05-05 ENCOUNTER — Other Ambulatory Visit (HOSPITAL_COMMUNITY): Payer: Self-pay

## 2021-05-05 ENCOUNTER — Other Ambulatory Visit: Payer: Self-pay | Admitting: Family

## 2021-05-05 DIAGNOSIS — G8929 Other chronic pain: Secondary | ICD-10-CM

## 2021-05-05 MED ORDER — METHOCARBAMOL 750 MG PO TABS
750.0000 mg | ORAL_TABLET | Freq: Three times a day (TID) | ORAL | 0 refills | Status: DC | PRN
  Filled 2021-05-05: qty 90, 30d supply, fill #0

## 2021-05-30 NOTE — Progress Notes (Signed)
PROVIDER NOTE: Information contained herein reflects review and annotations entered in association with encounter. Interpretation of such information and data should be left to medically-trained personnel. Information provided to patient can be located elsewhere in the medical record under "Patient Instructions". Document created using STT-dictation technology, any transcriptional errors that may result from process are unintentional.    Patient: Karen Schwartz  Service Category: E/M  Provider: Gaspar Cola, MD  DOB: 17-Dec-1970  DOS: 06/01/2021  Specialty: Interventional Pain Management  MRN: [id number]  Setting: Ambulatory outpatient  PCP: Jeanie Sewer, NP  Type: Established Patient    Referring Provider: Eulogio Bear, NP  Location: Office  Delivery: Face-to-face     HPI  Karen Schwartz, a 51 y.o. year old female, is here today because of her No primary diagnosis found.. Karen Schwartz's primary complain today is Back Pain (Mid, left) Last encounter: My last encounter with her was on 03/07/2021. Pertinent problems: [name] has Fibromyalgia; Raynauds phenomenon; Migraines; Chronic pain syndrome; Chronic thoracic back pain (1ry area of Pain) (Bilateral) (L>R); Myofascial pain syndrome of thoracic spine; Thoracic facet syndrome (Bilateral); Chronic upper back pain (Bilateral); Spondylosis without myelopathy or radiculopathy, thoracic region; and Thoracic facet hypertrophy on their pertinent problem list. Pain Assessment: Severity of Chronic pain is reported as a 2 /10. Location: Back  /Denies. Onset: Other (comment). Quality: Nagging, Burning, Aching. Timing: Constant. Modifying factor(s): meds, heat. Vitals:  height is $RemoveB'5\' 2"'WykinEyf$  (1.575 m) and weight is 136 lb (61.7 kg). Her temperature is 97.3 F (36.3 C) (abnormal). Her blood pressure is 101/73 and her pulse is 87. Her respiration is 15 and oxygen saturation is 100%.   Reason for encounter: medication management.  The patient was  initially seen on 12/06/2020 with her second visit being on 02/07/2021.  At that time, I schedule the patient to return for a bilateral vs a left-sided thoracic facet block #1.  The patient returned on 03/07/2021 indicating that her insurance had denied the thoracic facet block.  She then indicated that she was 782% certain that the physical therapy would aggravate her condition and that she would not be able to continue working for this reason she declined a referral to physical therapy.  We then had a very long conversation regarding the neck step and it was at this point that the patient indicated that she was not too crazy about having any interventional therapies.  The patient had been previously informed on 2 different locations that due to manpower issues I would not be able to take patients for medication management only.  For this reason I informed her that she needed to get somebody to continue writing for her medicine since she was not interested in the therapy that I had to offer.  On the 03/07/2021 appointment I refilled her medication with the understanding that she would be looking for a PCP that would not mind continuing to write for her tramadol.  On the patient's last appointment on 03/07/2021 I had ordered a CT scan of the thoracic spine for the purpose of further looking into the problem that she has been having with the upper back pain in the thoracic region.  At the time the patient was not sure if she was going to have the CT scan done, but eventually she decided to go for it and on 03/25/2021 she had the thoracic CT scan done.  As suspected, it came back showing multilevel facet hypertrophy with no other significant pathology.  The patient  clarified that she has already had physical therapy in the past and it always causes a flareup of her back pain.  This is typical and diagnostic of facet syndrome.  The reason why it actually get worse is because the activity will further grind the facet  arthropathy causing an increase in the pain.  The pain does not improve because its not muscular.  Today the pain is more localized to the left side of the thoracic spinous processes right at the level of her bra strap.  Although the patient does have multilevel facet hypertrophy, at this point, it is likely that most of the pain may be coming from 2 or 3 of the facet joints on the left side of the spine  Physical exam: Inspection of the upper back shows some flattening of the thoracolumbar lordotic curves with some lateral bending. Palpation of the affected area shows point tenderness over the left paramedial region over the facet joints, at the level of the bra strap. Pain does not radiate any where else. Lateral bending of thoracic region towards the left, with overloading of the facet joints does reproduce the patient's symptoms.           Statement of Medical Necessity:  Ms. McBridecontinues to experienced debilitating chronic nerve-associated pain from the Thoracic Facet Syndrome (Spondylosis without myelopathy or radiculopathy, thoracic region [M46.803]).  Duration: This pain has persisted for longer than three months.  Non-surgical care: The patient has either failed to respond, or was unable to tolerate, or simply did not get enough benefit from other more conservative therapies including, but not limited to: 1. Over-the-counter oral analgesic medications (i.e.: ibuprofen, naproxen, etc.) 2. Anti-inflammatory medications 3. Muscle relaxants 4. Membrane stabilizers 5. Opioids 6. Physical therapy (PT), chiropractic manipulation, and/or home exercise program (HEP). 7. Modalities (Heat, ice, etc.)  Invasive therapies:  None none at this point despite the fact that the gold standard for the diagnosis of facet pain is that of diagnostic facet blocks.  Surgical care: Not indicated.  Physical exam: Has been consistent with Thoracic Facet Syndrome.  Diagnostic imaging: Thoracic Facet  Hypertrophy on CT of the thoracic spine. Please see embedded images.  Diagnostic interventional therapies: Pending approval.  For the above listed reason, I believe, as the examining and treating physician, that it is medically necessary to proceed with  diagnostic thoracic facet block  for the purpose of attempting to prolong the duration of the benefits seen with the diagnostic injections.  At this point, after having personally evaluated this patient and being the treating attending physician, it is my professional opinion, as a board-certified pain management specialist, that it is medically necessary for this patient to undergo the treatments that I am ordering.  Any attempt at denying these treatments, by a health care insurance entity, represents a case of "practicing medicine without a license", which is currently illegal in the state of [place].  Furthermore, if this company attempts to use a Mudlogger" to deny such treatment without having performed a face-to-face evaluation, represents an act of gross negligence and malpractice.  RTCB: 09/03/2021  Pharmacotherapy Assessment  Analgesic: Tramadol 50 mg, 2 tabs p.o. twice daily (200 mg/day of tramadol) (20 MME)  MME/day: 20 mg/day   Monitoring: [place] PMP: PDMP reviewed during this encounter.       Pharmacotherapy: No side-effects or adverse reactions reported. Compliance: No problems identified. Effectiveness: Clinically acceptable.  Chauncey Fischer, RN  06/01/2021  9:41 AM  Sign when Signing Visit Nursing Pain  Medication Assessment:  Safety precautions to be maintained throughout the outpatient stay will include: orient to surroundings, keep bed in low position, maintain call bell within reach at all times, provide assistance with transfer out of bed and ambulation.  Medication Inspection Compliance: Pill count conducted under aseptic conditions, in front of the patient. Neither the pills nor the bottle was removed from the  patient's sight at any time. Once count was completed pills were immediately returned to the patient in their original bottle.  Medication: Tramadol (Ultram) Pill/Patch Count:  14 of 120 pills remain Pill/Patch Appearance: Markings consistent with prescribed medication Bottle Appearance: Standard pharmacy container. Clearly labeled. Filled Date: 68 / 13 / 2022 Last Medication intake:  TodaySafety precautions to be maintained throughout the outpatient stay will include: orient to surroundings, keep bed in low position, maintain call bell within reach at all times, provide assistance with transfer out of bed and ambulation.     UDS:  Summary  Date Value Ref Range Status  12/06/2020 Note  Final    Comment:    ==================================================================== Compliance Drug Analysis, Ur ==================================================================== Test                             Result       Flag       Units  Drug Present and Declared for Prescription Verification   Tramadol                       4360         EXPECTED   ng/mg creat   O-Desmethyltramadol            6535         EXPECTED   ng/mg creat   N-Desmethyltramadol            3486         EXPECTED   ng/mg creat    Source of tramadol is a prescription medication. O-desmethyltramadol    and N-desmethyltramadol are expected metabolites of tramadol.    Methocarbamol                  PRESENT      EXPECTED   Guaifenesin                    PRESENT      EXPECTED    Guaifenesin may be administered as an over-the-counter or    prescription drug; it may also be present as a breakdown product of    methocarbamol.    Amitriptyline                  PRESENT      EXPECTED   Nortriptyline                  PRESENT      EXPECTED    Nortriptyline is an expected metabolite of amitriptyline.    Naproxen                       PRESENT      EXPECTED   Metoprolol                     PRESENT      EXPECTED  Drug Absent but  Declared for Prescription Verification   Hydroxyzine  Not Detected UNEXPECTED ==================================================================== Test                      Result    Flag   Units      Ref Range   Creatinine              72               mg/dL      >=20 ==================================================================== Declared Medications:  The flagging and interpretation on this report are based on the  following declared medications.  Unexpected results may arise from  inaccuracies in the declared medications.   **Note: The testing scope of this panel includes these medications:   Amitriptyline  Guaifenesin  Hydroxyzine  Methocarbamol  Metoprolol  Naproxen  Tramadol   **Note: The testing scope of this panel does not include the  following reported medications:   Polyethylene Glycol (MiraLAX)  Sumatriptan ==================================================================== For clinical consultation, please call [phone]. ====================================================================      ROS  Constitutional: Denies any fever or chills Gastrointestinal: No reported hemesis, hematochezia, vomiting, or acute GI distress Musculoskeletal: Denies any acute onset joint swelling, redness, loss of ROM, or weakness Neurological: No reported episodes of acute onset apraxia, aphasia, dysarthria, agnosia, amnesia, paralysis, loss of coordination, or loss of consciousness  Medication Review  SUMAtriptan, Sodium Fluoride, amitriptyline, guaiFENesin, melatonin, methocarbamol, metoprolol succinate, polyethylene glycol, and traMADol  History Review  Allergy: Ms. Raben is allergic to ampicillin, cymbalta [duloxetine hcl], latex, lyrica [pregabalin], and zantac [ranitidine hcl]. Drug: Ms. Lilja  reports no history of drug use. Alcohol:  reports no history of alcohol use. Tobacco:  reports that she quit smoking about 22 years ago. Her  smoking use included cigarettes. She has a 12.00 pack-year smoking history. She has never used smokeless tobacco. Social: Ms. Pierron  reports that she quit smoking about 22 years ago. Her smoking use included cigarettes. She has a 12.00 pack-year smoking history. She has never used smokeless tobacco. She reports that she does not drink alcohol and does not use drugs. Medical:  has a past medical history of Chronic pain syndrome (12/06/2020), Fibromyalgia, Hypertension, and IBS (irritable bowel syndrome). Surgical: Ms. Oien  has no past surgical history on file. Family: family history includes Alcohol abuse in her brother; Atrial fibrillation in her father; Cancer in her father; Dementia in her maternal grandfather; Depression in her mother and sister; Diabetes in her maternal grandmother; Hypertension in her mother and sister; Macular degeneration in her maternal grandmother; Mental illness in her brother; Stroke in her mother.  Laboratory Chemistry Profile   Renal Lab Results  Component Value Date   BUN 14 04/20/2021   CREATININE 0.72 04/20/2021   BCR 22 12/06/2020   GFR 98.43 04/20/2021   GFRAA 92 03/22/2018   GFRNONAA 79 03/22/2018    Hepatic Lab Results  Component Value Date   AST 23 04/20/2021   ALT 22 04/20/2021   ALBUMIN 3.9 04/20/2021   ALKPHOS 43 04/20/2021    Electrolytes Lab Results  Component Value Date   NA 140 04/20/2021   K 4.0 04/20/2021   CL 104 04/20/2021   CALCIUM 8.6 04/20/2021   MG 2.2 12/06/2020    Bone Lab Results  Component Value Date   VD25OH 77 04/27/2020   25OHVITD1 51 12/06/2020   25OHVITD2 <1.0 12/06/2020   25OHVITD3 51 12/06/2020    Inflammation (CRP: Acute Phase) (ESR: Chronic Phase) Lab Results  Component Value Date   CRP <1 12/06/2020  ESRSEDRATE 9 12/06/2020         Note: Above Lab results reviewed.  Recent Imaging Review  CT THORACIC SPINE WO CONTRAST CLINICAL DATA:  Back pain, osteoarthritis  EXAM: CT THORACIC SPINE  WITHOUT CONTRAST  TECHNIQUE: Multidetector CT images of the thoracic were obtained using the standard protocol without intravenous contrast.  COMPARISON:  None.  FINDINGS: Alignment: Normal.  Vertebrae: No acute fracture or focal pathologic process.  Paraspinal and other soft tissues: Unremarkable.  Disc levels: Intervertebral disc spaces are preserved throughout the thoracic spine. Multilevel mild facet hypertrophy. No significant bony neural foraminal narrowing or spinal canal stenosis appreciated.  IMPRESSION: No acute osseous abnormality identified.  Electronically Signed   By: Jannifer Hick M.D.   On: 03/25/2021 08:57 Note: Reviewed        Physical Exam  General appearance: Well nourished, well developed, and well hydrated. In no apparent acute distress Mental status: Alert, oriented x 3 (person, place, & time)       Respiratory: No evidence of acute respiratory distress Eyes: PERLA Vitals: BP 101/73    Pulse 87    Temp (!) 97.3 F (36.3 C)    Resp 15    Ht 5\' 2"  (1.575 m)    Wt 136 lb (61.7 kg)    SpO2 100%    BMI 24.87 kg/m  BMI: Estimated body mass index is 24.87 kg/m as calculated from the following:   Height as of this encounter: 5\' 2"  (1.575 m).   Weight as of this encounter: 136 lb (61.7 kg). Ideal: Ideal body weight: 50.1 kg (110 lb 7.2 oz) Adjusted ideal body weight: 54.7 kg (120 lb 10.7 oz)  Assessment   Status Diagnosis  Controlled Controlled Controlled 1. Thoracic facet syndrome (Bilateral)   2. Thoracic facet hypertrophy   3. Chronic thoracic back pain (1ry area of Pain) (Bilateral) (L>R)   4. Chronic upper back pain (Bilateral)   5. Spondylosis without myelopathy or radiculopathy, thoracic region   6. Chronic pain syndrome   7. Pharmacologic therapy   8. Chronic use of opiate for therapeutic purpose   9. Encounter for medication management   10. Anxiety due to invasive procedure      Updated Problems: Problem  Thoracic facet  hypertrophy  Chronic Pain Syndrome  Chronic thoracic back pain (1ry area of Pain) (Bilateral) (L>R)    Plan of Care  Problem-specific:  No problem-specific Assessment & Plan notes found for this encounter.  Ms. SARAHLYNN CISNERO has a current medication list which includes the following long-term medication(s): amitriptyline, metoprolol succinate, sumatriptan, and [START ON 06/05/2021] tramadol.  Pharmacotherapy (Medications Ordered): Meds ordered this encounter  Medications   traMADol (ULTRAM) 50 MG tablet    Sig: Take 1 tablet (50 mg total) by mouth every 6 (six) hours as needed for severe pain. **Each refill must last 30 days.    Dispense:  120 tablet    Refill:  2    DO NOT: delete (not duplicate); no partial-fill (will deny script to complete), no refill request (F/U required). DISPENSE: 1 day early if closed on fill date. WARN: No CNS-depressants within 8 hrs of med.   Orders:  Orders Placed This Encounter  Procedures   THORACIC FACET BLOCK    Standing Status:   Future    Standing Expiration Date:   08/30/2021    Scheduling Instructions:     Thoracic Medial Branch Block     Side: Left-sided     Sedation: Patient's choice.  Timeframe: ASAA    Order Specific Question:   Where will this procedure be performed?    Answer:   [facility]   Follow-up plan:   Return for (Clinic) procedure: (L) T5-6 & T6-7 T-FCT MBB #1 (Diagnostic), (Sed-anx).     Interventional Therapies  Risk   Complexity Considerations:   Estimated body mass index is 24.51 kg/m as calculated from the following:   Height as of this encounter: $RemoveBeforeD'5\' 2"'LSyKnTgrvJiguY$  (1.575 m).   Weight as of this encounter: 134 lb (60.8 kg).   WNL   Planned   Pending:   Diagnostic left T5-6 & T6-7 thoracic facet MBB #1    Under consideration:   Diagnostic/therapeutic trigger point injections  Diagnostic bilateral thoracic facet block    Completed:   None at this time   Therapeutic   Palliative (PRN) options:   None  established    Recent Visits Date Type Provider Dept  03/07/21 Office Visit Milinda Pointer, MD [facility]  Showing recent visits within past 90 days and meeting all other requirements Today's Visits Date Type Provider Dept  06/01/21 Office Visit Milinda Pointer, MD [facility]  Showing today's visits and meeting all other requirements Future Appointments Date Type Provider Dept  08/24/21 Appointment Milinda Pointer, MD [facility]  Showing future appointments within next 90 days and meeting all other requirements  I discussed the assessment and treatment plan with the patient. The patient was provided an opportunity to ask questions and all were answered. The patient agreed with the plan and demonstrated an understanding of the instructions.  Patient advised to call back or seek an in-person evaluation if the symptoms or condition worsens.  Duration of encounter: 45 minutes.  Note by: Gaspar Cola, MD Date: 06/01/2021; Time: 2:17 PM

## 2021-06-01 ENCOUNTER — Encounter: Payer: Self-pay | Admitting: Pain Medicine

## 2021-06-01 ENCOUNTER — Other Ambulatory Visit (HOSPITAL_COMMUNITY): Payer: Self-pay

## 2021-06-01 ENCOUNTER — Ambulatory Visit: Payer: No Typology Code available for payment source | Attending: Pain Medicine | Admitting: Pain Medicine

## 2021-06-01 ENCOUNTER — Other Ambulatory Visit: Payer: Self-pay

## 2021-06-01 DIAGNOSIS — G8929 Other chronic pain: Secondary | ICD-10-CM

## 2021-06-01 DIAGNOSIS — M549 Dorsalgia, unspecified: Secondary | ICD-10-CM | POA: Diagnosis not present

## 2021-06-01 DIAGNOSIS — M47814 Spondylosis without myelopathy or radiculopathy, thoracic region: Secondary | ICD-10-CM | POA: Diagnosis not present

## 2021-06-01 DIAGNOSIS — M47819 Spondylosis without myelopathy or radiculopathy, site unspecified: Secondary | ICD-10-CM | POA: Diagnosis not present

## 2021-06-01 DIAGNOSIS — Z79891 Long term (current) use of opiate analgesic: Secondary | ICD-10-CM

## 2021-06-01 DIAGNOSIS — F419 Anxiety disorder, unspecified: Secondary | ICD-10-CM

## 2021-06-01 DIAGNOSIS — G894 Chronic pain syndrome: Secondary | ICD-10-CM

## 2021-06-01 DIAGNOSIS — M7918 Myalgia, other site: Secondary | ICD-10-CM

## 2021-06-01 DIAGNOSIS — M47894 Other spondylosis, thoracic region: Secondary | ICD-10-CM | POA: Diagnosis not present

## 2021-06-01 DIAGNOSIS — M546 Pain in thoracic spine: Secondary | ICD-10-CM

## 2021-06-01 DIAGNOSIS — Z79899 Other long term (current) drug therapy: Secondary | ICD-10-CM

## 2021-06-01 MED ORDER — TRAMADOL HCL 50 MG PO TABS
50.0000 mg | ORAL_TABLET | Freq: Four times a day (QID) | ORAL | 2 refills | Status: DC | PRN
  Filled 2021-06-01 (×2): qty 120, 30d supply, fill #0
  Filled 2021-07-04: qty 120, 30d supply, fill #1
  Filled 2021-08-01: qty 120, 30d supply, fill #2

## 2021-06-01 NOTE — Progress Notes (Signed)
Nursing Pain Medication Assessment:  Safety precautions to be maintained throughout the outpatient stay will include: orient to surroundings, keep bed in low position, maintain call bell within reach at all times, provide assistance with transfer out of bed and ambulation.  Medication Inspection Compliance: Pill count conducted under aseptic conditions, in front of the patient. Neither the pills nor the bottle was removed from the patient's sight at any time. Once count was completed pills were immediately returned to the patient in their original bottle.  Medication: Tramadol (Ultram) Pill/Patch Count:  14 of 120 pills remain Pill/Patch Appearance: Markings consistent with prescribed medication Bottle Appearance: Standard pharmacy container. Clearly labeled. Filled Date: 80 / 13 / 2022 Last Medication intake:  TodaySafety precautions to be maintained throughout the outpatient stay will include: orient to surroundings, keep bed in low position, maintain call bell within reach at all times, provide assistance with transfer out of bed and ambulation.

## 2021-06-01 NOTE — Patient Instructions (Signed)
______________________________________________________________________ ° °Preparing for Procedure with Sedation ° °NOTICE: Due to recent regulatory changes, starting on December 20, 2020, procedures requiring intravenous (IV) sedation will no longer be performed at the [facility].  These types of procedures are required to be performed at [facility] ambulatory surgery facility.  We are very sorry for the inconvenience. ° °Procedure appointments are limited to planned procedures: °No Prescription Refills. °No disability issues will be discussed. °No medication changes will be discussed. ° °Instructions: °Oral Intake: Do not eat or drink anything for at least 8 hours prior to your procedure. (Exception: Blood Pressure Medication. See below.) °Transportation: A driver is required. You may not drive yourself after the procedure. °Blood Pressure Medicine: Do not forget to take your blood pressure medicine with a sip of water the morning of the procedure. If your Diastolic (lower reading) is above 100 mmHg, elective cases will be cancelled/rescheduled. °Blood thinners: These will need to be stopped for procedures. Notify our staff if you are taking any blood thinners. Depending on which one you take, there will be specific instructions on how and when to stop it. °Diabetics on insulin: Notify the staff so that you can be scheduled 1st case in the morning. If your diabetes requires high dose insulin, take only ½ of your normal insulin dose the morning of the procedure and notify the staff that you have done so. °Preventing infections: Shower with an antibacterial soap the morning of your procedure. °Build-up your immune system: Take 1000 mg of Vitamin C with every meal (3 times a day) the day prior to your procedure. °Antibiotics: Inform the staff if you have a condition or reason that requires you to take antibiotics before dental procedures. °Pregnancy: If you are pregnant, call and cancel the procedure. °Sickness: If  you have a cold, fever, or any active infections, call and cancel the procedure. °Arrival: You must be in the facility at least 30 minutes prior to your scheduled procedure. °Children: Do not bring children with you. °Dress appropriately: Bring dark clothing that you would not mind if they get stained. °Valuables: Do not bring any jewelry or valuables. ° °Reasons to call and reschedule or cancel your procedure: (Following these recommendations will minimize the risk of a serious complication.) °Surgeries: Avoid having procedures within 2 weeks of any surgery. (Avoid for 2 weeks before or after any surgery). °Flu Shots: Avoid having procedures within 2 weeks of a flu shots. (Avoid for 2 weeks before or after immunizations). °Barium: Avoid having a procedure within 7-10 days after having had a radiological study involving the use of radiological contrast. (Myelograms, Barium swallow or enema study). °Heart attacks: Avoid any elective procedures or surgeries for the initial 6 months after a "Myocardial Infarction" (Heart Attack). °Blood thinners: It is imperative that you stop these medications before procedures. Let us know if you if you take any blood thinner.  °Infection: Avoid procedures during or within two weeks of an infection (including chest colds or gastrointestinal problems). Symptoms associated with infections include: Localized redness, fever, chills, night sweats or profuse sweating, burning sensation when voiding, cough, congestion, stuffiness, runny nose, sore throat, diarrhea, nausea, vomiting, cold or Flu symptoms, recent or current infections. It is specially important if the infection is over the area that we intend to treat. °Heart and lung problems: Symptoms that may suggest an active cardiopulmonary problem include: cough, chest pain, breathing difficulties or shortness of breath, dizziness, ankle swelling, uncontrolled high or unusually low blood pressure, and/or palpitations. If you are    experiencing any of these symptoms, cancel your procedure and contact your primary care physician for an evaluation.  Remember:  Regular Business hours are:  Monday to Thursday 8:00 AM to 4:00 PM  Provider's Schedule: Milinda Pointer, MD:  Procedure days: Tuesday and Thursday 7:30 AM to 4:00 PM  Gillis Santa, MD:  Procedure days: Monday and Wednesday 7:30 AM to 4:00 PM ______________________________________________________________________  Facet Joint Block The facet joints connect the bones of the spine (vertebrae). They make it possible for you to bend, twist, and make other movements with your spine. They also keep you from bending too far, twisting too far, and making other extreme movements. A facet joint block is a procedure in which a numbing medicine (anesthetic) is injected into a facet joint. In many cases, an anti-inflammatory medicine (steroid) is also injected. A facet joint block may be done: To diagnose neck or back pain. If the pain gets better after a facet joint block, it means the pain is probably coming from the facet joint. If the pain does not get better, it means the pain is probably not coming from the facet joint. To relieve neck or back pain that is caused by an inflamed facet joint. A facet joint block is only done to relieve pain if the pain does not improve with other methods, such as medicine, exercise programs, and physical therapy. Tell a health care provider about: Any allergies you have. All medicines you are taking, including vitamins, herbs, eye drops, creams, and over-the-counter medicines. Any problems you or family members have had with anesthetic medicines. Any blood disorders you have. Any surgeries you have had. Any medical conditions you have or have had. Whether you are pregnant or may be pregnant. What are the risks? Generally, this is a safe procedure. However, problems may occur, including: Bleeding. Injury to a nerve near the injection  site. Pain at the injection site. Weakness or numbness in areas controlled by nerves near the injection site. Infection. Temporary fluid retention. Allergic reactions to medicines or dyes. Injury to other structures or organs near the injection site. What happens before the procedure? Medicines Ask your health care provider about: Changing or stopping your regular medicines. This is especially important if you are taking diabetes medicines or blood thinners. Taking medicines such as aspirin and ibuprofen. These medicines can thin your blood. Do not take these medicines unless your health care provider tells you to take them. Taking over-the-counter medicines, vitamins, herbs, and supplements. Eating and drinking Follow instructions from your health care provider about eating and drinking, which may include: 8 hours before the procedure - stop eating heavy meals or foods, such as meat, fried foods, or fatty foods. 6 hours before the procedure - stop eating light meals or foods, such as toast or cereal. 6 hours before the procedure - stop drinking milk or drinks that contain milk. 2 hours before the procedure - stop drinking clear liquids. Staying hydrated Follow instructions from your health care provider about hydration, which may include: Up to 2 hours before the procedure - you may continue to drink clear liquids, such as water, clear fruit juice, black coffee, and plain tea. General instructions Do not use any products that contain nicotine or tobacco for at least 4-6 weeks before the procedure. These products include cigarettes, e-cigarettes, and chewing tobacco. If you need help quitting, ask your health care provider. Plan to have someone take you home from the hospital or clinic. Ask your health care provider: How your surgery  site will be marked. What steps will be taken to help prevent infection. These may include: Removing hair at the surgery site. Washing skin with a  germ-killing soap. Receiving antibiotic medicine. What happens during the procedure?  You will put on a hospital gown. You will lie on your stomach on an X-ray table. You may be asked to lie in a different position if an injection will be made in your neck. Machines will be used to monitor your oxygen levels, heart rate, and blood pressure. Your skin will be cleaned. If an injection will be made in your neck, an IV will be inserted into one of your veins. Fluids and medicine will flow directly into your body through the IV. A numbing medicine (local anesthetic) will be applied to your skin. Your skin may sting or burn for a moment. A video X-ray machine (fluoroscopy) will be used to find the joint. In some cases, a CT scan may be used. A contrast dye may be injected into the facet joint area to help find the joint. When the joint is located, an anesthetic will be injected into the joint through the needle. Your health care provider will ask you whether you feel pain relief. If you feel relief, a steroid may be injected to provide pain relief for a longer period of time. If you do not feel relief or feel only partial relief, additional injections of an anesthetic may be made in other facet joints. The needle will be removed. Your skin will be cleaned. A bandage (dressing) will be applied over each injection site. The procedure may vary among health care providers and hospitals. What happens after the procedure? Your blood pressure, heart rate, breathing rate, and blood oxygen level will be monitored until you leave the hospital or clinic. You will lie down and rest for a period of time. Summary A facet joint block is a procedure in which a numbing medicine (anesthetic) is injected into a facet joint. An anti-inflammatory medicine (stereoid) may also be injected. Follow instructions from your health care provider about medicines and eating and drinking before the procedure. Do not use any  products that contain nicotine or tobacco for at least 4-6 weeks before the procedure. You will lie on your stomach for the procedure, but you may be asked to lie in a different position if an injection will be made in your neck. When the joint is located, an anesthetic will be injected into the joint through the needle. This information is not intended to replace advice given to you by your health care provider. Make sure you discuss any questions you have with your health care provider. Document Revised: 08/29/2018 Document Reviewed: 04/12/2018 Elsevier Patient Education  2022 [employer].

## 2021-06-06 ENCOUNTER — Encounter: Payer: Self-pay | Admitting: Family

## 2021-06-07 ENCOUNTER — Other Ambulatory Visit: Payer: Self-pay

## 2021-06-07 DIAGNOSIS — Z0100 Encounter for examination of eyes and vision without abnormal findings: Secondary | ICD-10-CM

## 2021-06-13 ENCOUNTER — Other Ambulatory Visit: Payer: Self-pay | Admitting: Pain Medicine

## 2021-06-13 NOTE — Progress Notes (Addendum)
02:15 PM 06/13/2021. - Dr. Marquita Palms" called at [phone]. No one answered. Transferred to voicemail. Message left to call me back at [phone]. I assume this is for peer-to-peer review. Dossie Arbour, MD

## 2021-06-14 ENCOUNTER — Other Ambulatory Visit: Payer: Self-pay | Admitting: Pain Medicine

## 2021-06-14 NOTE — Progress Notes (Signed)
(  2:10 PM 06/14/2021) - Apparently Dr. Marquita Palms called earlier today at about 9:30 AM.  I received a message from my staff that 9:47 AM.  The message indicated that he wanted to be called back at [phone].  As soon as I have an opportunity for my interventional schedule, I called him back, but again the call went directly to a voicemail.  Once again, I left a message identifying myself and the patient and indicating that I would be expecting his call.  I am assuming him that this is for a peer-to-peer review.  The problem with this is that I cannot stop everything that I am doing and ignore the patient is that have come in to be treated, just so that I can talk to somebody on the phone.  As I said before, I will be available to do a peer to peer review Monday through Thursday from 2 PM to 4 PM. Dossie Arbour, MD.

## 2021-06-15 ENCOUNTER — Other Ambulatory Visit: Payer: Self-pay | Admitting: Nurse Practitioner

## 2021-06-15 ENCOUNTER — Other Ambulatory Visit: Payer: Self-pay | Admitting: Pain Medicine

## 2021-06-15 ENCOUNTER — Other Ambulatory Visit (HOSPITAL_COMMUNITY): Payer: Self-pay

## 2021-06-15 ENCOUNTER — Encounter: Payer: Self-pay | Admitting: Family

## 2021-06-15 MED ORDER — AMITRIPTYLINE HCL 50 MG PO TABS
ORAL_TABLET | Freq: Every day | ORAL | 1 refills | Status: DC
  Filled 2021-06-15: qty 90, 90d supply, fill #0

## 2021-06-20 ENCOUNTER — Telehealth: Payer: No Typology Code available for payment source | Admitting: Emergency Medicine

## 2021-06-20 NOTE — Progress Notes (Signed)
[place]. I explained we cannot treat outside of [place] [facility] and directed her to [facility] anytime or a local in person urgent care

## 2021-07-04 ENCOUNTER — Other Ambulatory Visit (HOSPITAL_COMMUNITY): Payer: Self-pay

## 2021-07-06 ENCOUNTER — Other Ambulatory Visit: Payer: Self-pay | Admitting: Family

## 2021-07-06 DIAGNOSIS — G8929 Other chronic pain: Secondary | ICD-10-CM

## 2021-07-06 DIAGNOSIS — M549 Dorsalgia, unspecified: Secondary | ICD-10-CM

## 2021-07-06 NOTE — Telephone Encounter (Signed)
yes, ok to refill - needs office visit every 6 mos.  thanks

## 2021-07-08 ENCOUNTER — Other Ambulatory Visit (HOSPITAL_COMMUNITY): Payer: Self-pay

## 2021-07-08 ENCOUNTER — Other Ambulatory Visit: Payer: Self-pay

## 2021-07-08 MED ORDER — METHOCARBAMOL 750 MG PO TABS
750.0000 mg | ORAL_TABLET | Freq: Three times a day (TID) | ORAL | 1 refills | Status: DC | PRN
  Filled 2021-07-08: qty 90, 30d supply, fill #0
  Filled 2021-08-25: qty 90, 30d supply, fill #1

## 2021-07-14 ENCOUNTER — Encounter: Payer: Self-pay | Admitting: Family

## 2021-07-14 ENCOUNTER — Other Ambulatory Visit (HOSPITAL_COMMUNITY): Payer: Self-pay

## 2021-08-01 ENCOUNTER — Other Ambulatory Visit (HOSPITAL_COMMUNITY): Payer: Self-pay

## 2021-08-24 ENCOUNTER — Encounter: Payer: No Typology Code available for payment source | Admitting: Pain Medicine

## 2021-08-24 ENCOUNTER — Encounter: Payer: Self-pay | Admitting: Family

## 2021-08-24 ENCOUNTER — Ambulatory Visit (INDEPENDENT_AMBULATORY_CARE_PROVIDER_SITE_OTHER): Payer: No Typology Code available for payment source | Admitting: Family

## 2021-08-24 VITALS — BP 107/75 | HR 90 | Temp 98.4°F | Ht 62.0 in | Wt 140.4 lb

## 2021-08-24 DIAGNOSIS — M47894 Other spondylosis, thoracic region: Secondary | ICD-10-CM

## 2021-08-24 DIAGNOSIS — G894 Chronic pain syndrome: Secondary | ICD-10-CM

## 2021-08-24 DIAGNOSIS — R5382 Chronic fatigue, unspecified: Secondary | ICD-10-CM | POA: Diagnosis not present

## 2021-08-24 DIAGNOSIS — Z79891 Long term (current) use of opiate analgesic: Secondary | ICD-10-CM

## 2021-08-24 DIAGNOSIS — F419 Anxiety disorder, unspecified: Secondary | ICD-10-CM

## 2021-08-24 DIAGNOSIS — M797 Fibromyalgia: Secondary | ICD-10-CM

## 2021-08-24 DIAGNOSIS — M47814 Spondylosis without myelopathy or radiculopathy, thoracic region: Secondary | ICD-10-CM

## 2021-08-24 DIAGNOSIS — Z79899 Other long term (current) drug therapy: Secondary | ICD-10-CM

## 2021-08-24 DIAGNOSIS — G8929 Other chronic pain: Secondary | ICD-10-CM

## 2021-08-24 LAB — SEDIMENTATION RATE: Sed Rate: 9 mm/hr (ref 0–20)

## 2021-08-24 LAB — C-REACTIVE PROTEIN: CRP: <1 mg/dL (ref 0.5–20.0)

## 2021-08-24 NOTE — Patient Instructions (Addendum)
It was very nice to see you today! ? ?Go to the lab for blood work today. ?Will see results, but think referring to Rheumatology is a good idea based on your symptoms. ?Prefer Rheumatology's expertise regarding a stimulant medication for your fatigue in relation to your fibromyalgia. ?Let me know if you want to try low dose Gabapentin for neuropathy type pain. ?I will look into switching to Diltiazem from Metoprolol to help with Raynaud's syndrome. ? ?  ? ?PLEASE NOTE: ? ?If you had any lab tests please let us know if you have not heard back within a few days. You may see your results on MyChart before we have a chance to review them but we will give you a call once they are reviewed by [place]. If we ordered any referrals today, please let us know if you have not heard from their office within the next week.  ? ?Please try these tips to maintain a healthy lifestyle: ? ?Eat most of your calories during the day when you are active. Eliminate processed foods including packaged sweets (pies, cakes, cookies), reduce intake of potatoes, white bread, white pasta, and white rice. Look for whole grain options, oat flour or almond flour. ? ?Each meal should contain half fruits/vegetables, one quarter protein, and one quarter carbs (no bigger than a computer mouse). ? ?Cut down on sweet beverages. This includes juice, soda, and sweet tea. Also watch fruit intake, though this is a healthier sweet option, it still contains natural sugar! Limit to 3 servings daily. ? ?Drink at least 1 glass of water with each meal and aim for at least 8 glasses per day ? ?Exercise at least 150 minutes every week.  ? ?

## 2021-08-24 NOTE — Progress Notes (Signed)
? ?Subjective:  ? ? ? Patient ID: Karen Schwartz, female    DOB: Apr 04, 1971, 51 y.o.   MRN: [id number] ? ?Chief Complaint  ?Patient presents with  ? Numbness  ?  Raynaud's Syndrome, Discuss medications.   ? Fatigue  ? ?HPI: ?Fatigue: Pt. reports onset: months ago. Hx of fatigue: yes, off & on, more persistent now. ?Occurs upon awakening:  yes; during the day; evenings. Hours of sleep: 6. Works during: day. New medications or change in current med doses: no. Recent labs: yes- CPE labs wnl. Exercise: yes. High carb/sugar diet: no. Sx of depression: no  ?Pain: She reports chronic back pain. was not an injury that may have caused the pain. The pain started years ago and is staying constant. The pain does not radiate. The pain is described as aching, burning, tingling, soreness, stiffness, and throbbing, occurring every day. Symptoms are worse in the: all day & night.  ?Aggravating factors: sitting, standing, walking, and lifting, pulling. Relieving factors: medication Tramadol, muscle relaxers prn and rest.  ?She has tried application of heat, application of ice, acetaminophen, NSAIDs, topical anesthetics, physical thearpy, and message therapy with little relief, reports she does get relief with therapeutic massage, but does not get more often d/t expense.  ? ?Health Maintenance Due  ?Topic Date Due  ? COLONOSCOPY (Pts 45-40yr Insurance coverage will need to be confirmed)  Never done  ? PAP SMEAR-Modifier  11/06/2020  ? MAMMOGRAM  07/08/2021  ? ? ?Past Medical History:  ?Diagnosis Date  ? Bloating 02/11/2018  ? Chronic pain syndrome 12/06/2020  ? Chronic pain syndrome 12/06/2020  ? Disorder of skeletal system 12/06/2020  ? Fibromyalgia   ? Hypertension   ? boderline  ? IBS (irritable bowel syndrome)   ? Pharmacologic therapy 12/06/2020  ? Problems influencing health status 12/06/2020  ? ? ?History reviewed. No pertinent surgical history. ? ?Outpatient Medications Prior to Visit  ?Medication Sig Dispense Refill  ? guaiFENesin  (MUCINEX) 600 MG 12 hr tablet Take 600 mg by mouth 2 (two) times daily. PRN - pt takes for fibromyalgia sx, off label    ? melatonin 5 MG TABS Take 5 mg by mouth at bedtime.    ? methocarbamol (ROBAXIN) 750 MG tablet Take 1 tablet by mouth every 8 (eight) hours as needed for muscle spasms. 90 tablet 1  ? metoprolol succinate (TOPROL-XL) 25 MG 24 hr tablet TAKE 1/2 TABLET BY MOUTH EVERY MORNING AND 1 TABLET BY MOUTH EVERY EVENING (Patient taking differently: Take 1/2 every evening and 1 tablet every morning.) 135 tablet 3  ? polyethylene glycol (MIRALAX / GLYCOLAX) packet Take 17 g by mouth daily.    ? SUMAtriptan (IMITREX) 100 MG tablet TAKE 1 TABLET BY MOUTH EVERY 2 HOURS AS NEEDED AS DIRECTED 9 tablet 3  ? amitriptyline (ELAVIL) 50 MG tablet TAKE 1 TABLET BY MOUTH AT BEDTIME. 90 tablet 1  ? traMADol (ULTRAM) 50 MG tablet Take 1 tablet (50 mg total) by mouth every 6 (six) hours as needed for severe pain. **Each refill must last 30 days. 120 tablet 2  ? ?No facility-administered medications prior to visit.  ? ? ?Allergies  ?Allergen Reactions  ? Ampicillin   ? Cymbalta [Duloxetine Hcl]   ? Latex Itching  ? Lyrica [Pregabalin]   ? Zantac [Ranitidine Hcl]   ?  Tachycardia  ? ? ?   ?Objective:  ?  ?Physical Exam ?Vitals and nursing note reviewed.  ?Constitutional:   ?   Appearance: Normal appearance.  ?  Cardiovascular:  ?   Rate and Rhythm: Normal rate and regular rhythm.  ?Pulmonary:  ?   Effort: Pulmonary effort is normal.  ?   Breath sounds: Normal breath sounds.  ?Musculoskeletal:     ?   General: Normal range of motion.  ?Skin: ?   General: Skin is warm and dry.  ?Neurological:  ?   Mental Status: She is alert.  ?Psychiatric:     ?   Mood and Affect: Mood normal.     ?   Behavior: Behavior normal.  ? ? ?BP 107/75 (BP Location: Left Arm, Patient Position: Sitting, Cuff Size: Normal)   Pulse 90   Temp 98.4 ?F (36.9 ?C) (Temporal)   Ht '5\' 2"'$  (1.575 m)   Wt 140 lb 6 oz (63.7 kg)   LMP 08/22/2021 (Approximate)    SpO2 100%   BMI 25.67 kg/m?  ?Wt Readings from Last 3 Encounters:  ?08/24/21 140 lb 6 oz (63.7 kg)  ?06/01/21 136 lb (61.7 kg)  ?04/20/21 136 lb 4 oz (61.8 kg)  ? ? ?   ?Assessment & Plan:  ? ?Problem List Items Addressed This Visit   ? ?  ? Other  ? Fibromyalgia (Chronic)  ? Relevant Medications  ? traMADol (ULTRAM) 50 MG tablet  ? amitriptyline (ELAVIL) 50 MG tablet  ? Other Relevant Orders  ? Autoimmune Profile (Completed)  ? C-reactive protein (Completed)  ? Rheumatoid Arthritis Profile (Completed)  ? Sedimentation rate (Completed)  ? Sjogren's syndrome antibods(ssa + ssb) (Completed)  ? Chronic fatigue - Primary  ?  pt reports having autoimmune labs run years ago but came up negative, will run again d/t worsening sx. ?  ?  ? Relevant Orders  ? Autoimmune Profile (Completed)  ? C-reactive protein (Completed)  ? Rheumatoid Arthritis Profile (Completed)  ? Sedimentation rate (Completed)  ? Sjogren's syndrome antibods(ssa + ssb) (Completed)  ? RESOLVED: Chronic pain syndrome (Chronic)  ? Relevant Medications  ? traMADol (ULTRAM) 50 MG tablet  ? amitriptyline (ELAVIL) 50 MG tablet  ? ? ?Meds ordered this encounter  ?Medications  ? traMADol (ULTRAM) 50 MG tablet  ?  Sig: Take 1 tablet (50 mg total) by mouth every 6 (six) hours as needed for severe pain. **Each refill must last 30 days.  ?  Dispense:  120 tablet  ?  Refill:  2  ?  DO NOT: delete (not duplicate); no partial-fill (will deny script to complete), no refill request (F/U required). DISPENSE: 1 day early if closed on fill date. WARN: No CNS-depressants within 8 hrs of med.  ?  Order Specific Question:   Supervising Provider  ?  Answer:   ANDY, CAMILLE L [[id number]]  ? amitriptyline (ELAVIL) 50 MG tablet  ?  Sig: Take 1.5 tablets (75 mg total) by mouth at bedtime.  ?  Dispense:  135 tablet  ?  Refill:  0  ?  Needs new pcp  ?  Order Specific Question:   Supervising Provider  ?  Answer:   ANDY, CAMILLE L [[id number]]  ? ? ?Jeanie Sewer, NP ? ?

## 2021-08-25 ENCOUNTER — Other Ambulatory Visit (HOSPITAL_COMMUNITY): Payer: Self-pay

## 2021-08-25 LAB — SJOGREN'S SYNDROME ANTIBODS(SSA + SSB)
SSA (Ro) (ENA) Antibody, IgG: <1 AI
SSB (La) (ENA) Antibody, IgG: <1 AI

## 2021-08-26 ENCOUNTER — Encounter: Payer: Self-pay | Admitting: Family

## 2021-08-26 ENCOUNTER — Other Ambulatory Visit (HOSPITAL_COMMUNITY): Payer: Self-pay

## 2021-08-26 DIAGNOSIS — R5382 Chronic fatigue, unspecified: Secondary | ICD-10-CM | POA: Insufficient documentation

## 2021-08-26 HISTORY — DX: Chronic fatigue, unspecified: R53.82

## 2021-08-26 LAB — RHEUMATOID ARTHRITIS PROFILE
Cyclic Citrullin Peptide Ab: 2 units (ref 0–19)
Rheumatoid fact SerPl-aCnc: <10 IU/mL (ref <14.0)

## 2021-08-26 LAB — AUTOIMMUNE PROFILE
Anti Nuclear Antibody (ANA): NEGATIVE
Complement C3, Serum: 118 mg/dL (ref 82–167)
dsDNA Ab: <1 IU/mL (ref 0–9)

## 2021-08-26 MED ORDER — AMITRIPTYLINE HCL 50 MG PO TABS: 75.0000 mg | ORAL_TABLET | Freq: Every day | ORAL | 0 refills | Status: DC

## 2021-08-26 MED ORDER — TRAMADOL HCL 50 MG PO TABS
50.0000 mg | ORAL_TABLET | Freq: Four times a day (QID) | ORAL | 2 refills | Status: DC | PRN
  Filled 2021-08-26 – 2021-08-31 (×2): qty 120, 30d supply, fill #0
  Filled 2021-10-05: qty 120, 30d supply, fill #1
  Filled 2021-11-03 – 2021-11-04 (×2): qty 120, 30d supply, fill #2

## 2021-08-26 NOTE — Assessment & Plan Note (Signed)
pt reports having autoimmune labs run years ago but came up negative, will run again d/t worsening sx. ?

## 2021-08-28 ENCOUNTER — Other Ambulatory Visit: Payer: Self-pay | Admitting: Family

## 2021-08-28 MED ORDER — METOPROLOL SUCCINATE ER 25 MG PO TB24: ORAL_TABLET | ORAL | 3 refills | Status: DC

## 2021-08-28 NOTE — Progress Notes (Signed)
Hi Karen Schwartz, ? ?You can see that all of your autoimmune testing has come up negative, and your CRP and sed rate are normal, indicating no inflammation in the body. This is not an all-inclusive testing, so still recommend a Rheumatology consult. ? ?In regards to your Raynauds symptoms, if these have been more bothersome, you can start a low dose of Procardia which is a calcium channel blocker. I would wean down your Metoprolol to 1/2 pill twice a day for 1 week, then stay on 1/2 pill daily as long as your heart rate is under 100. Weaning the Metoprolol is to reduce the raynauds symptoms but also to be sure your blood pressure does not drop too low with adding the Procardia. As long as your heart rate is under control & BP is normal with the Procardia, then hopefully we can discontinue the Metoprolol. ? ?The other thing to consider is as the weather warms up, your Raynaud symptoms may improve. As you know, symptoms are mainly brought on by cold exposure. ? ?Let me know what you would like to do.

## 2021-08-31 ENCOUNTER — Other Ambulatory Visit (HOSPITAL_COMMUNITY): Payer: Self-pay

## 2021-09-01 ENCOUNTER — Other Ambulatory Visit (HOSPITAL_COMMUNITY): Payer: Self-pay

## 2021-09-03 ENCOUNTER — Encounter: Payer: Self-pay | Admitting: Family

## 2021-09-07 ENCOUNTER — Other Ambulatory Visit: Payer: Self-pay

## 2021-09-07 DIAGNOSIS — G894 Chronic pain syndrome: Secondary | ICD-10-CM

## 2021-09-07 DIAGNOSIS — M797 Fibromyalgia: Secondary | ICD-10-CM

## 2021-09-07 MED ORDER — AMITRIPTYLINE HCL 75 MG PO TABS: 75.0000 mg | ORAL_TABLET | Freq: Every day | ORAL | 1 refills | Status: DC

## 2021-09-09 ENCOUNTER — Other Ambulatory Visit (HOSPITAL_COMMUNITY): Payer: Self-pay

## 2021-09-12 ENCOUNTER — Other Ambulatory Visit (HOSPITAL_COMMUNITY): Payer: Self-pay

## 2021-09-12 ENCOUNTER — Other Ambulatory Visit: Payer: Self-pay | Admitting: Family Medicine

## 2021-09-12 ENCOUNTER — Other Ambulatory Visit: Payer: Self-pay

## 2021-09-12 DIAGNOSIS — M797 Fibromyalgia: Secondary | ICD-10-CM

## 2021-09-12 DIAGNOSIS — G894 Chronic pain syndrome: Secondary | ICD-10-CM

## 2021-09-12 MED ORDER — AMITRIPTYLINE HCL 75 MG PO TABS
75.0000 mg | ORAL_TABLET | Freq: Every day | ORAL | 1 refills | Status: DC
  Filled 2021-09-12: qty 90, 90d supply, fill #0
  Filled 2021-12-08: qty 90, 90d supply, fill #1

## 2021-10-05 ENCOUNTER — Other Ambulatory Visit: Payer: Self-pay | Admitting: Family

## 2021-10-05 ENCOUNTER — Other Ambulatory Visit (HOSPITAL_COMMUNITY): Payer: Self-pay

## 2021-10-05 DIAGNOSIS — G8929 Other chronic pain: Secondary | ICD-10-CM

## 2021-10-05 MED ORDER — METHOCARBAMOL 750 MG PO TABS
750.0000 mg | ORAL_TABLET | Freq: Three times a day (TID) | ORAL | 1 refills | Status: DC | PRN
  Filled 2021-10-05: qty 90, 30d supply, fill #0
  Filled 2021-11-23: qty 90, 30d supply, fill #1

## 2021-10-14 ENCOUNTER — Other Ambulatory Visit (HOSPITAL_COMMUNITY): Payer: Self-pay

## 2021-10-14 ENCOUNTER — Encounter: Payer: Self-pay | Admitting: Family

## 2021-10-14 ENCOUNTER — Other Ambulatory Visit: Payer: Self-pay | Admitting: Family

## 2021-10-14 ENCOUNTER — Other Ambulatory Visit: Payer: Self-pay

## 2021-10-14 DIAGNOSIS — R5383 Other fatigue: Secondary | ICD-10-CM

## 2021-10-14 DIAGNOSIS — M797 Fibromyalgia: Secondary | ICD-10-CM

## 2021-10-14 MED ORDER — METOPROLOL SUCCINATE ER 25 MG PO TB24
ORAL_TABLET | ORAL | 3 refills | Status: DC
  Filled 2021-10-14: qty 135, 90d supply, fill #0
  Filled 2022-01-11: qty 135, 90d supply, fill #1

## 2021-10-14 NOTE — Telephone Encounter (Signed)
yes, thanks

## 2021-11-03 ENCOUNTER — Other Ambulatory Visit (HOSPITAL_COMMUNITY): Payer: Self-pay

## 2021-11-04 ENCOUNTER — Other Ambulatory Visit (HOSPITAL_COMMUNITY): Payer: Self-pay

## 2021-11-24 ENCOUNTER — Other Ambulatory Visit (HOSPITAL_COMMUNITY): Payer: Self-pay

## 2021-12-06 ENCOUNTER — Encounter: Payer: Self-pay | Admitting: Family

## 2021-12-06 ENCOUNTER — Other Ambulatory Visit: Payer: Self-pay | Admitting: Family

## 2021-12-06 DIAGNOSIS — M797 Fibromyalgia: Secondary | ICD-10-CM

## 2021-12-06 DIAGNOSIS — G894 Chronic pain syndrome: Secondary | ICD-10-CM

## 2021-12-07 ENCOUNTER — Other Ambulatory Visit (HOSPITAL_COMMUNITY): Payer: Self-pay

## 2021-12-08 ENCOUNTER — Encounter: Payer: Self-pay | Admitting: Family

## 2021-12-08 ENCOUNTER — Other Ambulatory Visit (HOSPITAL_COMMUNITY): Payer: Self-pay

## 2021-12-08 ENCOUNTER — Ambulatory Visit (INDEPENDENT_AMBULATORY_CARE_PROVIDER_SITE_OTHER): Payer: No Typology Code available for payment source | Admitting: Family

## 2021-12-08 VITALS — BP 120/83 | HR 96 | Temp 98.3°F | Ht 62.0 in | Wt 141.0 lb

## 2021-12-08 DIAGNOSIS — M47819 Spondylosis without myelopathy or radiculopathy, site unspecified: Secondary | ICD-10-CM | POA: Diagnosis not present

## 2021-12-08 DIAGNOSIS — M7918 Myalgia, other site: Secondary | ICD-10-CM

## 2021-12-08 DIAGNOSIS — M797 Fibromyalgia: Secondary | ICD-10-CM | POA: Diagnosis not present

## 2021-12-08 DIAGNOSIS — M47814 Spondylosis without myelopathy or radiculopathy, thoracic region: Secondary | ICD-10-CM | POA: Diagnosis not present

## 2021-12-08 DIAGNOSIS — Z1211 Encounter for screening for malignant neoplasm of colon: Secondary | ICD-10-CM

## 2021-12-08 MED ORDER — TRAMADOL HCL 50 MG PO TABS
50.0000 mg | ORAL_TABLET | Freq: Four times a day (QID) | ORAL | 2 refills | Status: DC | PRN
  Filled 2021-12-08: qty 120, 30d supply, fill #0
  Filled 2022-01-08: qty 120, 30d supply, fill #1
  Filled 2022-02-06: qty 120, 30d supply, fill #2

## 2021-12-08 NOTE — Assessment & Plan Note (Signed)
   chronic  taking Tramadol, Flexeril, Mucinex (fibromyalgia)  needing tramadol refill today  f/u in 3 mos

## 2021-12-08 NOTE — Progress Notes (Signed)
Patient ID: Karen Schwartz, female    DOB: 1970-11-28, 50 y.o.   MRN: [id number]  Chief Complaint  Patient presents with   Back Pain    Pt states she needs a refill of Tramadol, which does help her back pain.     HPI: Pain: She reports chronic back pain. was not an injury that may have caused the pain. The pain started years ago and is staying constant. The pain does not radiate. The pain is described as aching, burning, tingling, soreness, stiffness, and throbbing, occurring every day. Symptoms are worse in the: all day & night.  Aggravating factors: sitting, standing, walking, and lifting, pulling. Relieving factors: medication Tramadol, muscle relaxers prn and rest.  She has tried application of heat, application of ice, acetaminophen, NSAIDs, topical anesthetics, physical thearpy, and message therapy with little relief, reports she does get relief with therapeutic massage, but does not get more often d/t expense.    Assessment & Plan:   Problem List Items Addressed This Visit       Musculoskeletal and Integument   Myofascial pain syndrome of thoracic spine - Primary (Chronic)    chronic taking Tramadol, Flexeril, Mucinex (fibromyalgia) needing tramadol refill today f/u in 3 mos      Relevant Medications   traMADol (ULTRAM) 50 MG tablet   Spondylosis without myelopathy or radiculopathy, thoracic region (Chronic)   Relevant Medications   traMADol (ULTRAM) 50 MG tablet   Thoracic facet hypertrophy (Chronic)   Relevant Medications   traMADol (ULTRAM) 50 MG tablet     Other   Fibromyalgia (Chronic)   Relevant Medications   traMADol (ULTRAM) 50 MG tablet   Other Visit Diagnoses     Screening for colon cancer       Relevant Orders   Ambulatory referral to Gastroenterology       Subjective:    Outpatient Medications Prior to Visit  Medication Sig Dispense Refill   amitriptyline (ELAVIL) 75 MG tablet Take 1 tablet (75 mg total) by mouth at bedtime. 90 tablet 1    guaiFENesin (MUCINEX) 600 MG 12 hr tablet Take 600 mg by mouth 2 (two) times daily. PRN - pt takes for fibromyalgia sx, off label     melatonin 5 MG TABS Take 5 mg by mouth at bedtime.     methocarbamol (ROBAXIN) 750 MG tablet Take 1 tablet by mouth every 8 hours as needed for muscle spasms. 90 tablet 1   metoprolol succinate (TOPROL-XL) 25 MG 24 hr tablet Take 1/2 tablet by mouth every evening and 1 tablet every morning. 135 tablet 3   polyethylene glycol (MIRALAX / GLYCOLAX) packet Take 17 g by mouth daily.     SUMAtriptan (IMITREX) 100 MG tablet TAKE 1 TABLET BY MOUTH EVERY 2 HOURS AS NEEDED AS DIRECTED 9 tablet 3   traMADol (ULTRAM) 50 MG tablet Take 1 tablet by mouth every 6 hours as needed for severe pain. **Each refill must last 30 days. 120 tablet 2   No facility-administered medications prior to visit.   Past Medical History:  Diagnosis Date   Bloating 02/11/2018   Chronic pain syndrome 12/06/2020   Chronic pain syndrome 12/06/2020   Chronic upper back pain (Bilateral) 02/07/2021   Disorder of skeletal system 12/06/2020   Fibromyalgia    Hypertension    boderline   IBS (irritable bowel syndrome)    Pharmacologic therapy 12/06/2020   Problems influencing health status 12/06/2020   Thoracic facet syndrome (Bilateral) 02/07/2021   No past surgical  history on file. Allergies  Allergen Reactions   Ampicillin    Cymbalta [Duloxetine Hcl]    Latex Itching   Lyrica [Pregabalin]    Zantac [Ranitidine Hcl]     Tachycardia      Objective:    Physical Exam Vitals and nursing note reviewed.  Constitutional:      Appearance: Normal appearance.  Cardiovascular:     Rate and Rhythm: Normal rate and regular rhythm.  Pulmonary:     Effort: Pulmonary effort is normal.     Breath sounds: Normal breath sounds.  Musculoskeletal:        General: Normal range of motion.  Skin:    General: Skin is warm and dry.  Neurological:     Mental Status: She is alert.  Psychiatric:        Mood  and Affect: Mood normal.        Behavior: Behavior normal.    BP 120/83 (BP Location: Left Arm, Patient Position: Sitting, Cuff Size: Large)   Pulse 96   Temp 98.3 F (36.8 C) (Temporal)   Ht '5\' 2"'$  (1.575 m)   Wt 141 lb (64 kg)   LMP 12/05/2021 (Exact Date)   SpO2 99%   BMI 25.79 kg/m  Wt Readings from Last 3 Encounters:  12/08/21 141 lb (64 kg)  08/24/21 140 lb 6 oz (63.7 kg)  06/01/21 136 lb (61.7 kg)       Jeanie Sewer, NP

## 2021-12-10 IMAGING — MG DIGITAL SCREENING BILAT W/ TOMO W/ CAD
8 series · 8 of 24 positions shown · non-contrast
Comparison: Previous exam(s).

CLINICAL DATA: Screening.

EXAM:
DIGITAL SCREENING BILATERAL MAMMOGRAM WITH TOMO AND CAD

[R MLO synth-2D]
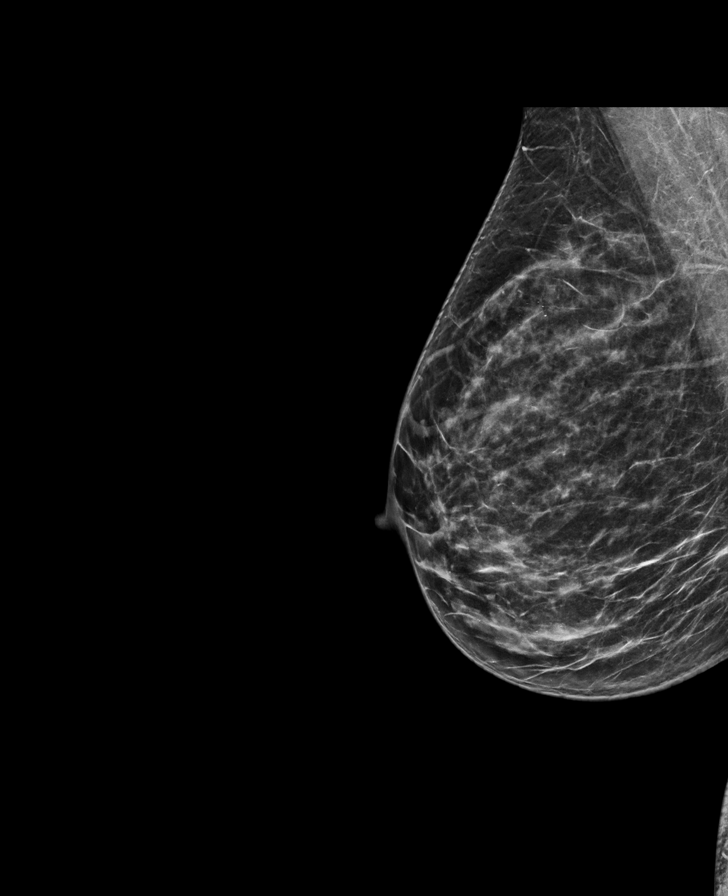

[R CC synth-2D]
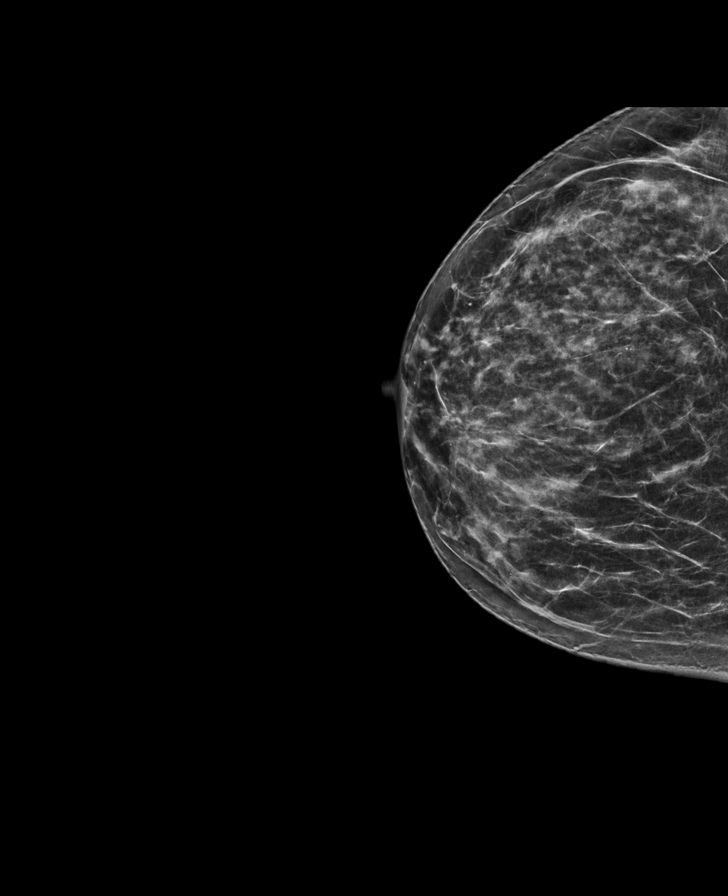

[L MLO synth-2D]
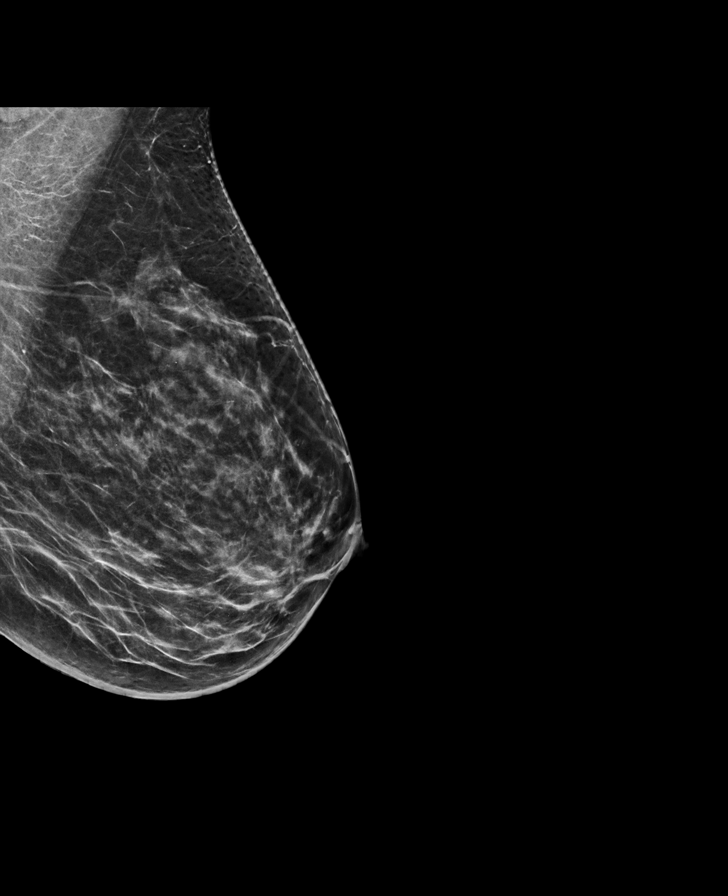

[L CC synth-2D]
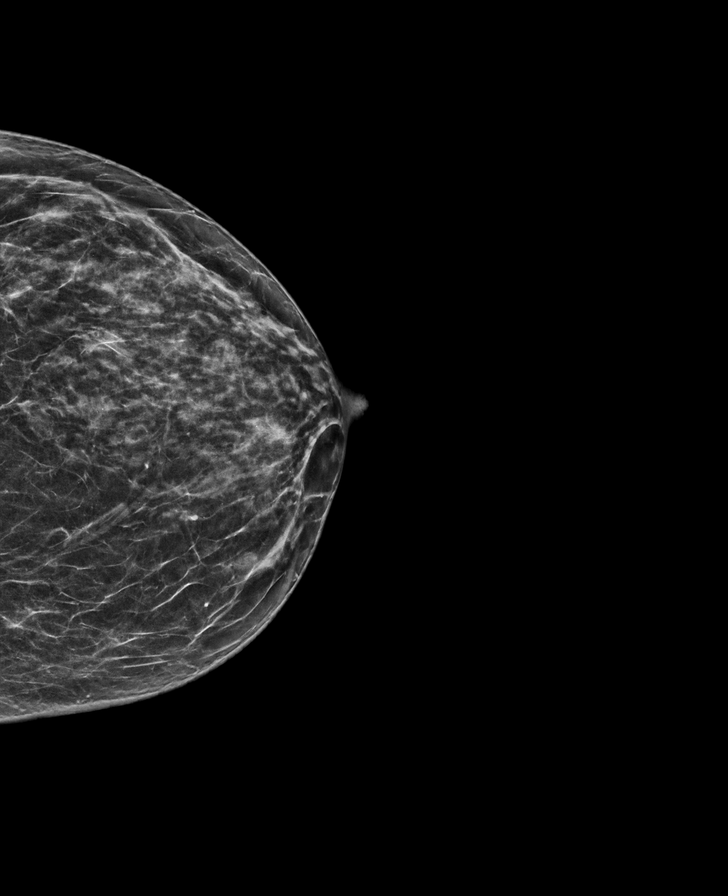

[L CC tomo · tomo slice 29/58.0]
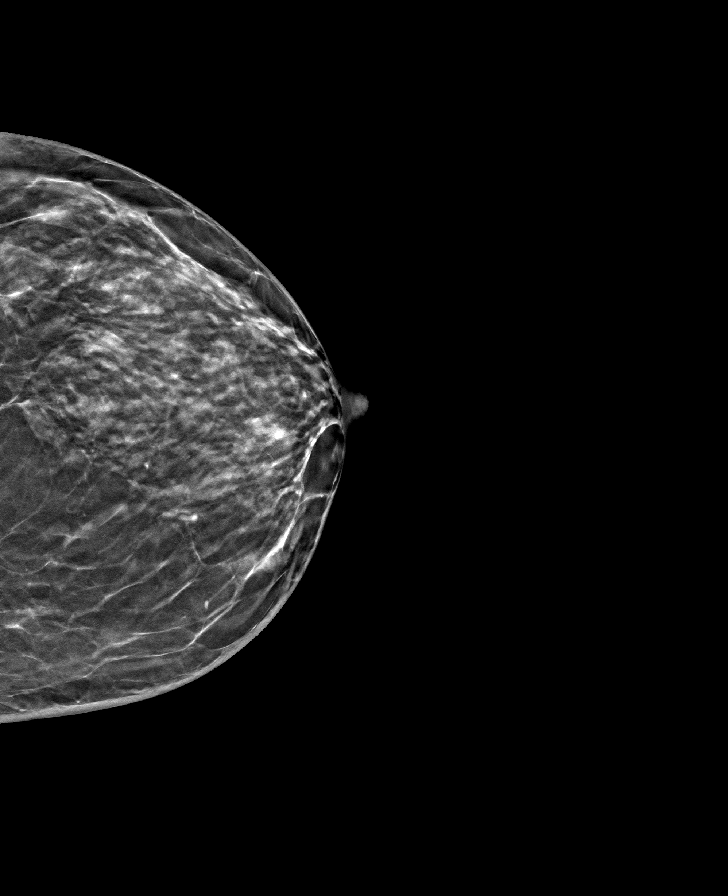

[L MLO tomo · tomo slice 35/68.0]
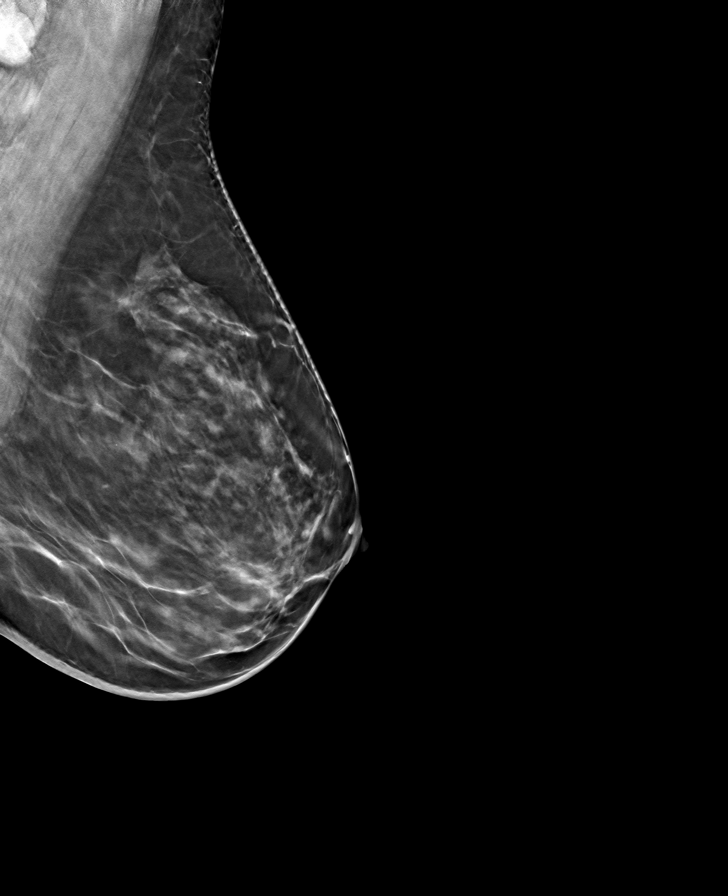

[R MLO tomo · tomo slice 35/68.0]
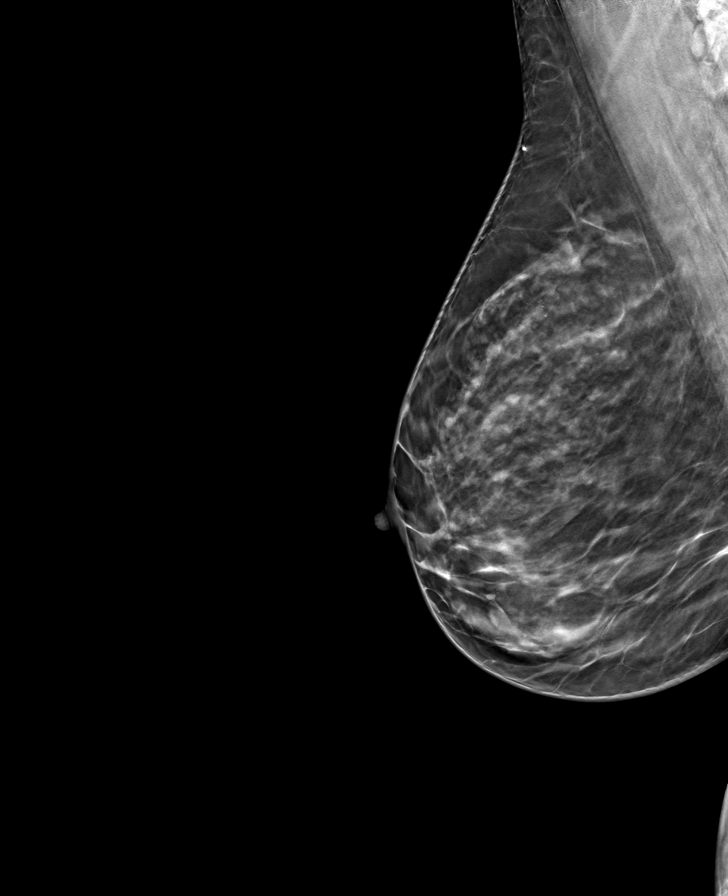

[R CC tomo · tomo slice 31/62.0]
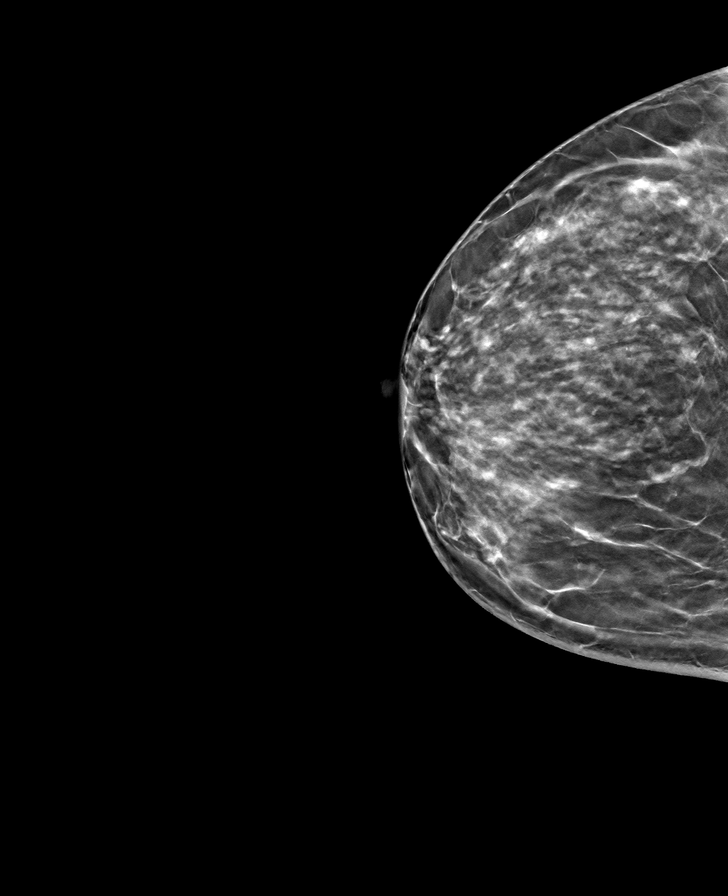

[8 of 24 positions shown; findings below may reference images not displayed]

ACR Breast Density Category c: The breast tissue is heterogeneously
dense, which may obscure small masses.
FINDINGS: In the right breast a group of calcifications requires further
evaluation.

A mass in the left axilla requires further evaluation.

Images were processed with CAD.
IMPRESSION: Further evaluation is suggested for a group of calcifications in the
right breast.

Further evaluation is suggested for a possible left axillary mass.

RECOMMENDATION:
Diagnostic mammogram and possibly ultrasound of both breasts.
(Code:RP-V-44F)

The patient will be contacted regarding the findings, and additional
imaging will be scheduled.

BI-RADS CATEGORY  0: Incomplete. Need additional imaging evaluation
and/or prior mammograms for comparison.

## 2021-12-20 ENCOUNTER — Encounter: Payer: Self-pay | Admitting: Internal Medicine

## 2022-01-06 ENCOUNTER — Encounter: Payer: Self-pay | Admitting: Gastroenterology

## 2022-01-09 ENCOUNTER — Other Ambulatory Visit (HOSPITAL_COMMUNITY): Payer: Self-pay

## 2022-01-11 ENCOUNTER — Other Ambulatory Visit (HOSPITAL_COMMUNITY): Payer: Self-pay

## 2022-01-12 ENCOUNTER — Other Ambulatory Visit (HOSPITAL_COMMUNITY): Payer: Self-pay

## 2022-01-16 ENCOUNTER — Ambulatory Visit (AMBULATORY_SURGERY_CENTER): Payer: No Typology Code available for payment source

## 2022-01-16 ENCOUNTER — Other Ambulatory Visit (HOSPITAL_COMMUNITY): Payer: Self-pay

## 2022-01-16 VITALS — Ht 62.0 in | Wt 143.0 lb

## 2022-01-16 DIAGNOSIS — Z1211 Encounter for screening for malignant neoplasm of colon: Secondary | ICD-10-CM

## 2022-01-16 MED ORDER — NA SULFATE-K SULFATE-MG SULF 17.5-3.13-1.6 GM/177ML PO SOLN
1.0000 | ORAL | 0 refills | Status: DC
  Filled 2022-01-16: qty 354, 1d supply, fill #0

## 2022-01-16 NOTE — Progress Notes (Signed)
No egg or soy allergy known to patient  No issues known to pt with past sedation with any surgeries or procedures Patient denies ever being told they had issues or difficulty with intubation  No FH of Malignant Hyperthermia Pt is not on diet pills Pt is not on  home 02  Pt is not on blood thinners  Pt reports issues with constipation  No A fib or A flutter Have any cardiac testing pending--denied Pt instructed to use Single[employer]com or GoodRx for a price reduction on prep

## 2022-01-26 ENCOUNTER — Other Ambulatory Visit: Payer: Self-pay | Admitting: Family

## 2022-01-26 ENCOUNTER — Other Ambulatory Visit (HOSPITAL_COMMUNITY): Payer: Self-pay

## 2022-01-26 DIAGNOSIS — G8929 Other chronic pain: Secondary | ICD-10-CM

## 2022-01-26 MED ORDER — METHOCARBAMOL 750 MG PO TABS
750.0000 mg | ORAL_TABLET | Freq: Three times a day (TID) | ORAL | 1 refills | Status: DC | PRN
  Filled 2022-01-26: qty 90, 30d supply, fill #0

## 2022-01-27 ENCOUNTER — Other Ambulatory Visit (HOSPITAL_COMMUNITY): Payer: Self-pay

## 2022-02-06 ENCOUNTER — Other Ambulatory Visit (HOSPITAL_COMMUNITY): Payer: Self-pay

## 2022-02-09 ENCOUNTER — Encounter: Payer: Self-pay | Admitting: Gastroenterology

## 2022-02-13 ENCOUNTER — Encounter: Payer: No Typology Code available for payment source | Admitting: Internal Medicine

## 2022-02-13 ENCOUNTER — Encounter: Payer: Self-pay | Admitting: *Deleted

## 2022-02-14 ENCOUNTER — Encounter: Payer: Self-pay | Admitting: Gastroenterology

## 2022-02-14 ENCOUNTER — Ambulatory Visit (AMBULATORY_SURGERY_CENTER): Payer: No Typology Code available for payment source | Admitting: Gastroenterology

## 2022-02-14 VITALS — BP 116/74 | HR 68 | Temp 97.8°F | Resp 13 | Ht 62.0 in | Wt 143.0 lb

## 2022-02-14 DIAGNOSIS — Z1211 Encounter for screening for malignant neoplasm of colon: Secondary | ICD-10-CM

## 2022-02-14 DIAGNOSIS — K635 Polyp of colon: Secondary | ICD-10-CM | POA: Diagnosis not present

## 2022-02-14 DIAGNOSIS — D122 Benign neoplasm of ascending colon: Secondary | ICD-10-CM

## 2022-02-14 DIAGNOSIS — K621 Rectal polyp: Secondary | ICD-10-CM | POA: Diagnosis not present

## 2022-02-14 DIAGNOSIS — D128 Benign neoplasm of rectum: Secondary | ICD-10-CM

## 2022-02-14 MED ORDER — SODIUM CHLORIDE 0.9 % IV SOLN: 500.0000 mL | Freq: Once | INTRAVENOUS | Status: DC

## 2022-02-14 NOTE — Progress Notes (Signed)
Called to room to assist during endoscopic procedure.  Patient ID and intended procedure confirmed with present staff. Received instructions for my participation in the procedure from the performing physician.  

## 2022-02-14 NOTE — Patient Instructions (Addendum)
Toileting tips to help with your constipation - Drink at least 64-80 ounces of water/liquid per day. - Establish a time to try to move your bowels every day.  For many people, this is after a cup of coffee or after a meal such as breakfast. - Sit all of the way back on the toilet keeping your back fairly straight and while sitting up, try to rest the tops of your forearms on your upper thighs.   - Raising your feet with a step stool/squatty potty can be helpful to improve the angle that allows your stool to pass through the rectum. - Relax the rectum feeling it bulge toward the toilet water.  If you feel your rectum raising toward your body, you are contracting rather than relaxing. - Breathe in and slowly exhale. "Belly breath" by expanding your belly towards your belly button. Keep belly expanded as you gently direct pressure down and back to the anus.  A low pitched GRRR sound can assist with increasing intra-abdominal pressure.  - Repeat 3-4 times. If unsuccessful, contract the pelvic floor to restore normal tone and get off the toilet.  Avoid excessive straining. - To reduce excessive wiping by teaching your anus to normally contract, place hands on outer aspect of knees and resist knee movement outward.  Hold 5-10 second then place hands just inside of knees and resist inward movement of knees.  Hold 5 seconds.  Repeat a few times each way.   Discharge instructions given. Handouts on polyps and Hemorrhoids. Resume previous medications. YOU HAD AN ENDOSCOPIC PROCEDURE TODAY AT [facility]:   Refer to the procedure report that was given to you for any specific questions about what was found during the examination.  If the procedure report does not answer your questions, please call your gastroenterologist to clarify.  If you requested that your care partner not be given the details of your procedure findings, then the procedure report has been included in a sealed envelope for you  to review at your convenience later.  YOU SHOULD EXPECT: Some feelings of bloating in the abdomen. Passage of more gas than usual.  Walking can help get rid of the air that was put into your GI tract during the procedure and reduce the bloating. If you had a lower endoscopy (such as a colonoscopy or flexible sigmoidoscopy) you may notice spotting of blood in your stool or on the toilet paper. If you underwent a bowel prep for your procedure, you may not have a normal bowel movement for a few days.  Please Note:  You might notice some irritation and congestion in your nose or some drainage.  This is from the oxygen used during your procedure.  There is no need for concern and it should clear up in a day or so.  SYMPTOMS TO REPORT IMMEDIATELY:  Following lower endoscopy (colonoscopy or flexible sigmoidoscopy):  Excessive amounts of blood in the stool  Significant tenderness or worsening of abdominal pains  Swelling of the abdomen that is new, acute  Fever of 100F or higher   For urgent or emergent issues, a gastroenterologist can be reached at any hour by calling [phone]. Do not use MyChart messaging for urgent concerns.    DIET:  We do recommend a small meal at first, but then you may proceed to your regular diet.  Drink plenty of fluids but you should avoid alcoholic beverages for 24 hours.  ACTIVITY:  You should plan to take it easy for  the rest of today and you should NOT DRIVE or use heavy machinery until tomorrow (because of the sedation medicines used during the test).    FOLLOW UP: Our staff will call the number listed on your records the next business day following your procedure.  We will call around 7:15- 8:00 am to check on you and address any questions or concerns that you may have regarding the information given to you following your procedure. If we do not reach you, we will leave a message.     If any biopsies were taken you will be contacted by phone or by letter  within the next 1-3 weeks.  Please call us at [phone] if you have not heard about the biopsies in 3 weeks.    SIGNATURES/CONFIDENTIALITY: You and/or your care partner have signed paperwork which will be entered into your electronic medical record.  These signatures attest to the fact that that the information above on your After Visit Summary has been reviewed and is understood.  Full responsibility of the confidentiality of this discharge information lies with you and/or your care-partner.

## 2022-02-14 NOTE — Progress Notes (Signed)
Pt's states no medical or surgical changes since previsit or office visit. 

## 2022-02-14 NOTE — Progress Notes (Signed)
Report to pacu rn. Vss. Care resumed by rn. 

## 2022-02-14 NOTE — Op Note (Signed)
[facility] Patient Name: Karen Schwartz Procedure Date: 02/14/2022 11:43 AM MRN: [id number] Endoscopist: Justice Britain , MD Age: 51 Referring MD:  Date of Birth: 1970-11-24 Gender: Female Account #: [id number] Procedure:                Colonoscopy Indications:              Screening for colorectal malignant neoplasm Medicines:                Monitored Anesthesia Care Procedure:                Pre-Anesthesia Assessment:                           - Prior to the procedure, a History and Physical                            was performed, and patient medications and                            allergies were reviewed. The patient's tolerance of                            previous anesthesia was also reviewed. The risks                            and benefits of the procedure and the sedation                            options and risks were discussed with the patient.                            All questions were answered, and informed consent                            was obtained. Prior Anticoagulants: The patient has                            taken no previous anticoagulant or antiplatelet                            agents. ASA Grade Assessment: II - A patient with                            mild systemic disease. After reviewing the risks                            and benefits, the patient was deemed in                            satisfactory condition to undergo the procedure.                           After obtaining informed consent, the colonoscope  was passed under direct vision. Throughout the                            procedure, the patient's blood pressure, pulse, and                            oxygen saturations were monitored continuously. The                            CF [id number] #[id number] was introduced through the anus                            and advanced to the 5 cm into the ileum. The                            colonoscopy was  somewhat difficult due to a                            tortuous colon. Successful completion of the                            procedure was aided by changing the patient's                            position, using manual pressure, straightening and                            shortening the scope to obtain bowel loop reduction                            and using scope torsion. The patient tolerated the                            procedure. The quality of the bowel preparation was                            good. The terminal ileum, ileocecal valve,                            appendiceal orifice, and rectum were photographed. Scope In: 11:55:44 AM Scope Out: 12:13:41 PM Scope Withdrawal Time: 0 hours 11 minutes 18 seconds  Total Procedure Duration: 0 hours 17 minutes 57 seconds  Findings:                 The digital rectal exam findings include                            hemorrhoids. Pertinent negatives include no                            palpable rectal lesions.                           The terminal ileum and ileocecal valve appeared  normal.                           Three sessile polyps were found in the rectum (1)                            and ascending colon (2). The polyps were 2 to 4 mm                            in size. These polyps were removed with a cold                            snare. Resection and retrieval were complete.                           Normal mucosa was found in the entire colon                            otherwise.                           Non-bleeding non-thrombosed internal hemorrhoids                            were found during retroflexion, during perianal                            exam and during digital exam. The hemorrhoids were                            Grade II (internal hemorrhoids that prolapse but                            reduce spontaneously). Complications:            No immediate complications. Estimated  Blood Loss:     Estimated blood loss was minimal. Impression:               - Hemorrhoids found on digital rectal exam.                           - The examined portion of the ileum was normal.                           - Three, 2 to 4 mm polyps in the rectum and in the                            ascending colon, removed with a cold snare.                            Resected and retrieved.                           - Normal mucosa in the entire examined colon  otherwise.                           - Non-bleeding non-thrombosed internal hemorrhoids. Recommendation:           - The patient will be observed post-procedure,                            until all discharge criteria are met.                           - Discharge patient to home.                           - Patient has a contact number available for                            emergencies. The signs and symptoms of potential                            delayed complications were discussed with the                            patient. Return to normal activities tomorrow.                            Written discharge instructions were provided to the                            patient.                           - High fiber diet.                           - Use FiberCon 1-2 tablets PO daily.                           - Continue present medications.                           - May increase MiraLAX to twice daily.                           - If patient would like to trial a different                            laxative therapy we may have samples that can help                            in the future and she can reach out to [place] and also                            schedule a follow-up in clinic if she desires.                           -  Await pathology results.                           - Toileting technique found on patient instructions.                           - Repeat colonoscopy in 3/09/25/08 years for                             surveillance based on pathology results.                           - The findings and recommendations were discussed                            with the patient.                           - The findings and recommendations were discussed                            with the patient's family. Justice Britain, MD 02/14/2022 12:19:58 PM

## 2022-02-14 NOTE — Progress Notes (Signed)
GASTROENTEROLOGY PROCEDURE H&P NOTE   Primary Care Physician: Jeanie Sewer, NP  HPI: Karen Schwartz is a 51 y.o. female who presents for colon cancer screening and history of constipation.  Past Medical History:  Diagnosis Date   Bloating 02/11/2018   Chronic pain syndrome 12/06/2020   Chronic pain syndrome 12/06/2020   Chronic upper back pain (Bilateral) 02/07/2021   Disorder of skeletal system 12/06/2020   Fibromyalgia    Hypertension    boderline   IBS (irritable bowel syndrome)    Pharmacologic therapy 12/06/2020   Problems influencing health status 12/06/2020   Thoracic facet syndrome (Bilateral) 02/07/2021   History reviewed. No pertinent surgical history. Current Outpatient Medications  Medication Sig Dispense Refill   amitriptyline (ELAVIL) 75 MG tablet Take 1 tablet (75 mg total) by mouth at bedtime. 90 tablet 1   Cholecalciferol (VITAMIN D) 125 MCG (5000 UT) CAPS Take by mouth.     guaiFENesin (MUCINEX) 600 MG 12 hr tablet Take 600 mg by mouth 2 (two) times daily. PRN - pt takes for fibromyalgia sx, off label     melatonin 5 MG TABS Take 5 mg by mouth at bedtime.     methocarbamol (ROBAXIN) 750 MG tablet Take 1 tablet by mouth every 8 hours as needed for muscle spasms. 90 tablet 1   metoprolol succinate (TOPROL-XL) 25 MG 24 hr tablet Take 1/2 tablet by mouth every evening and 1 tablet every morning. 135 tablet 3   Multiple Vitamin (MULTIVITAMIN) tablet Take 1 tablet by mouth daily.     polyethylene glycol (MIRALAX / GLYCOLAX) packet Take 17 g by mouth daily.     traMADol (ULTRAM) 50 MG tablet Take 1 tablet by mouth every 6 hours as needed for severe pain. **Each refill must last 30 days. 120 tablet 2   SUMAtriptan (IMITREX) 100 MG tablet TAKE 1 TABLET BY MOUTH EVERY 2 HOURS AS NEEDED AS DIRECTED 9 tablet 3   Current Facility-Administered Medications  Medication Dose Route Frequency Provider Last Rate Last Admin   0.9 %  sodium chloride infusion  500 mL Intravenous  Once Mansouraty, Telford Nab., MD        Current Outpatient Medications:    amitriptyline (ELAVIL) 75 MG tablet, Take 1 tablet (75 mg total) by mouth at bedtime., Disp: 90 tablet, Rfl: 1   Cholecalciferol (VITAMIN D) 125 MCG (5000 UT) CAPS, Take by mouth., Disp: , Rfl:    guaiFENesin (MUCINEX) 600 MG 12 hr tablet, Take 600 mg by mouth 2 (two) times daily. PRN - pt takes for fibromyalgia sx, off label, Disp: , Rfl:    melatonin 5 MG TABS, Take 5 mg by mouth at bedtime., Disp: , Rfl:    methocarbamol (ROBAXIN) 750 MG tablet, Take 1 tablet by mouth every 8 hours as needed for muscle spasms., Disp: 90 tablet, Rfl: 1   metoprolol succinate (TOPROL-XL) 25 MG 24 hr tablet, Take 1/2 tablet by mouth every evening and 1 tablet every morning., Disp: 135 tablet, Rfl: 3   Multiple Vitamin (MULTIVITAMIN) tablet, Take 1 tablet by mouth daily., Disp: , Rfl:    polyethylene glycol (MIRALAX / GLYCOLAX) packet, Take 17 g by mouth daily., Disp: , Rfl:    traMADol (ULTRAM) 50 MG tablet, Take 1 tablet by mouth every 6 hours as needed for severe pain. **Each refill must last 30 days., Disp: 120 tablet, Rfl: 2   SUMAtriptan (IMITREX) 100 MG tablet, TAKE 1 TABLET BY MOUTH EVERY 2 HOURS AS NEEDED AS DIRECTED, Disp:  9 tablet, Rfl: 3  Current Facility-Administered Medications:    0.9 %  sodium chloride infusion, 500 mL, Intravenous, Once, Mansouraty, Telford Nab., MD Allergies  Allergen Reactions   Ampicillin    Cymbalta [Duloxetine Hcl]    Latex Itching   Lyrica [Pregabalin]    Zantac [Ranitidine Hcl]     Tachycardia   Family History  Problem Relation Age of Onset   Hypertension Mother    Depression Mother    Stroke Mother    Rectal cancer Father    Cancer Father        bladder cancer, rectal cancer   Atrial fibrillation Father    Hypertension Sister    Depression Sister    Mental illness Brother        suicide   Alcohol abuse Brother    Diabetes Maternal Grandmother    Macular degeneration Maternal  Grandmother    Dementia Maternal Grandfather    Esophageal cancer Neg Hx    Inflammatory bowel disease Neg Hx    Liver disease Neg Hx    Pancreatic cancer Neg Hx    Stomach cancer Neg Hx    Colon cancer Neg Hx    Social History   Socioeconomic History   Marital status: Married    Spouse name: Not on file   Number of children: 2   Years of education: Not on file   Highest education level: Not on file  Occupational History   Occupation: RN  Tobacco Use   Smoking status: Former    Packs/day: 1.00    Years: 12.00    Total pack years: 12.00    Types: Cigarettes    Quit date: 05/23/1999    Years since quitting: 22.7   Smokeless tobacco: Never  Vaping Use   Vaping Use: Never used  Substance and Sexual Activity   Alcohol use: No    Alcohol/week: 0.0 standard drinks of alcohol    Comment: beer   Drug use: No   Sexual activity: Yes    Birth control/protection: None    Comment: divorced, remarried, 2 kids, RN  Other Topics Concern   Not on file  Social History Narrative   Not on file   Social Determinants of Health   Financial Resource Strain: Not on file  Food Insecurity: Not on file  Transportation Needs: Not on file  Physical Activity: Not on file  Stress: Not on file  Social Connections: Not on file  Intimate Partner Violence: Not on file    Physical Exam: Today's Vitals   02/14/22 1050  BP: 114/87  Pulse: 91  Temp: 97.8 F (36.6 C)  TempSrc: Temporal  SpO2: 99%  Weight: 143 lb (64.9 kg)  Height: '5\' 2"'$  (1.575 m)   Body mass index is 26.16 kg/m. GEN: NAD EYE: Sclerae anicteric ENT: MMM CV: Non-tachycardic GI: Soft, NT/ND NEURO:  Alert & Oriented x 3  Lab Results: No results for input(s): "WBC", "HGB", "HCT", "PLT" in the last 72 hours. BMET No results for input(s): "NA", "K", "CL", "CO2", "GLUCOSE", "BUN", "CREATININE", "CALCIUM" in the last 72 hours. LFT No results for input(s): "PROT", "ALBUMIN", "AST", "ALT", "ALKPHOS", "BILITOT", "BILIDIR",  "IBILI" in the last 72 hours. PT/INR No results for input(s): "LABPROT", "INR" in the last 72 hours.   Impression / Plan: This is a 51 y.o.female who presents for colon cancer screening and history of constipation.  The risks and benefits of endoscopic evaluation/treatment were discussed with the patient and/or family; these include but are not limited  to the risk of perforation, infection, bleeding, missed lesions, lack of diagnosis, severe illness requiring hospitalization, as well as anesthesia and sedation related illnesses.  The patient's history has been reviewed, patient examined, no change in status, and deemed stable for procedure.  The patient and/or family is agreeable to proceed.    Justice Britain, MD [facility] Office # [phone]

## 2022-02-15 ENCOUNTER — Telehealth: Payer: Self-pay

## 2022-02-15 NOTE — Telephone Encounter (Signed)
  Follow up Call-     02/14/2022   10:55 AM  Call back number  Post procedure Call Back phone  # [phone]  Permission to leave phone message Yes     Patient questions:  Do you have a fever, pain , or abdominal swelling? No. Pain Score  0 *  Have you tolerated food without any problems? Yes.    Have you been able to return to your normal activities? Yes.    Do you have any questions about your discharge instructions: Diet   No. Medications  No. Follow up visit  No.  Do you have questions or concerns about your Care? No.  Actions: * If pain score is 4 or above: No action needed, pain <4.

## 2022-02-17 ENCOUNTER — Encounter: Payer: Self-pay | Admitting: Gastroenterology

## 2022-03-07 ENCOUNTER — Other Ambulatory Visit (HOSPITAL_BASED_OUTPATIENT_CLINIC_OR_DEPARTMENT_OTHER): Payer: Self-pay

## 2022-03-07 ENCOUNTER — Other Ambulatory Visit (HOSPITAL_COMMUNITY): Payer: Self-pay

## 2022-03-07 ENCOUNTER — Telehealth (INDEPENDENT_AMBULATORY_CARE_PROVIDER_SITE_OTHER): Payer: No Typology Code available for payment source | Admitting: Family

## 2022-03-07 ENCOUNTER — Encounter: Payer: Self-pay | Admitting: Family

## 2022-03-07 VITALS — Ht 62.0 in | Wt 144.0 lb

## 2022-03-07 DIAGNOSIS — M7918 Myalgia, other site: Secondary | ICD-10-CM | POA: Diagnosis not present

## 2022-03-07 DIAGNOSIS — G894 Chronic pain syndrome: Secondary | ICD-10-CM | POA: Diagnosis not present

## 2022-03-07 DIAGNOSIS — R000 Tachycardia, unspecified: Secondary | ICD-10-CM | POA: Diagnosis not present

## 2022-03-07 DIAGNOSIS — M797 Fibromyalgia: Secondary | ICD-10-CM

## 2022-03-07 MED ORDER — METHOCARBAMOL 750 MG PO TABS
750.0000 mg | ORAL_TABLET | Freq: Three times a day (TID) | ORAL | 1 refills | Status: DC | PRN
  Filled 2022-03-07: qty 180, 60d supply, fill #0
  Filled 2022-06-04: qty 180, 60d supply, fill #1

## 2022-03-07 MED ORDER — TRAMADOL HCL 50 MG PO TABS
50.0000 mg | ORAL_TABLET | Freq: Four times a day (QID) | ORAL | 3 refills | Status: DC | PRN
  Filled 2022-03-07: qty 120, 30d supply, fill #0
  Filled 2022-04-07 – 2022-04-10 (×2): qty 120, 30d supply, fill #1
  Filled 2022-05-09: qty 120, 30d supply, fill #2
  Filled 2022-06-04 – 2022-06-08 (×2): qty 120, 30d supply, fill #3

## 2022-03-07 MED ORDER — METOPROLOL SUCCINATE ER 25 MG PO TB24
ORAL_TABLET | ORAL | 1 refills | Status: DC
  Filled 2022-03-07: qty 135, fill #0
  Filled 2022-04-16: qty 135, 90d supply, fill #0
  Filled 2022-07-12: qty 135, 90d supply, fill #1

## 2022-03-07 MED ORDER — AMITRIPTYLINE HCL 75 MG PO TABS
75.0000 mg | ORAL_TABLET | Freq: Every day | ORAL | 1 refills | Status: DC
  Filled 2022-03-07: qty 90, 90d supply, fill #0
  Filled 2022-06-04: qty 90, 90d supply, fill #1

## 2022-03-07 NOTE — Progress Notes (Signed)
MyChart Video Visit    Virtual Visit via Video Note   This format is felt to be most appropriate for this patient at this time. Physical exam was limited by quality of the video and audio technology used for the visit. CMA was able to get the patient set up on a video visit.  Patient location: Home. Patient and provider in visit Provider location: Office  I discussed the limitations of evaluation and management by telemedicine and the availability of in person appointments. The patient expressed understanding and agreed to proceed.  Visit Date: 03/07/2022  Today's healthcare provider: Jeanie Sewer, NP     Subjective:   Patient ID: Karen Schwartz, female    DOB: 1970/08/19, 51 y.o.   MRN: [id number]  Chief Complaint  Patient presents with   Follow-up    3 month    HPI Pain: She reports chronic back pain. was not an injury that may have caused the pain. The pain started years ago and is staying constant. The pain does not radiate. The pain is described as aching, burning, tingling, soreness, stiffness, and throbbing, occurring every day. Symptoms are worse in the: all day & night.  Aggravating factors: sitting, standing, walking, and lifting, pulling. Relieving factors: medication Tramadol, Naproxen, Elavil, muscle relaxers and rest.  She has tried application of heat, application of ice, acetaminophen, NSAIDs, topical anesthetics, physical therapy, and message therapy with little relief, reports she does get relief with therapeutic massage, but does not get more often d/t expense.   Tachycardia:  with mild hypertension, denies palpitations, controlled with Metoprolol '25mg'$  qam and 12.'5mg'$  qhs.   Assessment & Plan:   Problem List Items Addressed This Visit       Musculoskeletal and Integument   Myofascial pain syndrome of thoracic spine - Primary (Chronic)    chronic taking Tramadol, Robaxin, Mucinex (fibromyalgia) refilling meds today f/u in 4 mos        Relevant Medications   NAPROXEN PO   amitriptyline (ELAVIL) 75 MG tablet   methocarbamol (ROBAXIN) 750 MG tablet   traMADol (ULTRAM) 50 MG tablet     Other   Fibromyalgia (Chronic)    chronic taking Tramadol, Robaxin, Mucinex (fibromyalgia -off label) sending refills today f/u in 4 mos.       Relevant Medications   NAPROXEN PO   amitriptyline (ELAVIL) 75 MG tablet   methocarbamol (ROBAXIN) 750 MG tablet   traMADol (ULTRAM) 50 MG tablet   Tachycardia    chronic stable on Metoprolol '25mg'$  qam, 12.'5mg'$  qhs sending refill f/u in 6 mos      Relevant Medications   metoprolol succinate (TOPROL-XL) 25 MG 24 hr tablet   Other Visit Diagnoses     Chronic pain syndrome       Relevant Medications   NAPROXEN PO   amitriptyline (ELAVIL) 75 MG tablet   methocarbamol (ROBAXIN) 750 MG tablet   traMADol (ULTRAM) 50 MG tablet       Past Medical History:  Diagnosis Date   Atopic dermatitis 12/06/2020   Bloating 02/11/2018   Chronic pain syndrome 12/06/2020   Chronic pain syndrome 12/06/2020   Chronic thoracic back pain (1ry area of Pain) (Bilateral) (L>R) 12/06/2020   Chronic upper back pain (Bilateral) 02/07/2021   Contact dermatitis and other eczema due to other chemical products 12/06/2020   Disorder of skeletal system 12/06/2020   Fibromyalgia    Hypertension    boderline   IBS (irritable bowel syndrome)    Other chronic allergic  conjunctivitis 12/06/2020   -patient with periocular pruritis and epiphora but with very few nasal symptoms -will give trial of zaditor eye dropds prn  -given h/o severe atopic derm and periocular skin involvement cannot r/o atopic keratoconjunctivitis and thus will refer to derm   Pharmacologic therapy 12/06/2020   Problems influencing health status 12/06/2020   Thoracic facet syndrome (Bilateral) 02/07/2021    No past surgical history on file.  Outpatient Medications Prior to Visit  Medication Sig Dispense Refill   Cholecalciferol (VITAMIN D) 125 MCG  (5000 UT) CAPS Take by mouth.     melatonin 5 MG TABS Take 5 mg by mouth at bedtime.     Multiple Vitamin (MULTIVITAMIN) tablet Take 1 tablet by mouth daily.     NAPROXEN PO Take by mouth as needed.     polyethylene glycol (MIRALAX / GLYCOLAX) packet Take 17 g by mouth daily.     SUMAtriptan (IMITREX) 100 MG tablet TAKE 1 TABLET BY MOUTH EVERY 2 HOURS AS NEEDED AS DIRECTED 9 tablet 3   amitriptyline (ELAVIL) 75 MG tablet Take 1 tablet (75 mg total) by mouth at bedtime. 90 tablet 1   methocarbamol (ROBAXIN) 750 MG tablet Take 1 tablet by mouth every 8 hours as needed for muscle spasms. 90 tablet 1   metoprolol succinate (TOPROL-XL) 25 MG 24 hr tablet Take 1/2 tablet by mouth every evening and 1 tablet every morning. 135 tablet 3   traMADol (ULTRAM) 50 MG tablet Take 1 tablet by mouth every 6 hours as needed for severe pain. **Each refill must last 30 days. 120 tablet 2   guaiFENesin (MUCINEX) 600 MG 12 hr tablet Take 600 mg by mouth 2 (two) times daily. PRN - pt takes for fibromyalgia sx, off label (Patient not taking: Reported on 03/07/2022)     No facility-administered medications prior to visit.    Allergies  Allergen Reactions   Ampicillin    Cymbalta [Duloxetine Hcl]    Latex Itching   Lyrica [Pregabalin]    Zantac [Ranitidine Hcl]     Tachycardia       Objective:   Physical Exam Vitals and nursing note reviewed.  Constitutional:      General: Pt is not in acute distress.    Appearance: Normal appearance.  HENT:     Head: Normocephalic.  Pulmonary:     Effort: No respiratory distress.  Musculoskeletal:     Cervical back: Normal range of motion.  Skin:    General: Skin is dry.     Coloration: Skin is not pale.  Neurological:     Mental Status: Pt is alert and oriented to person, place, and time.  Psychiatric:        Mood and Affect: Mood normal.   Ht '5\' 2"'$  (1.575 m)   Wt 144 lb (65.3 kg)   LMP 02/28/2022 (Approximate)   BMI 26.34 kg/m   Wt Readings from Last 3  Encounters:  03/07/22 144 lb (65.3 kg)  02/14/22 143 lb (64.9 kg)  01/16/22 143 lb (64.9 kg)      I discussed the assessment and treatment plan with the patient. The patient was provided an opportunity to ask questions and all were answered. The patient agreed with the plan and demonstrated an understanding of the instructions.   The patient was advised to call back or seek an in-person evaluation if the symptoms worsen or if the condition fails to improve as anticipated.  Jeanie Sewer, NP [place] [phone] (phone) [phone] (fax)   [facility]  

## 2022-03-07 NOTE — Assessment & Plan Note (Signed)
.   chronic . taking Tramadol, Robaxin, Mucinex (fibromyalgia) . refilling meds today . f/u in 4 mos

## 2022-03-07 NOTE — Assessment & Plan Note (Signed)
.   chronic . taking Tramadol, Robaxin, Mucinex (fibromyalgia -off label) . sending refills today . f/u in 4 mos.

## 2022-03-07 NOTE — Assessment & Plan Note (Signed)
   chronic  stable on Metoprolol '25mg'$  qam, 12.'5mg'$  qhs  sending refill  f/u in 6 mos

## 2022-03-08 ENCOUNTER — Other Ambulatory Visit (HOSPITAL_COMMUNITY): Payer: Self-pay

## 2022-03-09 ENCOUNTER — Other Ambulatory Visit (HOSPITAL_COMMUNITY): Payer: Self-pay

## 2022-03-10 ENCOUNTER — Ambulatory Visit: Payer: No Typology Code available for payment source | Admitting: Family

## 2022-03-15 ENCOUNTER — Ambulatory Visit (INDEPENDENT_AMBULATORY_CARE_PROVIDER_SITE_OTHER): Payer: No Typology Code available for payment source | Admitting: Advanced Practice Midwife

## 2022-03-15 ENCOUNTER — Encounter: Payer: Self-pay | Admitting: Advanced Practice Midwife

## 2022-03-15 ENCOUNTER — Other Ambulatory Visit (HOSPITAL_COMMUNITY)
Admission: RE | Admit: 2022-03-15 | Discharge: 2022-03-15 | Disposition: A | Discharge status: Home / Self Care | Payer: No Typology Code available for payment source | Source: Ambulatory Visit | Attending: Advanced Practice Midwife | Admitting: Advanced Practice Midwife

## 2022-03-15 VITALS — BP 119/77 | HR 85 | Ht 62.0 in | Wt 145.0 lb

## 2022-03-15 DIAGNOSIS — Z124 Encounter for screening for malignant neoplasm of cervix: Secondary | ICD-10-CM

## 2022-03-15 DIAGNOSIS — Z1239 Encounter for other screening for malignant neoplasm of breast: Secondary | ICD-10-CM

## 2022-03-15 DIAGNOSIS — N951 Menopausal and female climacteric states: Secondary | ICD-10-CM

## 2022-03-15 DIAGNOSIS — Z01419 Encounter for gynecological examination (general) (routine) without abnormal findings: Secondary | ICD-10-CM

## 2022-03-15 NOTE — Progress Notes (Signed)
Gynecology Annual Exam  PCP: Jeanie Sewer, NP  Chief Complaint:  Chief Complaint  Patient presents with   Annual Exam    History of Present Illness: Patient is a 51 y.o. [id number] presents for annual exam. The patient has complaints today of mood changes and some hot flashes in the past year and she wonders if these are due to perimenopause. We had a discussion regarding symptoms and recommended treatments. She is frequently irritable. She reports her mother and sister both were post menopausal in their mid 69s.   LMP: Patient's last menstrual period was 02/28/2022 (approximate). Average Interval: regular, 28 days- some minor changes in interval Duration of flow: 4 days Heavy Menses: 1 heavy day Clots: no Intermenstrual Bleeding: no Postcoital Bleeding: no Dysmenorrhea: no   The patient is infrequently sexually active. She currently uses none for contraception.  She denies dyspareunia.  The patient does perform self breast exams.  There is no notable family history of breast or ovarian cancer in her family.  The patient wears seatbelts: yes.   The patient has regular exercise:  she has limited exercise and reports when she does walk she feels worse after with swollen and painful joints. She had a negative workup last year for auto immune disorders. She reports good diet and hydration. Her sleep is interrupted and she doesn't usually feel rested .    The patient denies current symptoms of depression.    Review of Systems: Review of Systems  Constitutional:  Negative for chills and fever.  HENT:  Negative for congestion, ear discharge, ear pain, hearing loss, sinus pain and sore throat.   Eyes:  Negative for blurred vision and double vision.  Respiratory:  Negative for cough, shortness of breath and wheezing.   Cardiovascular:  Negative for chest pain, palpitations and leg swelling.  Gastrointestinal:  Negative for abdominal pain, blood in stool, constipation, diarrhea,  heartburn, melena, nausea and vomiting.  Genitourinary:  Negative for dysuria, flank pain, frequency, hematuria and urgency.  Musculoskeletal:  Positive for joint pain. Negative for back pain and myalgias.  Skin:  Negative for itching and rash.  Neurological:  Negative for dizziness, tingling, tremors, sensory change, speech change, focal weakness, seizures, loss of consciousness, weakness and headaches.  Endo/Heme/Allergies:  Negative for environmental allergies. Does not bruise/bleed easily.  Psychiatric/Behavioral:  Negative for depression, hallucinations, memory loss, substance abuse and suicidal ideas. The patient is not nervous/anxious and does not have insomnia.        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 Failed Pamelor d/t tachycardia, cymbalta did not help. Taking Tramadol tid, states this is only med that helps her daily pain, muscle relaxers help for acute on chronic pain, Elavil helps also. Pt has seen [facility] and have discussed different options including epidural injections and nerve ablation. Pt is very undecided, recommended getting 2nd opinion from a neurosurgeon.    Chronic constipation 02/11/2018   Raynauds phenomenon 10/02/2017   Migraines 10/02/2017   Fibromyalgia 08/02/2016    Side effects with Cymbalta and Lyrica  (mood changes ),  Pamelor(Caused tachycardia) Currently on Elavil, robaxin, Ultram, Naprosyn  pt uses guifenesin for her fibromyalgia (off label use but finds it helpful) - Rx written for dye free syrup    Tachycardia 08/02/2016    Eczema, allergic 05/12/2015   Environmental allergies 05/12/2015    Past Surgical History:  History reviewed. No pertinent surgical history.  Gynecologic History:  Patient's last menstrual period was 02/28/2022 (approximate). Contraception: none Last Pap: 2019 Results were:  no abnormalities  Last mammogram: 2021 Results were: BI-RAD III likely benign  Obstetric History: [id number]  Family History:  Family History  Problem Relation Age of Onset   Hypertension Mother    Depression Mother    Stroke Mother    Rectal cancer Father    Cancer Father        bladder cancer, rectal cancer   Atrial fibrillation Father    Hypertension Sister    Depression Sister    Mental illness Brother        suicide   Alcohol abuse Brother    Diabetes Maternal Grandmother    Macular degeneration Maternal Grandmother    Dementia Maternal Grandfather    Esophageal cancer Neg Hx    Inflammatory bowel disease Neg Hx    Liver disease Neg Hx    Pancreatic cancer Neg Hx    Stomach cancer Neg Hx    Colon cancer Neg Hx     Social History:  Social History   Socioeconomic History   Marital status: Married    Spouse name: Not on file   Number of children: 2   Years of education: Not on file   Highest education level: Not on file  Occupational History   Occupation: RN  Tobacco Use   Smoking status: Former    Packs/day: 1.00    Years: 12.00    Total pack years: 12.00    Types: Cigarettes    Quit date: 05/23/1999    Years since quitting: 22.8   Smokeless tobacco: Never  Vaping Use   Vaping Use: Never used  Substance and Sexual Activity   Alcohol use: No    Alcohol/week: 0.0 standard drinks of alcohol    Comment: beer   Drug use: No   Sexual activity: Yes    Birth control/protection: None    Comment: divorced, remarried, 2 kids, RN  Other Topics Concern   Not on file  Social History Narrative   Not on file   Social Determinants of Health   Financial Resource Strain: Not on file  Food  Insecurity: Not on file  Transportation Needs: Not on file  Physical Activity: Not on file  Stress: Not on file  Social Connections: Not on file  Intimate Partner Violence: Not on file    Allergies:  Allergies  Allergen Reactions   Ampicillin    Cymbalta [Duloxetine Hcl]    Latex Itching   Lyrica [Pregabalin]    Zantac [Ranitidine Hcl]     Tachycardia    Medications: Prior to Admission medications   Medication Sig Start Date End Date Taking? Authorizing Provider  amitriptyline (ELAVIL) 75 MG tablet Take 1 tablet (75 mg total) by mouth at bedtime. 03/07/22  Yes Hudnell, Colletta [place], NP  Cholecalciferol (VITAMIN D) 125 MCG (5000 UT) CAPS Take by mouth.   Yes [provider]  guaiFENesin (MUCINEX) 600 MG 12 hr tablet Take 600 mg by mouth 2 (two) times daily. PRN - pt takes for fibromyalgia sx, off label   Yes [provider]  melatonin 5 MG  TABS Take 5 mg by mouth at bedtime.   Yes [provider]  methocarbamol (ROBAXIN) 750 MG tablet Take 1 tablet by mouth every 8 hours as needed for muscle spasms. 03/07/22  Yes Jeanie Sewer, NP  metoprolol succinate (TOPROL-XL) 25 MG 24 hr tablet Take 1/2 tablet by mouth every evening and 1 tablet every morning. 03/07/22  Yes Jeanie Sewer, NP  Multiple Vitamin (MULTIVITAMIN) tablet Take 1 tablet by mouth daily.   Yes [provider]  NAPROXEN PO Take by mouth as needed.   Yes [provider]  polyethylene glycol (MIRALAX / GLYCOLAX) packet Take 17 g by mouth daily.   Yes [provider]  SUMAtriptan (IMITREX) 100 MG tablet TAKE 1 TABLET BY MOUTH EVERY 2 HOURS AS NEEDED AS DIRECTED 12/21/20  Yes Noemi Chapel A, NP  traMADol (ULTRAM) 50 MG tablet Take 1 tablet by mouth every 6 hours as needed for severe pain. **Each refill must last 30 days. 03/07/22  Yes Jeanie Sewer, NP    Physical Exam Vitals: Blood pressure 119/77, pulse 85, height '5\' 2"'$  (1.575 m), weight 145 lb (65.8 kg),  last menstrual period 02/28/2022.  General: NAD HEENT: normocephalic, anicteric Thyroid: no enlargement, no palpable nodules Pulmonary: No increased work of breathing, CTAB Cardiovascular: RRR, distal pulses 2+ Breast: Breast symmetrical, no tenderness, no palpable nodules or masses, no skin or nipple retraction present, no nipple discharge.  No axillary or supraclavicular lymphadenopathy. Abdomen: NABS, soft, non-tender, non-distended.  Umbilicus without lesions.  No hepatomegaly, splenomegaly or masses palpable. No evidence of hernia  Genitourinary:  External: Normal external female genitalia.  Normal urethral meatus, normal Bartholin's and Skene's glands.    Vagina: Normal vaginal mucosa, no evidence of prolapse. Decreased muscle strength  Cervix: Grossly normal in appearance, no bleeding Extremities: no edema, erythema, or tenderness Neurologic: Grossly intact Psychiatric: mood appropriate, affect full   Assessment: 51 y.o. [id number] routine annual exam  Plan: Problem List Items Addressed This Visit   None Visit Diagnoses     Well woman exam with routine gynecological exam    -  Primary   Relevant Orders   FSH/LH   Estradiol   Progesterone   Cytology - PAP   Cervical cancer screening       Relevant Orders   Cytology - PAP   Perimenopausal symptoms       Relevant Orders   FSH/LH   Estradiol   Progesterone   Breast screening       Relevant Orders   MM 3D SCREEN BREAST BILATERAL       1) Mammogram - recommend yearly screening mammogram.  Mammogram Was ordered today  2) STI screening  was offered and declined  3) ASCCP guidelines and rationale discussed.  Patient opts for every 5 years screening interval if normal PAP today  4) Contraception - the patient is currently using  none.  She is happy with her current form of contraception and plans to continue  5) Colonoscopy: up to date -- Screening recommended starting at age 89 for average risk individuals, age 41 for  individuals deemed at increased risk (including African Americans) and recommended to continue until age 29.  For patient age 19-85 individualized approach is recommended.  Gold standard screening is via colonoscopy, Cologuard screening is an acceptable alternative for patient unwilling or unable to undergo colonoscopy.  "Colorectal cancer screening for average?risk adults: 2018 guideline update from the American Cancer Society"CA: A Cancer Journal for Clinicians: Oct 18, 2016   6) Routine  healthcare maintenance including cholesterol, diabetes screening discussed managed by PCP  7) Increase healthy lifestyle; exercise, sleep, stress reducing activities  8) Return in about 1 year (around 03/16/2023) for annual established gyn.   Rod Can, [facility] 03/15/2022, 5:17 PM

## 2022-03-15 NOTE — Patient Instructions (Signed)
Perimenopause Perimenopause is the normal time of a woman's life when the levels of estrogen, the female hormone produced by the ovaries, begin to decrease. This leads to changes in menstrual periods before they stop completely (menopause). Perimenopause can begin 2-8 years before menopause. During perimenopause, the ovaries may or may not produce an egg and a woman can still become pregnant. What are the causes? This condition is caused by a natural change in hormone levels that happens as you get older. What increases the risk? This condition is more likely to start at an earlier age if you have certain medical conditions or have undergone treatments, including: A tumor of the pituitary gland in the brain. A disease that affects the ovaries and hormone production. Certain cancer treatments, such as chemotherapy or hormone therapy, or radiation therapy on the pelvis. Heavy smoking and excessive alcohol use. Family history of early menopause. What are the signs or symptoms? Perimenopausal changes affect each woman differently. Symptoms of this condition may include: Hot flashes. Irregular menstrual periods. Night sweats. Changes in feelings about sex. This could be a decrease in sex drive or an increased discomfort around your sexuality. Vaginal dryness. Headaches. Mood swings. Depression. Problems sleeping (insomnia). Memory problems or trouble concentrating. Irritability. Tiredness. Weight gain. Anxiety. Trouble getting pregnant. How is this diagnosed? This condition is diagnosed based on your medical history, a physical exam, your age, your menstrual history, and your symptoms. Hormone tests may also be done. How is this treated? In some cases, no treatment is needed. You and your health care provider should make a decision together about whether treatment is necessary. Treatment will be based on your individual condition and preferences. Various treatments are available, such  as: Menopausal hormone therapy (MHT). Medicines to treat specific symptoms. Acupuncture. Vitamin or herbal supplements. Before starting treatment, make sure to let your health care provider know if you have a personal or family history of: Heart disease. Breast cancer. Blood clots. Diabetes. Osteoporosis. Follow these instructions at home: Medicines Take over-the-counter and prescription medicines only as told by your health care provider. Take vitamin supplements only as told by your health care provider. Talk with your health care provider before starting any herbal supplements. Lifestyle  Do not use any products that contain nicotine or tobacco, such as cigarettes, e-cigarettes, and chewing tobacco. If you need help quitting, ask your health care provider. Get at least 30 minutes of physical activity on 5 or more days each week. Eat a balanced diet that includes fresh fruits and vegetables, whole grains, soybeans, eggs, lean meat, and low-fat dairy. Avoid alcoholic and caffeinated beverages, as well as spicy foods. This may help prevent hot flashes. Get 7-8 hours of sleep each night. Dress in layers that can be removed to help you manage hot flashes. Find ways to manage stress, such as deep breathing, meditation, or journaling. General instructions  Keep track of your menstrual periods, including: When they occur. How heavy they are and how long they last. How much time passes between periods. Keep track of your symptoms, noting when they start, how often you have them, and how long they last. Use vaginal lubricants or moisturizers to help with vaginal dryness and improve comfort during sex. You can still become pregnant if you are having irregular periods. Make sure you use contraception during perimenopause if you do not want to get pregnant. Keep all follow-up visits. This is important. This includes any group therapy or counseling. Contact a health care provider if: You  have   heavy vaginal bleeding or pass blood clots. Your period lasts more than 2 days longer than normal. Your periods are recurring sooner than 21 days. You bleed after having sex. You have pain during sex. Get help right away if you have: Chest pain, trouble breathing, or trouble talking. Severe depression. Pain when you urinate. Severe headaches. Vision problems. Summary Perimenopause is the time when a woman's body begins to move into menopause. This may happen naturally or as a result of other health problems or medical treatments. Perimenopause can begin 2-8 years before menopause, and it can last for several years. Perimenopausal symptoms can be managed through medicines, lifestyle changes, and complementary therapies such as acupuncture. This information is not intended to replace advice given to you by your health care provider. Make sure you discuss any questions you have with your health care provider. Document Revised: 10/23/2019 Document Reviewed: 10/23/2019 Elsevier Patient Education  2023 Elsevier Inc.  

## 2022-03-16 LAB — FSH/LH
FSH: 2.8 m[IU]/mL
LH: 2.5 m[IU]/mL

## 2022-03-16 LAB — ESTRADIOL: Estradiol: 159 pg/mL

## 2022-03-16 LAB — PROGESTERONE: Progesterone: 12.7 ng/mL

## 2022-03-17 LAB — CYTOLOGY - PAP
Comment: NEGATIVE
Diagnosis: NEGATIVE
High risk HPV: NEGATIVE

## 2022-04-08 ENCOUNTER — Other Ambulatory Visit (HOSPITAL_COMMUNITY): Payer: Self-pay

## 2022-04-10 ENCOUNTER — Other Ambulatory Visit (HOSPITAL_COMMUNITY): Payer: Self-pay

## 2022-04-11 ENCOUNTER — Other Ambulatory Visit (HOSPITAL_COMMUNITY): Payer: Self-pay

## 2022-04-17 ENCOUNTER — Other Ambulatory Visit (HOSPITAL_COMMUNITY): Payer: Self-pay

## 2022-05-09 ENCOUNTER — Other Ambulatory Visit (HOSPITAL_COMMUNITY): Payer: Self-pay

## 2022-05-10 ENCOUNTER — Other Ambulatory Visit (HOSPITAL_COMMUNITY): Payer: Self-pay

## 2022-05-10 ENCOUNTER — Other Ambulatory Visit: Payer: Self-pay

## 2022-06-04 ENCOUNTER — Other Ambulatory Visit: Payer: Self-pay | Admitting: Nurse Practitioner

## 2022-06-05 ENCOUNTER — Other Ambulatory Visit: Payer: Self-pay

## 2022-06-05 ENCOUNTER — Other Ambulatory Visit (HOSPITAL_COMMUNITY): Payer: Self-pay

## 2022-06-08 ENCOUNTER — Other Ambulatory Visit: Payer: Self-pay | Admitting: Nurse Practitioner

## 2022-06-09 ENCOUNTER — Other Ambulatory Visit: Payer: Self-pay

## 2022-06-09 ENCOUNTER — Other Ambulatory Visit (HOSPITAL_COMMUNITY): Payer: Self-pay

## 2022-06-09 NOTE — Telephone Encounter (Signed)
No longer under prescribers care- Sent to Jeanie Sewer NP  Requested Prescriptions  Refused Prescriptions Disp Refills   SUMAtriptan (IMITREX) 100 MG tablet 9 tablet 3    Sig: TAKE 1 TABLET BY MOUTH EVERY 2 HOURS AS NEEDED AS DIRECTED     Neurology:  Migraine Therapy - Triptan Failed - 06/08/2022 11:26 PM      Failed - Valid encounter within last 12 months    Recent Outpatient Visits           1 year ago [place] Eulogio Bear, NP   1 year ago [place], NP   2 years ago Routine general medical examination at a health care facility   [place], [place] Nunnery, MD   3 years ago Routine general medical examination at a health care facility   [place], [place] Nunnery, MD   3 years ago Essential hypertension   [place], [place] Nunnery, MD              Passed - Last BP in normal range    BP Readings from Last 1 Encounters:  03/15/22 119/77

## 2022-06-14 ENCOUNTER — Other Ambulatory Visit (HOSPITAL_COMMUNITY): Payer: Self-pay

## 2022-06-14 ENCOUNTER — Other Ambulatory Visit: Payer: Self-pay | Admitting: Family

## 2022-06-16 MED ORDER — SUMATRIPTAN SUCCINATE 100 MG PO TABS
ORAL_TABLET | ORAL | 3 refills | Status: DC
  Filled 2022-06-16: qty 9, 30d supply, fill #0
  Filled 2022-07-12 – 2022-09-10 (×2): qty 9, 30d supply, fill #1
  Filled 2023-02-25: qty 9, 30d supply, fill #2
  Filled 2023-04-08: qty 9, 30d supply, fill #3

## 2022-06-16 NOTE — Telephone Encounter (Signed)
Is patient requesting the refill? have never discussed her migraines before.

## 2022-06-17 ENCOUNTER — Other Ambulatory Visit (HOSPITAL_COMMUNITY): Payer: Self-pay

## 2022-06-19 ENCOUNTER — Other Ambulatory Visit (HOSPITAL_COMMUNITY): Payer: Self-pay

## 2022-06-19 ENCOUNTER — Encounter: Payer: Self-pay | Admitting: Family

## 2022-06-19 ENCOUNTER — Ambulatory Visit (INDEPENDENT_AMBULATORY_CARE_PROVIDER_SITE_OTHER): Payer: 59 | Admitting: Family

## 2022-06-19 ENCOUNTER — Other Ambulatory Visit: Payer: Self-pay

## 2022-06-19 VITALS — BP 113/77 | HR 80 | Temp 97.4°F | Ht 62.0 in | Wt 148.5 lb

## 2022-06-19 DIAGNOSIS — G894 Chronic pain syndrome: Secondary | ICD-10-CM

## 2022-06-19 DIAGNOSIS — M7918 Myalgia, other site: Secondary | ICD-10-CM | POA: Diagnosis not present

## 2022-06-19 DIAGNOSIS — M797 Fibromyalgia: Secondary | ICD-10-CM | POA: Diagnosis not present

## 2022-06-19 DIAGNOSIS — Z0001 Encounter for general adult medical examination with abnormal findings: Secondary | ICD-10-CM | POA: Diagnosis not present

## 2022-06-19 DIAGNOSIS — R400 Somnolence: Secondary | ICD-10-CM

## 2022-06-19 LAB — CBC WITH DIFFERENTIAL/PLATELET
Basophils Absolute: 0.1 10*3/uL (ref 0.0–0.1)
Basophils Relative: 0.9 % (ref 0.0–3.0)
Eosinophils Absolute: 0.2 10*3/uL (ref 0.0–0.7)
Eosinophils Relative: 2.3 % (ref 0.0–5.0)
HCT: 41.2 % (ref 36.0–46.0)
Hemoglobin: 13.9 g/dL (ref 12.0–15.0)
Lymphocytes Relative: 36.8 % (ref 12.0–46.0)
Lymphs Abs: 3 10*3/uL (ref 0.7–4.0)
MCHC: 33.7 g/dL (ref 30.0–36.0)
MCV: 87.4 fl (ref 78.0–100.0)
Monocytes Absolute: 0.5 10*3/uL (ref 0.1–1.0)
Monocytes Relative: 6.3 % (ref 3.0–12.0)
Neutro Abs: 4.4 10*3/uL (ref 1.4–7.7)
Neutrophils Relative %: 53.7 % (ref 43.0–77.0)
Platelets: 346 10*3/uL (ref 150.0–400.0)
RBC: 4.72 Mil/uL (ref 3.87–5.11)
RDW: 13.2 % (ref 11.5–15.5)
WBC: 8.1 10*3/uL (ref 4.0–10.5)

## 2022-06-19 LAB — LIPID PANEL
Cholesterol: 162 mg/dL (ref 0–200)
HDL: 62.8 mg/dL (ref >39.00)
LDL Cholesterol: 80 mg/dL (ref 0–99)
NonHDL: 99.12
Total CHOL/HDL Ratio: 3
Triglycerides: 97 mg/dL (ref 0.0–149.0)
VLDL: 19.4 mg/dL (ref 0.0–40.0)

## 2022-06-19 LAB — COMPREHENSIVE METABOLIC PANEL WITH GFR
ALT: 22 U/L (ref 0–35)
AST: 17 U/L (ref 0–37)
Albumin: 4.1 g/dL (ref 3.5–5.2)
Alkaline Phosphatase: 62 U/L (ref 39–117)
BUN: 11 mg/dL (ref 6–23)
CO2: 28 meq/L (ref 19–32)
Calcium: 8.9 mg/dL (ref 8.4–10.5)
Chloride: 103 meq/L (ref 96–112)
Creatinine, Ser: 0.72 mg/dL (ref 0.40–1.20)
GFR: 97.63 mL/min
Glucose, Bld: 98 mg/dL (ref 70–99)
Potassium: 4.3 meq/L (ref 3.5–5.1)
Sodium: 140 meq/L (ref 135–145)
Total Bilirubin: 0.4 mg/dL (ref 0.2–1.2)
Total Protein: 6.3 g/dL (ref 6.0–8.3)

## 2022-06-19 LAB — TSH: TSH: 2.49 u[IU]/mL (ref 0.35–5.50)

## 2022-06-19 MED ORDER — TRAMADOL HCL 50 MG PO TABS
50.0000 mg | ORAL_TABLET | Freq: Four times a day (QID) | ORAL | 3 refills | Status: DC | PRN
  Filled 2022-06-19 – 2022-07-12 (×2): qty 120, 30d supply, fill #0
  Filled 2022-08-07 – 2022-08-10 (×2): qty 120, 30d supply, fill #1
  Filled 2022-09-26: qty 120, 30d supply, fill #2

## 2022-06-19 MED ORDER — METHOCARBAMOL 750 MG PO TABS
750.0000 mg | ORAL_TABLET | Freq: Three times a day (TID) | ORAL | 1 refills | Status: DC | PRN
  Filled 2022-06-19 – 2022-09-05 (×2): qty 180, 60d supply, fill #0
  Filled 2022-11-24: qty 180, 60d supply, fill #1

## 2022-06-19 MED ORDER — AMITRIPTYLINE HCL 75 MG PO TABS
75.0000 mg | ORAL_TABLET | Freq: Every day | ORAL | 1 refills | Status: DC
  Filled 2022-06-19 – 2022-08-30 (×2): qty 90, 90d supply, fill #0
  Filled 2022-11-24: qty 90, 90d supply, fill #1

## 2022-06-19 MED ORDER — MODAFINIL 100 MG PO TABS
100.0000 mg | ORAL_TABLET | Freq: Every day | ORAL | 2 refills | Status: DC
  Filled 2022-06-19: qty 30, 30d supply, fill #0
  Filled 2022-07-12 – 2022-07-17 (×2): qty 30, 30d supply, fill #1
  Filled 2022-08-14: qty 30, 30d supply, fill #2

## 2022-06-19 NOTE — Assessment & Plan Note (Signed)
chronic taking Tramadol, Robaxin, Mucinex (fibromyalgia -off label) sending refills today f/u in 4 mos.

## 2022-06-19 NOTE — Progress Notes (Signed)
Phone [phone]  Subjective:   Patient is a 52 y.o. female presenting for annual physical.    Chief Complaint  Patient presents with   Annual Exam    Fasting w/ Labs   Migraine   HPI: Fatigue:  persistent problem for over a year. Sleeps well most nights with Melatonin & Elavil. Reports she has to take a nap every day in the middle of the day or she can't stay awake with caffeine alone. Medications contributing are Metoprolol, & Tramadol.    Pain: She reports chronic back pain. was not an injury that may have caused the pain. The pain started years ago and is staying constant. The pain does not radiate. The pain is described as aching, burning, tingling, soreness, stiffness, and throbbing, occurring every day. Symptoms are worse in the: all day & night.  Aggravating factors: sitting, standing, walking, and lifting, pulling. Relieving factors: medication Tramadol, Naproxen, Elavil, muscle relaxers and rest. She has tried application of heat, application of ice, acetaminophen, NSAIDs, topical anesthetics, physical therapy, and message therapy with little relief, reports she does get relief with therapeutic massage, but does not get more often d/t expense.   See problem oriented charting- ROS- full  review of systems was completed and negative except for HPI above.   The following were reviewed and entered/updated in epic: Past Medical History:  Diagnosis Date   Atopic dermatitis 12/06/2020   Bloating 02/11/2018   Chronic pain syndrome 12/06/2020   Chronic pain syndrome 12/06/2020   Chronic thoracic back pain (1ry area of Pain) (Bilateral) (L>R) 12/06/2020   Chronic upper back pain (Bilateral) 02/07/2021   Contact dermatitis and other eczema due to other chemical products 12/06/2020   Disorder of skeletal system 12/06/2020   Fibromyalgia    Hypertension    boderline   IBS (irritable bowel syndrome)    Other chronic allergic conjunctivitis 12/06/2020   -patient with periocular pruritis  and epiphora but with very few nasal symptoms -will give trial of zaditor eye dropds prn  -given h/o severe atopic derm and periocular skin involvement cannot r/o atopic keratoconjunctivitis and thus will refer to derm   Pharmacologic therapy 12/06/2020   Problems influencing health status 12/06/2020   Thoracic facet syndrome (Bilateral) 02/07/2021   Patient Active Problem List   Diagnosis Date Noted   Daytime somnolence 06/19/2022   Chronic fatigue 08/26/2021   Thoracic facet hypertrophy 06/01/2021   Spondylosis without myelopathy or radiculopathy, 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  Family History  Problem Relation Age of Onset   Hypertension Mother    Depression Mother    Stroke Mother    Rectal cancer Father    Cancer Father        bladder cancer, rectal cancer   Atrial fibrillation Father    Hypertension Sister    Depression Sister    Mental illness Brother        suicide   Alcohol abuse Brother    Diabetes Maternal Grandmother    Macular degeneration Maternal Grandmother    Dementia Maternal Grandfather    Esophageal cancer Neg Hx    Inflammatory bowel disease Neg Hx    Liver disease Neg Hx    Pancreatic cancer Neg Hx  Stomach cancer Neg Hx    Colon cancer Neg Hx     Medications- reviewed and updated Current Outpatient Medications  Medication Sig Dispense Refill   Cholecalciferol (VITAMIN D) 125 MCG (5000 UT) CAPS Take by mouth.     guaiFENesin (MUCINEX) 600 MG 12 hr tablet Take 600 mg by mouth 2 (two) times daily. PRN - pt takes for fibromyalgia sx, off label     melatonin 5 MG TABS Take 5 mg by mouth at bedtime.     metoprolol succinate (TOPROL-XL) 25 MG 24 hr tablet  Take 1/2 tablet by mouth every evening and 1 tablet every morning. 135 tablet 1   modafinil (PROVIGIL) 100 MG tablet Take 1 tablet (100 mg total) by mouth daily. 30 tablet 2   Multiple Vitamin (MULTIVITAMIN) tablet Take 1 tablet by mouth daily.     NAPROXEN PO Take by mouth as needed.     polyethylene glycol (MIRALAX / GLYCOLAX) packet Take 17 g by mouth daily.     SUMAtriptan (IMITREX) 100 MG tablet TAKE 1 TABLET BY MOUTH EVERY 2 HOURS AS NEEDED AS DIRECTED 9 tablet 3   amitriptyline (ELAVIL) 75 MG tablet Take 1 tablet (75 mg total) by mouth at bedtime. 90 tablet 1   methocarbamol (ROBAXIN) 750 MG tablet Take 1 tablet by mouth every 8 hours as needed for muscle spasms. 180 tablet 1   traMADol (ULTRAM) 50 MG tablet Take 1 tablet by mouth every 6 hours as needed for severe pain. **Each refill must last 30 days. 120 tablet 3   No current facility-administered medications for this visit.    Allergies-reviewed and updated Allergies  Allergen Reactions   Ampicillin    Cymbalta [Duloxetine Hcl]    Latex Itching   Lyrica [Pregabalin]    Zantac [Ranitidine Hcl]     Tachycardia    Social History   Social History Narrative   Not on file    Objective:  BP 113/77 (BP Location: Left Arm, Patient Position: Sitting, Cuff Size: Large)   Pulse 80   Temp (!) 97.4 F (36.3 C) (Temporal)   Ht '5\' 2"'$  (1.575 m)   Wt 148 lb 8 oz (67.4 kg)   LMP 06/11/2022 (Exact Date)   SpO2 98%   BMI 27.16 kg/m  Physical Exam Vitals and nursing note reviewed.  Constitutional:      Appearance: Normal appearance.  HENT:     Head: Normocephalic.     Right Ear: Tympanic membrane normal.     Left Ear: Tympanic membrane normal.     Nose: Nose normal.     Mouth/Throat:     Mouth: Mucous membranes are moist.  Eyes:     Pupils: Pupils are equal, round, and reactive to light.  Cardiovascular:     Rate and Rhythm: Normal rate and regular rhythm.  Pulmonary:     Effort: Pulmonary effort is normal.     Breath  sounds: Normal breath sounds.  Musculoskeletal:        General: Normal range of motion.     Cervical back: Normal range of motion.  Lymphadenopathy:     Cervical: No cervical adenopathy.  Skin:    General: Skin is warm and dry.  Neurological:     Mental Status: She is alert.  Psychiatric:        Mood and Affect: Mood normal.        Behavior: Behavior normal.      Assessment and Plan   Health Maintenance counseling: 1. Anticipatory  guidance: Patient counseled regarding regular dental exams q6 months, eye exams,  avoiding smoking and second hand smoke, limiting alcohol to 1 beverage per day, no illicit drugs.   2. Risk factor reduction:  Advised patient of need for regular exercise and diet rich with fruits and vegetables to reduce risk of heart attack and stroke. Exercise- none.  Wt Readings from Last 3 Encounters:  06/19/22 148 lb 8 oz (67.4 kg)  03/15/22 145 lb (65.8 kg)  03/07/22 144 lb (65.3 kg)   3. Immunizations/screenings/ancillary studies Immunization History  Administered Date(s) Administered   Hepatitis A 12/02/2009   Hepatitis B 05/22/1992   Influenza Split 03/05/2013, 02/20/2015   Influenza Whole 04/28/2002   Influenza,inj,Quad PF,6+ Mos 02/20/2019, 03/10/2020   Influenza-Unspecified 02/19/2014, 02/13/2020, 02/19/2021, 03/01/2022   MMR 06/29/2003, 05/26/2004   PFIZER(Purple Top)SARS-COV-2 Vaccination 05/29/2019, 06/17/2019   Tdap 05/22/1996, 04/18/2005, 04/30/2012   Health Maintenance Due  Topic Date Due   Zoster Vaccines- Shingrix (1 of 2) Never done   COVID-19 Vaccine (3 - Pfizer risk series) 07/15/2019   MAMMOGRAM  07/08/2021   DTaP/Tdap/Td (4 - Td or Tdap) 04/30/2022    4. Cervical cancer screening- due 2028 5. Breast cancer screening-  mammogram due, pt will call [place] mammogram 6. Colon cancer screening - advised to f/u 63yr7. Skin cancer screening- advised regular sunscreen use. Denies worrisome, changing, or new skin lesions.  8. Birth  control/STD check- N/A  9. Osteoporosis screening- N/A 10. Alcohol screening: none 11. Smoking associated screening (lung cancer screening, AAA screen 65-75, UA)- non-smoker  Problem List Items Addressed This Visit       Musculoskeletal and Integument   Myofascial pain syndrome of thoracic spine (Chronic)    chronic taking Tramadol, Robaxin, Mucinex (fibromyalgia) refilling meds today f/u in 4 mos       Relevant Medications   traMADol (ULTRAM) 50 MG tablet   methocarbamol (ROBAXIN) 750 MG tablet   amitriptyline (ELAVIL) 75 MG tablet     Other   Fibromyalgia (Chronic)    chronic taking Tramadol, Robaxin, Mucinex (fibromyalgia -off label) sending refills today f/u in 4 mos.       Relevant Medications   traMADol (ULTRAM) 50 MG tablet   methocarbamol (ROBAXIN) 750 MG tablet   amitriptyline (ELAVIL) 75 MG tablet   Daytime somnolence    New, r/t fatigue, fibromyalgia denies regular issue with sleep, low risk for sleep apnea sending trial of Modafinil '100mg'$  qd advised on use & SE, minimize caffeine intake  f/u 1 month      Relevant Medications   modafinil (PROVIGIL) 100 MG tablet   Other Visit Diagnoses     Encounter for general adult medical examination with abnormal findings    -  Primary   Relevant Orders   Comprehensive metabolic panel   TSH   Lipid panel   CBC with Differential/Platelet   Chronic pain syndrome       Relevant Medications   traMADol (ULTRAM) 50 MG tablet   methocarbamol (ROBAXIN) 750 MG tablet   amitriptyline (ELAVIL) 75 MG tablet       Recommended follow up:  No follow-ups on file. No future appointments.   Lab/Order associations: fasting   HJeanie Sewer NP

## 2022-06-19 NOTE — Assessment & Plan Note (Signed)
chronic taking Tramadol, Robaxin, Mucinex (fibromyalgia) refilling meds today f/u in 4 mos

## 2022-06-19 NOTE — Assessment & Plan Note (Addendum)
New, r/t fatigue, fibromyalgia denies regular issue with sleep, low risk for sleep apnea sending trial of Modafinil '100mg'$  qd advised on use & SE, minimize caffeine intake  f/u 1 month

## 2022-06-20 ENCOUNTER — Other Ambulatory Visit (HOSPITAL_COMMUNITY): Payer: Self-pay

## 2022-06-23 NOTE — Progress Notes (Signed)
All of your labs are within normal range.  Glucose (sugar), electrolytes, kidney and liver function, blood count, thyroid, and cholesterol numbers are all good.   Keep up the good work with controlling your diet and continue to try and  exercise when able.

## 2022-07-12 ENCOUNTER — Other Ambulatory Visit: Payer: Self-pay

## 2022-07-12 ENCOUNTER — Other Ambulatory Visit (HOSPITAL_COMMUNITY): Payer: Self-pay

## 2022-07-13 ENCOUNTER — Other Ambulatory Visit (HOSPITAL_COMMUNITY): Payer: Self-pay

## 2022-07-13 ENCOUNTER — Other Ambulatory Visit: Payer: Self-pay

## 2022-07-13 ENCOUNTER — Encounter (HOSPITAL_COMMUNITY): Payer: Self-pay

## 2022-07-17 ENCOUNTER — Other Ambulatory Visit: Payer: Self-pay

## 2022-07-25 ENCOUNTER — Other Ambulatory Visit (HOSPITAL_COMMUNITY): Payer: Self-pay

## 2022-07-25 ENCOUNTER — Telehealth: Payer: 59 | Admitting: Family Medicine

## 2022-07-25 DIAGNOSIS — B86 Scabies: Secondary | ICD-10-CM

## 2022-07-25 MED ORDER — IVERMECTIN 3 MG PO TABS
200.0000 ug/kg | ORAL_TABLET | Freq: Once | ORAL | 0 refills | Status: AC
  Filled 2022-07-25: qty 5, 1d supply, fill #0

## 2022-07-25 NOTE — Progress Notes (Signed)
E-Visit for Scabies  We are sorry that you are not feeling well. Here is how we plan to help!  Based on what you shared with me it looks like you have scabies.  Scabies is an infection caused by very tiny mites that burrow into the skin.  They cause severe itching. Though children are most commonly infected, anyone can get scabies.  Scabies mites can pass from person to person through physical close contact.  They can also be passed through shared clothing, towels, and bedding.  Scabies infection is not usually dangerous, but it is uncomfortable.  Because it is so contagious, scabies should be treated immediately to keep the infection from spreading.  If you have children in day care, please have any exposed children examined by their pediatrician. .  I have ordered the Ivermectin for you- as per current weight you provided of 150lbs, as it is weight based. We hope this helps.   HOME CARE:  You should treat all members in your household whether they show symptoms or not. Wash towels, clothes, bed linens, cloth toys, hats, and other personal items in hot water and dry on high heat. Vacuum floors and furniture and throw away the bag afterwards. Keep your child home from day care or school until the morning after treatment for scabies. Notify your child's day care or school so that other children can be checked and treated.  GET HELP RIGHT AWAY IF:  The infected person has a fever, red streaks, pain, or swelling of the skin. Sores get worse or don't heal. New rashes appear or itching continues for more than 2 weeks after treatment.  MAKE SURE YOU:  Understand these instructions. Will watch your condition. Will get help right away if you are not doing well or get worse.  Thank you for choosing an e-visit.  Your e-visit answers were reviewed by a board certified advanced clinical practitioner to complete your personal care plan. Depending upon the condition, your plan could have included  both over the counter or prescription medications.  Please review your pharmacy choice. Make sure the pharmacy is open so you can pick up prescription now. If there is a problem, you may contact your provider through [employer] and have the prescription routed to another pharmacy.  Your safety is important to [place]. If you have drug allergies check your prescription carefully.   For the next 24 hours you can use MyChart to ask questions about today's visit, request a non-urgent call back, or ask for a work or school excuse. You will get an email in the next two days asking about your experience. I hope that your e-visit has been valuable and will speed your recovery.   I provided 5 minutes of non face-to-face time during this encounter for chart review, medication and order placement, as well as and documentation.

## 2022-08-02 DIAGNOSIS — H5213 Myopia, bilateral: Secondary | ICD-10-CM | POA: Diagnosis not present

## 2022-08-07 ENCOUNTER — Other Ambulatory Visit (HOSPITAL_COMMUNITY): Payer: Self-pay

## 2022-08-10 ENCOUNTER — Other Ambulatory Visit: Payer: Self-pay

## 2022-08-14 ENCOUNTER — Other Ambulatory Visit (HOSPITAL_COMMUNITY): Payer: Self-pay

## 2022-08-15 ENCOUNTER — Other Ambulatory Visit: Payer: Self-pay

## 2022-08-29 ENCOUNTER — Telehealth: Payer: 59 | Admitting: Physician Assistant

## 2022-08-29 DIAGNOSIS — J208 Acute bronchitis due to other specified organisms: Secondary | ICD-10-CM | POA: Diagnosis not present

## 2022-08-29 DIAGNOSIS — B9689 Other specified bacterial agents as the cause of diseases classified elsewhere: Secondary | ICD-10-CM

## 2022-08-29 MED ORDER — PREDNISONE 10 MG (21) PO TBPK: ORAL_TABLET | ORAL | 0 refills | Status: DC

## 2022-08-29 MED ORDER — AZITHROMYCIN 250 MG PO TABS: ORAL_TABLET | ORAL | 0 refills | Status: AC

## 2022-08-29 NOTE — Progress Notes (Signed)
E-Visit for Cough  We are sorry that you are not feeling well.  Here is how we plan to help!  Based on your presentation I believe you most likely have A cough due to bacteria.  When patients have a fever and a productive cough with a change in color or increased sputum production, we are concerned about bacterial bronchitis.  If left untreated it can progress to pneumonia.  If your symptoms do not improve with your treatment plan it is important that you contact your provider.   I have prescribed Azithromyin 250 mg: two tablets now and then one tablet daily for 4 additonal days    In addition you may use A non-prescription cough medication called Mucinex DM: take 2 tablets every 12 hours.  Prednisone 10 mg daily for 6 days (see taper instructions below)  Directions for 6 day taper: Day 1: 2 tablets before breakfast, 1 after both lunch & dinner and 2 at bedtime Day 2: 1 tab before breakfast, 1 after both lunch & dinner and 2 at bedtime Day 3: 1 tab at each meal & 1 at bedtime Day 4: 1 tab at breakfast, 1 at lunch, 1 at bedtime Day 5: 1 tab at breakfast & 1 tab at bedtime Day 6: 1 tab at breakfast  From your responses in the eVisit questionnaire you describe inflammation in the upper respiratory tract which is causing a significant cough.  This is commonly called Bronchitis and has four common causes:   Allergies Viral Infections Acid Reflux Bacterial Infection Allergies, viruses and acid reflux are treated by controlling symptoms or eliminating the cause. An example might be a cough caused by taking certain blood pressure medications. You stop the cough by changing the medication. Another example might be a cough caused by acid reflux. Controlling the reflux helps control the cough.  USE OF BRONCHODILATOR ("RESCUE") INHALERS: There is a risk from using your bronchodilator too frequently.  The risk is that over-reliance on a medication which only relaxes the muscles surrounding the breathing  tubes can reduce the effectiveness of medications prescribed to reduce swelling and congestion of the tubes themselves.  Although you feel brief relief from the bronchodilator inhaler, your asthma may actually be worsening with the tubes becoming more swollen and filled with mucus.  This can delay other crucial treatments, such as oral steroid medications. If you need to use a bronchodilator inhaler daily, several times per day, you should discuss this with your provider.  There are probably better treatments that could be used to keep your asthma under control.     HOME CARE Only take medications as instructed by your medical team. Complete the entire course of an antibiotic. Drink plenty of fluids and get plenty of rest. Avoid close contacts especially the very young and the elderly Cover your mouth if you cough or cough into your sleeve. Always remember to wash your hands A steam or ultrasonic humidifier can help congestion.   GET HELP RIGHT AWAY IF: You develop worsening fever. You become short of breath You cough up blood. Your symptoms persist after you have completed your treatment plan MAKE SURE YOU  Understand these instructions. Will watch your condition. Will get help right away if you are not doing well or get worse.    Thank you for choosing an e-visit.  Your e-visit answers were reviewed by a board certified advanced clinical practitioner to complete your personal care plan. Depending upon the condition, your plan could have included both over  the counter or prescription medications.  Please review your pharmacy choice. Make sure the pharmacy is open so you can pick up prescription now. If there is a problem, you may contact your provider through [employer] and have the prescription routed to another pharmacy.  Your safety is important to [place]. If you have drug allergies check your prescription carefully.   For the next 24 hours you can use MyChart to ask questions  about today's visit, request a non-urgent call back, or ask for a work or school excuse. You will get an email in the next two days asking about your experience. I hope that your e-visit has been valuable and will speed your recovery.  I have spent 5 minutes in review of e-visit questionnaire, review and updating patient chart, medical decision making and response to patient.   Margaretann Loveless, PA-C

## 2022-08-30 ENCOUNTER — Other Ambulatory Visit: Payer: Self-pay

## 2022-08-30 ENCOUNTER — Other Ambulatory Visit (HOSPITAL_COMMUNITY): Payer: Self-pay

## 2022-09-05 ENCOUNTER — Other Ambulatory Visit (HOSPITAL_COMMUNITY): Payer: Self-pay

## 2022-09-06 ENCOUNTER — Ambulatory Visit
Admission: RE | Admit: 2022-09-06 | Discharge: 2022-09-06 | Disposition: A | Discharge status: Home / Self Care | Payer: 59 | Source: Ambulatory Visit | Attending: Advanced Practice Midwife | Admitting: Advanced Practice Midwife

## 2022-09-06 DIAGNOSIS — Z1239 Encounter for other screening for malignant neoplasm of breast: Secondary | ICD-10-CM

## 2022-09-06 DIAGNOSIS — Z1231 Encounter for screening mammogram for malignant neoplasm of breast: Secondary | ICD-10-CM | POA: Insufficient documentation

## 2022-09-10 ENCOUNTER — Other Ambulatory Visit: Payer: Self-pay | Admitting: Family

## 2022-09-10 DIAGNOSIS — R400 Somnolence: Secondary | ICD-10-CM

## 2022-09-11 ENCOUNTER — Other Ambulatory Visit: Payer: Self-pay

## 2022-09-12 ENCOUNTER — Other Ambulatory Visit: Payer: Self-pay

## 2022-09-12 ENCOUNTER — Other Ambulatory Visit: Payer: Self-pay | Admitting: Family

## 2022-09-12 ENCOUNTER — Other Ambulatory Visit (HOSPITAL_COMMUNITY): Payer: Self-pay

## 2022-09-12 ENCOUNTER — Encounter: Payer: Self-pay | Admitting: Family

## 2022-09-12 MED ORDER — MODAFINIL 100 MG PO TABS
100.0000 mg | ORAL_TABLET | Freq: Two times a day (BID) | ORAL | 0 refills | Status: DC
Start: 2022-09-12 — End: 2022-09-27
  Filled 2022-09-12: qty 60, 30d supply, fill #0

## 2022-09-13 ENCOUNTER — Other Ambulatory Visit (HOSPITAL_COMMUNITY): Payer: Self-pay

## 2022-09-13 ENCOUNTER — Other Ambulatory Visit: Payer: Self-pay

## 2022-09-26 ENCOUNTER — Other Ambulatory Visit (HOSPITAL_COMMUNITY): Payer: Self-pay

## 2022-09-26 MED ORDER — SODIUM FLUORIDE 5000 PPM 1.1 % DT PSTE
1.0000 | PASTE | Freq: Every day | DENTAL | 12 refills | Status: AC
  Filled 2022-09-26: qty 100, 15d supply, fill #0

## 2022-09-27 ENCOUNTER — Telehealth (INDEPENDENT_AMBULATORY_CARE_PROVIDER_SITE_OTHER): Payer: 59 | Admitting: Family

## 2022-09-27 ENCOUNTER — Other Ambulatory Visit (HOSPITAL_COMMUNITY): Payer: Self-pay

## 2022-09-27 ENCOUNTER — Other Ambulatory Visit: Payer: Self-pay

## 2022-09-27 VITALS — Wt 149.0 lb

## 2022-09-27 DIAGNOSIS — R400 Somnolence: Secondary | ICD-10-CM | POA: Diagnosis not present

## 2022-09-27 DIAGNOSIS — M7918 Myalgia, other site: Secondary | ICD-10-CM

## 2022-09-27 DIAGNOSIS — G894 Chronic pain syndrome: Secondary | ICD-10-CM

## 2022-09-27 DIAGNOSIS — M797 Fibromyalgia: Secondary | ICD-10-CM

## 2022-09-27 DIAGNOSIS — Z79899 Other long term (current) drug therapy: Secondary | ICD-10-CM | POA: Diagnosis not present

## 2022-09-27 MED ORDER — MODAFINIL 100 MG PO TABS
100.0000 mg | ORAL_TABLET | Freq: Two times a day (BID) | ORAL | 2 refills | Status: DC
Start: 2022-10-12 — End: 2023-01-27
  Filled 2022-09-27 – 2022-10-26 (×2): qty 60, 30d supply, fill #0
  Filled 2022-11-24: qty 60, 30d supply, fill #1
  Filled 2022-12-27: qty 60, 30d supply, fill #2

## 2022-09-27 MED ORDER — TRAMADOL HCL 50 MG PO TABS
50.0000 mg | ORAL_TABLET | Freq: Four times a day (QID) | ORAL | 3 refills | Status: DC | PRN
  Filled 2022-09-27 – 2022-10-26 (×2): qty 120, 30d supply, fill #0
  Filled 2022-11-24: qty 120, 30d supply, fill #1
  Filled 2022-12-27: qty 120, 30d supply, fill #2
  Filled 2023-01-27: qty 120, 30d supply, fill #3

## 2022-09-27 NOTE — Assessment & Plan Note (Addendum)
chronic taking Tramadol, Robaxin, Mucinex (fibromyalgia) refilling Tramadol today f/u in 3-4 mos  in office

## 2022-09-27 NOTE — Progress Notes (Signed)
MyChart Video Visit    Virtual Visit via Video Note   This format is felt to be most appropriate for this patient at this time. Physical exam was limited by quality of the video and audio technology used for the visit. CMA was able to get the patient set up on a video visit.  Patient location: Home. Patient and provider in visit Provider location: Office  I discussed the limitations of evaluation and management by telemedicine and the availability of in person appointments. The patient expressed understanding and agreed to proceed.  Visit Date: 09/27/2022  Today's healthcare provider: Dulce Sellar, NP     Subjective:   Patient ID: Karen Schwartz, female    DOB: 12-01-1970, 52 y.o.   MRN: [id number]  Chief Complaint  Patient presents with   Medical Management of Chronic Issues    HPI Fatigue:  persistent problem for over a year. Sleeps well most nights with Melatonin & Elavil. Reports she has to take a nap every day in the middle of the day or she can't stay awake with caffeine alone. Medications contributing are Metoprolol, & Tramadol.  *Modafanil started last visit, 100mg  qd. Reports some mild tachycardia and dry mouth, otherwise working very well to keep her awake, functioning to do her job and she has interest in doing other activities including exercise.  Pain: She reports chronic back pain. was not an injury that may have caused the pain. The pain started years ago and is staying constant. The pain does not radiate. The pain is described as aching, burning, tingling, soreness, stiffness, and throbbing, occurring every day. Symptoms are worse in the: all day & night.  Aggravating factors: sitting, standing, walking, and lifting, pulling. Relieving factors: medication Tramadol, Naproxen, Elavil, muscle relaxers and rest. She has tried application of heat, application of ice, acetaminophen, NSAIDs, topical anesthetics, physical therapy, and message therapy with little  relief, reports she does get relief with therapeutic massage, but does not get more often d/t expense.    Assessment & Plan:  High risk medication use - Tramadol, Modafanil, Elavil, Robaxin - continue to educate pt on safety of med use, monitor for SE  Chronic pain syndrome -     traMADol HCl; Take 1 tablet by mouth every 6 hours as needed for severe pain. **Each refill must last 30 days.  Dispense: 120 tablet; Refill: 3  Fibromyalgia -     traMADol HCl; Take 1 tablet by mouth every 6 hours as needed for severe pain. **Each refill must last 30 days.  Dispense: 120 tablet; Refill: 3  Myofascial pain syndrome of thoracic spine Assessment & Plan: chronic taking Tramadol, Robaxin, Mucinex (fibromyalgia) refilling Tramadol today f/u in 3-4 mos  in office  Orders: -     traMADol HCl; Take 1 tablet by mouth every 6 hours as needed for severe pain. **Each refill must last 30 days.  Dispense: 120 tablet; Refill: 3  Daytime somnolence Assessment & Plan: New, r/t fatigue, fibromyalgia denies regular issue with sleep, low risk for sleep apnea sent Modafinil 100mg  qd last visit, pt reported via MyChart she was doing much better, but wears off after lunch, sent 100mg  bid, tolerating, reports mild tachycardia but on Metoprolol and HR had been in 70s now up to 90s again on some days, >100 with activity advised pt to monitor HR & let me know if any higher resting readings continue to advise on drinking plenty of water,  minimize caffeine intake, increase exercise as able f/u  3 month in office  Orders: -     Modafinil; Take 1 tablet (100 mg total) by mouth in the morning and at lunch time. (**max 200mg  daily**)  Dispense: 60 tablet; Refill: 2   Past Medical History:  Diagnosis Date   Atopic dermatitis 12/06/2020   Bloating 02/11/2018   Chronic pain syndrome 12/06/2020   Chronic pain syndrome 12/06/2020   Chronic thoracic back pain (1ry area of Pain) (Bilateral) (L>R) 12/06/2020   Chronic upper  back pain (Bilateral) 02/07/2021   Contact dermatitis and other eczema due to other chemical products 12/06/2020   Disorder of skeletal system 12/06/2020   Fibromyalgia    Hypertension    boderline   IBS (irritable bowel syndrome)    Other chronic allergic conjunctivitis 12/06/2020   -patient with periocular pruritis and epiphora but with very few nasal symptoms -will give trial of zaditor eye dropds prn  -given h/o severe atopic derm and periocular skin involvement cannot r/o atopic keratoconjunctivitis and thus will refer to derm   Pharmacologic therapy 12/06/2020   Problems influencing health status 12/06/2020   Thoracic facet syndrome (Bilateral) 02/07/2021    No past surgical history on file.  Outpatient Medications Prior to Visit  Medication Sig Dispense Refill   amitriptyline (ELAVIL) 75 MG tablet Take 1 tablet (75 mg total) by mouth at bedtime. 90 tablet 1   Cholecalciferol (VITAMIN D) 125 MCG (5000 UT) CAPS Take by mouth.     guaiFENesin (MUCINEX) 600 MG 12 hr tablet Take 600 mg by mouth 2 (two) times daily. PRN - pt takes for fibromyalgia sx, off label     melatonin 5 MG TABS Take 5 mg by mouth at bedtime.     methocarbamol (ROBAXIN) 750 MG tablet Take 1 tablet by mouth every 8 hours as needed for muscle spasms. 180 tablet 1   metoprolol succinate (TOPROL-XL) 25 MG 24 hr tablet Take 1/2 tablet by mouth every evening and 1 tablet every morning. 135 tablet 1   Multiple Vitamin (MULTIVITAMIN) tablet Take 1 tablet by mouth daily.     NAPROXEN PO Take by mouth as needed.     polyethylene glycol (MIRALAX / GLYCOLAX) packet Take 17 g by mouth daily.     Sodium Fluoride (SODIUM FLUORIDE 5000 PPM) 1.1 % PSTE Apply to teeth once daily in the evening 100 mL 12   SUMAtriptan (IMITREX) 100 MG tablet TAKE 1 TABLET BY MOUTH EVERY 2 HOURS AS NEEDED AS DIRECTED 9 tablet 3   modafinil (PROVIGIL) 100 MG tablet Take 1 tablet (100 mg total) by mouth in the morning and at lunch time. (**max 200mg  daily**)  60 tablet 0   traMADol (ULTRAM) 50 MG tablet Take 1 tablet by mouth every 6 hours as needed for severe pain. **Each refill must last 30 days. 120 tablet 3   predniSONE (STERAPRED UNI-PAK 21 TAB) 10 MG (21) TBPK tablet 6 day taper; take as directed on package instructions (Patient not taking: Reported on 09/27/2022) 21 tablet 0   No facility-administered medications prior to visit.    Allergies  Allergen Reactions   Ampicillin    Cymbalta [Duloxetine Hcl]    Latex Itching   Lyrica [Pregabalin]    Zantac [Ranitidine Hcl]     Tachycardia       Objective:   Physical Exam Vitals and nursing note reviewed.  Constitutional:      General: Pt is not in acute distress.    Appearance: Normal appearance.  HENT:     Head:  Normocephalic.  Pulmonary:     Effort: No respiratory distress.  Musculoskeletal:     Cervical back: Normal range of motion.  Skin:    General: Skin is dry.     Coloration: Skin is not pale.  Neurological:     Mental Status: Pt is alert and oriented to person, place, and time.  Psychiatric:        Mood and Affect: Mood normal.   Wt 149 lb (67.6 kg)   BMI 27.25 kg/m   Wt Readings from Last 3 Encounters:  09/27/22 149 lb (67.6 kg)  06/19/22 148 lb 8 oz (67.4 kg)  03/15/22 145 lb (65.8 kg)      I discussed the assessment and treatment plan with the patient. The patient was provided an opportunity to ask questions and all were answered. The patient agreed with the plan and demonstrated an understanding of the instructions.   The patient was advised to call back or seek an in-person evaluation if the symptoms worsen or if the condition fails to improve as anticipated.  Dulce Sellar, NP [facility] [place] [phone] (phone) [phone] (fax)  [facility]

## 2022-09-27 NOTE — Assessment & Plan Note (Addendum)
New, r/t fatigue, fibromyalgia denies regular issue with sleep, low risk for sleep apnea sent Modafinil 100mg  qd last visit, pt reported via MyChart she was doing much better, but wears off after lunch, sent 100mg  bid, tolerating, reports mild tachycardia but on Metoprolol and HR had been in 70s now up to 90s again on some days, >100 with activity advised pt to monitor HR & let me know if any higher resting readings continue to advise on drinking plenty of water,  minimize caffeine intake, increase exercise as able f/u 3 month  in office

## 2022-10-11 ENCOUNTER — Other Ambulatory Visit: Payer: Self-pay | Admitting: Family

## 2022-10-11 ENCOUNTER — Other Ambulatory Visit (HOSPITAL_COMMUNITY): Payer: Self-pay

## 2022-10-11 DIAGNOSIS — R000 Tachycardia, unspecified: Secondary | ICD-10-CM

## 2022-10-11 MED ORDER — METOPROLOL SUCCINATE ER 25 MG PO TB24
ORAL_TABLET | ORAL | 1 refills | Status: DC
Start: 2022-10-11 — End: 2023-02-02
  Filled 2022-10-11: qty 135, 90d supply, fill #0
  Filled 2023-01-14: qty 135, 90d supply, fill #1

## 2022-10-26 ENCOUNTER — Other Ambulatory Visit: Payer: Self-pay

## 2022-10-26 ENCOUNTER — Other Ambulatory Visit (HOSPITAL_COMMUNITY): Payer: Self-pay

## 2022-11-25 ENCOUNTER — Other Ambulatory Visit (HOSPITAL_COMMUNITY): Payer: Self-pay

## 2022-11-27 ENCOUNTER — Other Ambulatory Visit: Payer: Self-pay

## 2022-11-28 ENCOUNTER — Other Ambulatory Visit (HOSPITAL_COMMUNITY): Payer: Self-pay

## 2022-11-28 ENCOUNTER — Other Ambulatory Visit: Payer: Self-pay

## 2022-11-29 ENCOUNTER — Other Ambulatory Visit: Payer: Self-pay | Admitting: Oncology

## 2022-11-29 DIAGNOSIS — Z006 Encounter for examination for normal comparison and control in clinical research program: Secondary | ICD-10-CM

## 2022-12-28 ENCOUNTER — Other Ambulatory Visit: Payer: Self-pay

## 2023-01-14 ENCOUNTER — Other Ambulatory Visit (HOSPITAL_COMMUNITY): Payer: Self-pay

## 2023-01-15 ENCOUNTER — Other Ambulatory Visit: Payer: Self-pay

## 2023-01-27 ENCOUNTER — Other Ambulatory Visit: Payer: Self-pay | Admitting: Family

## 2023-01-27 DIAGNOSIS — R400 Somnolence: Secondary | ICD-10-CM

## 2023-01-28 ENCOUNTER — Other Ambulatory Visit (HOSPITAL_COMMUNITY): Payer: Self-pay

## 2023-01-29 ENCOUNTER — Other Ambulatory Visit: Payer: Self-pay

## 2023-01-31 MED ORDER — MODAFINIL 100 MG PO TABS
100.0000 mg | ORAL_TABLET | Freq: Two times a day (BID) | ORAL | 0 refills | Status: DC
Start: 2023-01-31 — End: 2023-02-02
  Filled 2023-01-31: qty 60, 30d supply, fill #0

## 2023-02-01 ENCOUNTER — Other Ambulatory Visit: Payer: Self-pay

## 2023-02-02 ENCOUNTER — Other Ambulatory Visit (HOSPITAL_COMMUNITY): Payer: Self-pay

## 2023-02-02 ENCOUNTER — Other Ambulatory Visit: Payer: Self-pay

## 2023-02-02 ENCOUNTER — Ambulatory Visit (INDEPENDENT_AMBULATORY_CARE_PROVIDER_SITE_OTHER): Payer: 59 | Admitting: Family

## 2023-02-02 VITALS — BP 118/87 | HR 82 | Temp 98.0°F | Ht 62.0 in | Wt 149.2 lb

## 2023-02-02 DIAGNOSIS — R400 Somnolence: Secondary | ICD-10-CM

## 2023-02-02 DIAGNOSIS — M797 Fibromyalgia: Secondary | ICD-10-CM

## 2023-02-02 DIAGNOSIS — M7918 Myalgia, other site: Secondary | ICD-10-CM

## 2023-02-02 DIAGNOSIS — R000 Tachycardia, unspecified: Secondary | ICD-10-CM | POA: Diagnosis not present

## 2023-02-02 MED ORDER — TRAMADOL HCL 50 MG PO TABS
50.0000 mg | ORAL_TABLET | Freq: Four times a day (QID) | ORAL | 3 refills | Status: DC | PRN
  Filled 2023-02-02 – 2023-02-26 (×3): qty 120, 30d supply, fill #0
  Filled 2023-03-02 – 2023-04-08 (×2): qty 120, 30d supply, fill #1
  Filled 2023-05-07: qty 120, 30d supply, fill #2
  Filled 2023-06-04: qty 120, 30d supply, fill #3

## 2023-02-02 MED ORDER — MODAFINIL 100 MG PO TABS
100.0000 mg | ORAL_TABLET | Freq: Two times a day (BID) | ORAL | 0 refills | Status: DC
Start: 2023-02-02 — End: 2023-05-29
  Filled 2023-02-02 – 2023-03-01 (×4): qty 180, 90d supply, fill #0

## 2023-02-02 MED ORDER — METOPROLOL SUCCINATE ER 25 MG PO TB24
ORAL_TABLET | ORAL | 1 refills | Status: DC
Start: 2023-02-02 — End: 2023-06-22
  Filled 2023-02-02 – 2023-04-15 (×2): qty 135, 90d supply, fill #0

## 2023-02-02 MED ORDER — AMITRIPTYLINE HCL 75 MG PO TABS
75.0000 mg | ORAL_TABLET | Freq: Every day | ORAL | 1 refills | Status: DC
Start: 2023-02-02 — End: 2023-06-22
  Filled 2023-02-02 – 2023-02-25 (×2): qty 90, 90d supply, fill #0
  Filled 2023-05-29 – 2023-06-01 (×2): qty 90, 90d supply, fill #1

## 2023-02-02 MED ORDER — METHOCARBAMOL 750 MG PO TABS
750.0000 mg | ORAL_TABLET | Freq: Three times a day (TID) | ORAL | 1 refills | Status: DC | PRN
Start: 2023-02-02 — End: 2023-06-22
  Filled 2023-02-02: qty 180, 60d supply, fill #0
  Filled 2023-02-25 – 2023-05-07 (×3): qty 180, 60d supply, fill #1

## 2023-02-02 NOTE — Assessment & Plan Note (Signed)
chronic, stable taking Tramadol, Robaxin, Elavil sending refills today f/u in 4 mos.

## 2023-02-02 NOTE — Assessment & Plan Note (Signed)
Chronic, stable, r/t fatigue, fibromyalgia sent Modafinil 100mg  bid continue to advise on drinking plenty of water,  minimize caffeine intake, increase exercise as able f/u 4 month virtual ok

## 2023-02-02 NOTE — Assessment & Plan Note (Addendum)
chronic stable on Metoprolol 25mg  qam, 12.5mg  at bedtime advised to flip doses for less fatigue during day sending refill f/u in 6 mos

## 2023-02-02 NOTE — Assessment & Plan Note (Addendum)
chronic, stable taking Tramadol, Robaxin sending refills f/u in 3-4 mos  in office

## 2023-02-02 NOTE — Progress Notes (Signed)
Patient ID: Karen Schwartz, female    DOB: 08-Apr-1971, 51 y.o.   MRN: [id number]  Chief Complaint  Patient presents with   Follow-up    HPI: Fatigue:  persistent problem for over a year. Sleeps well most nights with Melatonin & Elavil. Reports she has to take a nap every day in the middle of the day or she can't stay awake with caffeine alone. Medications contributing are Metoprolol, & Tramadol.  *Modafanil working well, 100mg  bid. Reports some mild tachycardia and dry mouth, otherwise working very well to keep her awake, functioning to do her job and she has interest in doing other activities including exercise.  Pain: She reports chronic back pain. was not an injury that may have caused the pain. The pain started years ago and is staying constant. The pain does not radiate. The pain is described as aching, burning, tingling, soreness, stiffness, and throbbing, occurring every day. Symptoms are worse in the: all day & night.  Aggravating factors: sitting, standing, walking, and lifting, pulling. Relieving factors: medication Tramadol, Naproxen, Elavil, muscle relaxers and rest. She has tried application of heat, application of ice, acetaminophen, NSAIDs, topical anesthetics, physical therapy, and massage therapy with little relief, reports she does get relief with therapeutic massage, but does not get more often d/t expense.   Tachycardia:  with mild hypertension, denies palpitations, controlled with Metoprolol 25mg  qam and 12.5mg  qhs.   Assessment & Plan:  Daytime somnolence Assessment & Plan: Chronic, stable, r/t fatigue, fibromyalgia sent Modafinil 100mg  bid continue to advise on drinking plenty of water,  minimize caffeine intake, increase exercise as able f/u 4 month virtual ok  Orders: -     Modafinil; Take 1 tablet (100 mg total) by mouth in the morning and at lunch time. (**max 200mg  daily**)  Dispense: 180 tablet; Refill: 0  Tachycardia Assessment & Plan: chronic stable on  Metoprolol 25mg  qam, 12.5mg  at bedtime advised to flip doses for less fatigue during day sending refill f/u in 6 mos  Orders: -     Metoprolol Succinate ER; Take 1 tablet (25 mg total) by mouth every morning AND 0.5 tablets (12.5 mg total) every evening.  Dispense: 135 tablet; Refill: 1  Fibromyalgia Assessment & Plan: chronic, stable taking Tramadol, Robaxin, Elavil sending refills today f/u in 4 mos.   Orders: -     Amitriptyline HCl; Take 1 tablet (75 mg total) by mouth at bedtime.  Dispense: 90 tablet; Refill: 1 -     Methocarbamol; Take 1 tablet (750 mg total) by mouth every 8 (eight) hours as needed for muscle spasms.  Dispense: 180 tablet; Refill: 1 -     traMADol HCl; Take 1 tablet by mouth every 6 hours as needed for severe pain. **Each refill must last 30 days.  Dispense: 120 tablet; Refill: 3  Myofascial pain syndrome of thoracic spine Assessment & Plan: chronic, stable taking Tramadol, Robaxin sending refills f/u in 3-4 mos  in office  Orders: -     Methocarbamol; Take 1 tablet (750 mg total) by mouth every 8 (eight) hours as needed for muscle spasms.  Dispense: 180 tablet; Refill: 1 -     traMADol HCl; Take 1 tablet by mouth every 6 hours as needed for severe pain. **Each refill must last 30 days.  Dispense: 120 tablet; Refill: 3   Subjective:    Outpatient Medications Prior to Visit  Medication Sig Dispense Refill   Cholecalciferol (VITAMIN D) 125 MCG (5000 UT) CAPS Take by mouth.  melatonin 5 MG TABS Take 5 mg by mouth at bedtime.     Multiple Vitamin (MULTIVITAMIN) tablet Take 1 tablet by mouth daily.     NAPROXEN PO Take by mouth as needed.     polyethylene glycol (MIRALAX / GLYCOLAX) packet Take 17 g by mouth daily.     Sodium Fluoride (SODIUM FLUORIDE 5000 PPM) 1.1 % PSTE Apply to teeth once daily in the evening 100 mL 12   SUMAtriptan (IMITREX) 100 MG tablet TAKE 1 TABLET BY MOUTH EVERY 2 HOURS AS NEEDED AS DIRECTED 9 tablet 3   amitriptyline (ELAVIL)  75 MG tablet Take 1 tablet (75 mg total) by mouth at bedtime. 90 tablet 1   methocarbamol (ROBAXIN) 750 MG tablet Take 1 tablet by mouth every 8 hours as needed for muscle spasms. 180 tablet 1   metoprolol succinate (TOPROL-XL) 25 MG 24 hr tablet Take 1 tablet (25 mg total) by mouth every morning AND 0.5 tablets (12.5 mg total) every evening. 135 tablet 1   modafinil (PROVIGIL) 100 MG tablet Take 1 tablet (100 mg total) by mouth in the morning and at lunch time. (**max 200mg  daily**) 60 tablet 0   traMADol (ULTRAM) 50 MG tablet Take 1 tablet by mouth every 6 hours as needed for severe pain. **Each refill must last 30 days. 120 tablet 3   guaiFENesin (MUCINEX) 600 MG 12 hr tablet Take 600 mg by mouth 2 (two) times daily. PRN - pt takes for fibromyalgia sx, off label (Patient not taking: Reported on 02/02/2023)     No facility-administered medications prior to visit.   Past Medical History:  Diagnosis Date   Atopic dermatitis 12/06/2020   Bloating 02/11/2018   Chronic pain syndrome 12/06/2020   Chronic pain syndrome 12/06/2020   Chronic thoracic back pain (1ry area of Pain) (Bilateral) (L>R) 12/06/2020   Chronic upper back pain (Bilateral) 02/07/2021   Contact dermatitis and other eczema due to other chemical products 12/06/2020   Disorder of skeletal system 12/06/2020   Fibromyalgia    Hypertension    boderline   IBS (irritable bowel syndrome)    Other chronic allergic conjunctivitis 12/06/2020   -patient with periocular pruritis and epiphora but with very few nasal symptoms -will give trial of zaditor eye dropds prn  -given h/o severe atopic derm and periocular skin involvement cannot r/o atopic keratoconjunctivitis and thus will refer to derm   Pharmacologic therapy 12/06/2020   Problems influencing health status 12/06/2020   Thoracic facet syndrome (Bilateral) 02/07/2021   No past surgical history on file. Allergies  Allergen Reactions   Ampicillin    Cymbalta [Duloxetine Hcl]    Latex Itching    Lyrica [Pregabalin]    Zantac [Ranitidine Hcl]     Tachycardia      Objective:    Physical Exam Vitals and nursing note reviewed.  Constitutional:      Appearance: Normal appearance.  Cardiovascular:     Rate and Rhythm: Normal rate and regular rhythm.  Pulmonary:     Effort: Pulmonary effort is normal.     Breath sounds: Normal breath sounds.  Musculoskeletal:        General: Normal range of motion.  Skin:    General: Skin is warm and dry.  Neurological:     Mental Status: She is alert.  Psychiatric:        Mood and Affect: Mood normal.        Behavior: Behavior normal.    BP 118/87   Pulse 82  Temp 98 F (36.7 C) (Temporal)   Ht 5\' 2"  (1.575 m)   Wt 149 lb 3.2 oz (67.7 kg)   LMP 01/17/2023 (Approximate)   SpO2 100%   BMI 27.29 kg/m  Wt Readings from Last 3 Encounters:  02/02/23 149 lb 3.2 oz (67.7 kg)  09/27/22 149 lb (67.6 kg)  06/19/22 148 lb 8 oz (67.4 kg)       Dulce Sellar, NP

## 2023-02-03 ENCOUNTER — Other Ambulatory Visit (HOSPITAL_COMMUNITY): Payer: Self-pay

## 2023-02-05 ENCOUNTER — Other Ambulatory Visit (HOSPITAL_COMMUNITY): Payer: Self-pay

## 2023-02-25 ENCOUNTER — Other Ambulatory Visit (HOSPITAL_COMMUNITY): Payer: Self-pay

## 2023-02-26 ENCOUNTER — Other Ambulatory Visit (HOSPITAL_COMMUNITY): Payer: Self-pay

## 2023-02-26 ENCOUNTER — Other Ambulatory Visit: Payer: Self-pay

## 2023-03-01 ENCOUNTER — Other Ambulatory Visit: Payer: Self-pay

## 2023-03-01 ENCOUNTER — Other Ambulatory Visit (HOSPITAL_COMMUNITY): Payer: Self-pay

## 2023-03-02 ENCOUNTER — Other Ambulatory Visit (HOSPITAL_COMMUNITY): Payer: Self-pay

## 2023-04-09 ENCOUNTER — Other Ambulatory Visit: Payer: Self-pay

## 2023-04-16 ENCOUNTER — Other Ambulatory Visit: Payer: Self-pay

## 2023-05-07 ENCOUNTER — Other Ambulatory Visit (HOSPITAL_COMMUNITY): Payer: Self-pay

## 2023-05-07 ENCOUNTER — Other Ambulatory Visit: Payer: Self-pay

## 2023-05-29 ENCOUNTER — Other Ambulatory Visit (HOSPITAL_COMMUNITY): Payer: Self-pay

## 2023-05-29 ENCOUNTER — Other Ambulatory Visit: Payer: Self-pay | Admitting: Family

## 2023-05-29 DIAGNOSIS — R400 Somnolence: Secondary | ICD-10-CM

## 2023-05-30 ENCOUNTER — Encounter (HOSPITAL_BASED_OUTPATIENT_CLINIC_OR_DEPARTMENT_OTHER): Payer: Self-pay

## 2023-05-30 ENCOUNTER — Other Ambulatory Visit (HOSPITAL_COMMUNITY): Payer: Self-pay

## 2023-05-30 ENCOUNTER — Other Ambulatory Visit (HOSPITAL_BASED_OUTPATIENT_CLINIC_OR_DEPARTMENT_OTHER): Payer: Self-pay

## 2023-05-30 MED ORDER — MODAFINIL 100 MG PO TABS
100.0000 mg | ORAL_TABLET | Freq: Two times a day (BID) | ORAL | 0 refills | Status: DC
Start: 2023-05-30 — End: 2023-06-22
  Filled 2023-05-30: qty 180, 90d supply, fill #0

## 2023-05-31 ENCOUNTER — Other Ambulatory Visit (HOSPITAL_COMMUNITY): Payer: Self-pay

## 2023-05-31 ENCOUNTER — Other Ambulatory Visit: Payer: Self-pay

## 2023-06-01 ENCOUNTER — Other Ambulatory Visit
Admission: RE | Admit: 2023-06-01 | Discharge: 2023-06-01 | Disposition: A | Discharge status: Home / Self Care | Payer: Self-pay | Source: Ambulatory Visit | Attending: Medical Genetics | Admitting: Medical Genetics

## 2023-06-01 ENCOUNTER — Other Ambulatory Visit (HOSPITAL_COMMUNITY): Payer: Self-pay

## 2023-06-01 ENCOUNTER — Other Ambulatory Visit: Payer: Self-pay

## 2023-06-01 ENCOUNTER — Encounter (HOSPITAL_COMMUNITY): Payer: Self-pay

## 2023-06-01 DIAGNOSIS — Z006 Encounter for examination for normal comparison and control in clinical research program: Secondary | ICD-10-CM | POA: Insufficient documentation

## 2023-06-04 ENCOUNTER — Other Ambulatory Visit (HOSPITAL_COMMUNITY): Payer: Self-pay

## 2023-06-06 ENCOUNTER — Other Ambulatory Visit (HOSPITAL_COMMUNITY): Payer: Self-pay

## 2023-06-12 ENCOUNTER — Encounter (INDEPENDENT_AMBULATORY_CARE_PROVIDER_SITE_OTHER): Payer: Self-pay

## 2023-06-12 LAB — GENECONNECT MOLECULAR SCREEN: Genetic Analysis Overall Interpretation: NEGATIVE

## 2023-06-22 ENCOUNTER — Encounter: Payer: Self-pay | Admitting: Family

## 2023-06-22 ENCOUNTER — Ambulatory Visit: Payer: 59 | Admitting: Family

## 2023-06-22 VITALS — BP 99/73 | HR 84 | Temp 97.9°F | Ht 62.0 in | Wt 145.6 lb

## 2023-06-22 DIAGNOSIS — R400 Somnolence: Secondary | ICD-10-CM | POA: Diagnosis not present

## 2023-06-22 DIAGNOSIS — Z0000 Encounter for general adult medical examination without abnormal findings: Secondary | ICD-10-CM

## 2023-06-22 DIAGNOSIS — M7918 Myalgia, other site: Secondary | ICD-10-CM

## 2023-06-22 DIAGNOSIS — M797 Fibromyalgia: Secondary | ICD-10-CM | POA: Diagnosis not present

## 2023-06-22 DIAGNOSIS — Z13220 Encounter for screening for lipoid disorders: Secondary | ICD-10-CM | POA: Diagnosis not present

## 2023-06-22 DIAGNOSIS — R000 Tachycardia, unspecified: Secondary | ICD-10-CM

## 2023-06-22 DIAGNOSIS — G43709 Chronic migraine without aura, not intractable, without status migrainosus: Secondary | ICD-10-CM | POA: Diagnosis not present

## 2023-06-22 LAB — CBC WITH DIFFERENTIAL/PLATELET
Basophils Absolute: 0 10*3/uL (ref 0.0–0.1)
Basophils Relative: 0.7 % (ref 0.0–3.0)
Eosinophils Absolute: 0.1 10*3/uL (ref 0.0–0.7)
Eosinophils Relative: 2.2 % (ref 0.0–5.0)
HCT: 43.8 % (ref 36.0–46.0)
Hemoglobin: 14.2 g/dL (ref 12.0–15.0)
Lymphocytes Relative: 48.7 % — ABNORMAL HIGH (ref 12.0–46.0)
Lymphs Abs: 3.3 10*3/uL (ref 0.7–4.0)
MCHC: 32.4 g/dL (ref 30.0–36.0)
MCV: 89.3 fL (ref 78.0–100.0)
Monocytes Absolute: 0.4 10*3/uL (ref 0.1–1.0)
Monocytes Relative: 5.6 % (ref 3.0–12.0)
Neutro Abs: 2.9 10*3/uL (ref 1.4–7.7)
Neutrophils Relative %: 42.8 % — ABNORMAL LOW (ref 43.0–77.0)
Platelets: 288 10*3/uL (ref 150.0–400.0)
RBC: 4.9 Mil/uL (ref 3.87–5.11)
RDW: 13.4 % (ref 11.5–15.5)
WBC: 6.7 10*3/uL (ref 4.0–10.5)

## 2023-06-22 LAB — COMPREHENSIVE METABOLIC PANEL
ALT: 21 U/L (ref 0–35)
AST: 22 U/L (ref 0–37)
Albumin: 4.3 g/dL (ref 3.5–5.2)
Alkaline Phosphatase: 63 U/L (ref 39–117)
BUN: 13 mg/dL (ref 6–23)
CO2: 30 meq/L (ref 19–32)
Calcium: 9 mg/dL (ref 8.4–10.5)
Chloride: 104 meq/L (ref 96–112)
Creatinine, Ser: 0.72 mg/dL (ref 0.40–1.20)
GFR: 96.94 mL/min (ref >60.00)
Glucose, Bld: 96 mg/dL (ref 70–99)
Potassium: 4.2 meq/L (ref 3.5–5.1)
Sodium: 140 meq/L (ref 135–145)
Total Bilirubin: 0.3 mg/dL (ref 0.2–1.2)
Total Protein: 6.7 g/dL (ref 6.0–8.3)

## 2023-06-22 LAB — LIPID PANEL
Cholesterol: 156 mg/dL (ref 0–200)
HDL: 63.5 mg/dL (ref >39.00)
LDL Cholesterol: 76 mg/dL (ref 0–99)
NonHDL: 92.24
Total CHOL/HDL Ratio: 2
Triglycerides: 82 mg/dL (ref 0.0–149.0)
VLDL: 16.4 mg/dL (ref 0.0–40.0)

## 2023-06-22 NOTE — Progress Notes (Unsigned)
Phone [phone]  Subjective:   Patient is a 53 y.o. female presenting for annual physical.    Chief Complaint  Patient presents with   Annual Exam    Fasting w/ labs   HPI: Fatigue:  persistent problem for over a year. Sleeps well most nights with Melatonin & Elavil. Reports she has to take a nap every day in the middle of the day or she can't stay awake with caffeine alone. Medications contributing are Metoprolol, & Tramadol.    Pain: She reports chronic back pain. was not an injury that may have caused the pain. The pain started years ago and is staying constant. The pain does not radiate. The pain is described as aching, burning, tingling, soreness, stiffness, and throbbing, occurring every day. Symptoms are worse in the: all day & night.  Aggravating factors: sitting, standing, walking, and lifting, pulling. Relieving factors: medication Tramadol, Naproxen, Elavil, muscle relaxers and rest. She has tried application of heat, application of ice, acetaminophen, NSAIDs, topical anesthetics, physical therapy, and message therapy with little relief, reports she does get relief with therapeutic massage, but does not get more often d/t expense.   See problem oriented charting- ROS- full  review of systems was completed and negative except for HPI above.   The following were reviewed and entered/updated in epic: Past Medical History:  Diagnosis Date   Atopic dermatitis 12/06/2020   Bloating 02/11/2018   Chronic pain syndrome 12/06/2020   Chronic pain syndrome 12/06/2020   Chronic thoracic back pain (1ry area of Pain) (Bilateral) (L>R) 12/06/2020   Chronic upper back pain (Bilateral) 02/07/2021   Contact dermatitis and other eczema due to other chemical products 12/06/2020   Disorder of skeletal system 12/06/2020   Fibromyalgia    Hypertension    boderline   IBS (irritable bowel syndrome)    Other chronic allergic conjunctivitis 12/06/2020   -patient with periocular pruritis and epiphora  but with very few nasal symptoms -will give trial of zaditor eye dropds prn  -given h/o severe atopic derm and periocular skin involvement cannot r/o atopic keratoconjunctivitis and thus will refer to derm   Pharmacologic therapy 12/06/2020   Problems influencing health status 12/06/2020   Thoracic facet syndrome (Bilateral) 02/07/2021   Patient Active Problem List   Diagnosis Date Noted   Daytime somnolence 06/19/2022   Chronic fatigue 08/26/2021   Thoracic facet hypertrophy 06/01/2021   Spondylosis without myelopathy or radiculopathy, 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  Family History  Problem Relation Age of Onset   Hypertension Mother    Depression Mother    Stroke Mother    Rectal cancer Father    Cancer Father        bladder cancer, rectal cancer   Atrial fibrillation Father    Hypertension Sister    Depression Sister    Mental illness Brother        suicide   Alcohol abuse Brother    Diabetes Maternal Grandmother    Macular degeneration Maternal Grandmother    Dementia Maternal Grandfather    Esophageal cancer Neg Hx    Inflammatory bowel disease Neg Hx    Liver disease Neg Hx    Pancreatic cancer Neg Hx    Stomach cancer Neg  Hx    Colon cancer Neg Hx     Medications- reviewed and updated Current Outpatient Medications  Medication Sig Dispense Refill   amitriptyline (ELAVIL) 75 MG tablet Take 1 tablet (75 mg total) by mouth at bedtime. 90 tablet 1   Cholecalciferol (VITAMIN D) 125 MCG (5000 UT) CAPS Take by mouth.     melatonin 5 MG TABS Take 5 mg by mouth at bedtime.     methocarbamol (ROBAXIN) 750 MG tablet Take 1 tablet (750 mg total) by mouth every 8 (eight) hours as  needed for muscle spasms. 180 tablet 1   metoprolol succinate (TOPROL-XL) 25 MG 24 hr tablet Take 1 tablet (25 mg total) by mouth every morning AND 0.5 tablets (12.5 mg total) every evening. 135 tablet 1   modafinil (PROVIGIL) 100 MG tablet Take 1 tablet (100 mg total) by mouth in the morning and at lunch time. Max 2 tablets daily 180 tablet 0   Multiple Vitamin (MULTIVITAMIN) tablet Take 1 tablet by mouth daily.     NAPROXEN PO Take by mouth as needed.     polyethylene glycol (MIRALAX / GLYCOLAX) packet Take 17 g by mouth daily.     Sodium Fluoride (SODIUM FLUORIDE 5000 PPM) 1.1 % PSTE Apply to teeth once daily in the evening 100 mL 12   SUMAtriptan (IMITREX) 100 MG tablet TAKE 1 TABLET BY MOUTH EVERY 2 HOURS AS NEEDED AS DIRECTED 9 tablet 3   traMADol (ULTRAM) 50 MG tablet Take 1 tablet by mouth every 6 hours as needed for severe pain. **Each refill must last 30 days. 120 tablet 3   No current facility-administered medications for this visit.    Allergies-reviewed and updated Allergies  Allergen Reactions   Ampicillin    Cymbalta [Duloxetine Hcl]    Latex Itching   Lyrica [Pregabalin]    Zantac [Ranitidine Hcl]     Tachycardia    Social History   Social History Narrative   Not on file    Objective:  BP 99/73 (BP Location: Left Arm, Patient Position: Sitting, Cuff Size: Normal)   Pulse 84   Temp 97.9 F (36.6 C) (Temporal)   Ht 5\' 2"  (1.575 m)   Wt 145 lb 9.6 oz (66 kg)   LMP 06/19/2023 (Exact Date)   SpO2 96%   BMI 26.63 kg/m  Physical Exam Vitals and nursing note reviewed.  Constitutional:      Appearance: Normal appearance.  HENT:     Head: Normocephalic.     Right Ear: Tympanic membrane normal.     Left Ear: Tympanic membrane normal.     Nose: Nose normal.     Mouth/Throat:     Mouth: Mucous membranes are moist.  Eyes:     Pupils: Pupils are equal, round, and reactive to light.  Cardiovascular:     Rate and Rhythm: Normal rate and regular rhythm.   Pulmonary:     Effort: Pulmonary effort is normal.     Breath sounds: Normal breath sounds.  Musculoskeletal:        General: Normal range of motion.     Cervical back: Normal range of motion.  Lymphadenopathy:     Cervical: No cervical adenopathy.  Skin:    General: Skin is warm and dry.  Neurological:     Mental Status: She is alert.  Psychiatric:        Mood and Affect: Mood normal.        Behavior: Behavior normal.      Assessment  and Plan   Health Maintenance counseling: 1. Anticipatory guidance: Patient counseled regarding regular dental exams q6 months, eye exams,  avoiding smoking and second hand smoke, limiting alcohol to 1 beverage per day, no illicit drugs.   2. Risk factor reduction:  Advised patient of need for regular exercise and diet rich with fruits and vegetables to reduce risk of heart attack and stroke. Exercise- none.  Wt Readings from Last 3 Encounters:  06/22/23 145 lb 9.6 oz (66 kg)  02/02/23 149 lb 3.2 oz (67.7 kg)  09/27/22 149 lb (67.6 kg)   3. Immunizations/screenings/ancillary studies Immunization History  Administered Date(s) Administered   Hepatitis A 12/02/2009   Hepatitis B 05/22/1992   Influenza Split 03/05/2013, 02/20/2015   Influenza Whole 04/28/2002   Influenza,inj,Quad PF,6+ Mos 02/20/2019, 03/10/2020   Influenza-Unspecified 02/19/2014, 02/13/2020, 02/19/2021, 03/01/2022   MMR 06/29/2003, 05/26/2004   PFIZER(Purple Top)SARS-COV-2 Vaccination 05/29/2019, 06/17/2019   Tdap 05/22/1996, 04/18/2005, 04/30/2012   There are no preventive care reminders to display for this patient.   4. Cervical cancer screening- due 2028 5. Breast cancer screening-  mammogram due, pt will call [place] mammogram 6. Colon cancer screening - advised to f/u 72yr 7. Skin cancer screening- advised regular sunscreen use. Denies worrisome, changing, or new skin lesions.  8. Birth control/STD check- N/A  9. Osteoporosis screening- N/A 10. Alcohol screening:  none 11. Smoking associated screening (lung cancer screening, AAA screen 65-75, UA)- non-smoker    Recommended follow up:  Return in about 4 months (around 10/20/2023) for med refills. No future appointments.   Lab/Order associations: fasting   Dulce Sellar, NP

## 2023-06-24 ENCOUNTER — Encounter: Payer: Self-pay | Admitting: Family

## 2023-06-24 MED ORDER — AMITRIPTYLINE HCL 75 MG PO TABS
75.0000 mg | ORAL_TABLET | Freq: Every day | ORAL | 1 refills | Status: DC
Start: 2023-06-24 — End: 2024-02-05
  Filled 2023-06-24 – 2023-08-30 (×2): qty 90, 90d supply, fill #0
  Filled 2023-11-28: qty 90, 90d supply, fill #1

## 2023-06-24 MED ORDER — MODAFINIL 100 MG PO TABS
100.0000 mg | ORAL_TABLET | Freq: Two times a day (BID) | ORAL | 1 refills | Status: DC
Start: 2023-06-25 — End: 2023-10-11
  Filled 2023-06-24 – 2023-08-30 (×2): qty 180, 90d supply, fill #0

## 2023-06-24 MED ORDER — METOPROLOL SUCCINATE ER 25 MG PO TB24
ORAL_TABLET | ORAL | 1 refills | Status: DC
Start: 2023-06-24 — End: 2024-01-04
  Filled 2023-06-24 – 2023-07-05 (×2): qty 135, 90d supply, fill #0
  Filled 2023-10-02: qty 135, 90d supply, fill #1

## 2023-06-24 MED ORDER — TRAMADOL HCL 50 MG PO TABS
50.0000 mg | ORAL_TABLET | Freq: Four times a day (QID) | ORAL | 3 refills | Status: DC | PRN
  Filled 2023-06-24 – 2023-07-05 (×2): qty 120, 30d supply, fill #0
  Filled 2023-07-31 – 2023-08-02 (×2): qty 120, 30d supply, fill #1
  Filled 2023-08-30: qty 120, 30d supply, fill #2
  Filled 2023-10-02: qty 120, 30d supply, fill #3

## 2023-06-24 MED ORDER — METHOCARBAMOL 750 MG PO TABS
750.0000 mg | ORAL_TABLET | Freq: Three times a day (TID) | ORAL | 1 refills | Status: DC | PRN
Start: 2023-06-24 — End: 2024-01-04
  Filled 2023-06-24 – 2023-07-25 (×2): qty 180, 60d supply, fill #0
  Filled 2023-08-30 – 2023-11-14 (×2): qty 180, 60d supply, fill #1

## 2023-06-24 NOTE — Assessment & Plan Note (Signed)
chronic stable on Metoprolol 25mg  qam, 12.5mg  at bedtime advised to flip doses for less fatigue during day sending refill f/u in 6 mos

## 2023-06-24 NOTE — Patient Instructions (Signed)
It was very nice to see you today!   I will review your lab results via MyChart in a few days.  I have sent in your med refills.  Schedule a 4 month follow up visit, ok if virtual.      PLEASE NOTE:  If you had any lab tests please let us know if you have not heard back within a few days. You may see your results on MyChart before we have a chance to review them but we will give you a call once they are reviewed by [place]. If we ordered any referrals today, please let us know if you have not heard from their office within the next week.

## 2023-06-24 NOTE — Assessment & Plan Note (Signed)
chronic, stable taking Tramadol 50 mg tid, Robaxin 750mg  q8h, Elavil 75mg  qhs. sending refills today f/u in 4 mos.

## 2023-06-24 NOTE — Assessment & Plan Note (Addendum)
Chronic, stable. Taking Elavil and Metoprolol as preventative and Imitrex for abortive. Refill sent earlier in month. F/U in 1 year or prn.

## 2023-06-25 ENCOUNTER — Other Ambulatory Visit: Payer: Self-pay

## 2023-06-25 ENCOUNTER — Other Ambulatory Visit (HOSPITAL_COMMUNITY): Payer: Self-pay

## 2023-07-05 ENCOUNTER — Other Ambulatory Visit (HOSPITAL_COMMUNITY): Payer: Self-pay

## 2023-07-05 ENCOUNTER — Other Ambulatory Visit: Payer: Self-pay

## 2023-07-25 ENCOUNTER — Other Ambulatory Visit (HOSPITAL_COMMUNITY): Payer: Self-pay

## 2023-07-31 ENCOUNTER — Other Ambulatory Visit (HOSPITAL_COMMUNITY): Payer: Self-pay

## 2023-08-02 ENCOUNTER — Encounter (HOSPITAL_COMMUNITY): Payer: Self-pay

## 2023-08-02 ENCOUNTER — Other Ambulatory Visit (HOSPITAL_COMMUNITY): Payer: Self-pay

## 2023-08-02 ENCOUNTER — Other Ambulatory Visit: Payer: Self-pay

## 2023-08-27 ENCOUNTER — Telehealth: Payer: Self-pay | Admitting: Family

## 2023-08-27 NOTE — Telephone Encounter (Signed)
 Spoke with patient regarding billing grievance.  Patient has transitioned from the [facility] plan to the [facility] plan.  Patient states her follow up for medication was due at the same time of her physical.  States she schedule her physical at the same time to complete both in one visit.    States Hudnell did ask if she was there was a CPE or a follow up and patient agreed to both.   Patient is concerned about being billed correctly for services rendered.  Believes follow up should be included in CPE.    I have submitted for coding review.

## 2023-08-30 ENCOUNTER — Other Ambulatory Visit (HOSPITAL_COMMUNITY): Payer: Self-pay

## 2023-08-30 ENCOUNTER — Other Ambulatory Visit: Payer: Self-pay

## 2023-08-31 ENCOUNTER — Other Ambulatory Visit (HOSPITAL_COMMUNITY): Payer: Self-pay

## 2023-08-31 ENCOUNTER — Other Ambulatory Visit: Payer: Self-pay

## 2023-10-02 ENCOUNTER — Other Ambulatory Visit (HOSPITAL_COMMUNITY): Payer: Self-pay

## 2023-10-02 ENCOUNTER — Other Ambulatory Visit: Payer: Self-pay

## 2023-10-03 ENCOUNTER — Encounter: Attending: Family | Admitting: Skilled Nursing Facility1

## 2023-10-03 ENCOUNTER — Encounter: Payer: Self-pay | Admitting: Skilled Nursing Facility1

## 2023-10-03 DIAGNOSIS — E631 Imbalance of constituents of food intake: Secondary | ICD-10-CM | POA: Insufficient documentation

## 2023-10-03 NOTE — Progress Notes (Signed)
 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 Pt states she believes she is sensitive to carbs because they make her sleepy. Pt states she is not a big meat eater. Pt states she has tried weight watchers and optivia and other diets in the past.  Pt states she has never had good energy. Pt states she has joined [employer]. Pt states she does have chronic pain.  Pt states she works from home.  Pt state she is all or nothing with her eating style.  Pt states she does a lot of protein bars and shakes.  Pt states her and her family travel often for her daughters cheer.   Estimated daily fluid intake:  oz Supplements:  Sleep:  Stress / self-care:  Current average weekly physical activity: ADL's  24-Hr Dietary Recall First Meal:  Snack:  Second Meal:  Snack:  Third Meal:  Snack:  Beverages:    NUTRITION INTERVENTION  Nutrition education (E-1) on the following topics:  Creation of balanced and diverse meals to increase the intake of nutrient-rich foods that provide essential vitamins, minerals, fiber, and phytonutrients Variety of Fruits and Vegetables: Aim for a colorful array of fruits and vegetables to ensure a wide range of nutrients. Include a mix of leafy greens, berries, citrus fruits, cruciferous vegetables, and more. Whole Grains: Choose whole grains over refined grains. Examples include brown rice, quinoa, oats, whole wheat, and barley. Lean Proteins: Include lean sources of protein, such as poultry, fish, tofu, legumes, beans, lentils, and low-fat dairy products. Limit red and processed meats. Healthy  Fats: Incorporate sources of healthy fats, including avocados, nuts, seeds, and olive oil. Limit saturated and trans fats found in fried and processed foods. Dairy or Dairy Alternatives: Choose low-fat or fat-free dairy products, or plant-based alternatives like almond or soy milk. Portion Control: Be mindful of portion sizes to avoid overeating. Pay attention to hunger and satisfaction cues. Limit Added Sugars: Minimize the consumption of sugary beverages, snacks, and desserts. Check food labels for added sugars and opt for natural sources of sweetness such as whole fruits. Hydration: Drink plenty of water throughout the day. Limit sugary drinks and excessive caffeine intake. Moderate Sodium Intake: Reduce the consumption of high-sodium foods. Use herbs and spices for flavor instead of excessive salt. Meal Planning and Preparation: Plan and prepare meals ahead of time to make healthier choices more convenient. Include a mix of food groups in each meal. Limit Processed Foods: Minimize the intake of highly processed and packaged foods that are often high in added sugars, salt, and unhealthy fats. Regular Physical Activity: Combine a healthy diet with regular physical activity for overall well-being. Aim for at least 150 minutes of moderate-intensity aerobic exercise per week, along with strength training. Moderation and Balance: Enjoy treats and indulgent foods in moderation, emphasizing balance rather than strict restriction.  Handouts Provided Include  Detailed MyPlate Micro/Macro  Learning Style & Readiness for Change Teaching method utilized: Visual & Auditory  Demonstrated degree of understanding via: Teach Back  Barriers to learning/adherence to lifestyle change:   Goals Established by Pt Try to focus on self care  MONITORING & EVALUATION Dietary intake, weekly physical activity  Next Steps  Patient is to call or email with any future questions or concerns.

## 2023-10-11 ENCOUNTER — Telehealth (INDEPENDENT_AMBULATORY_CARE_PROVIDER_SITE_OTHER): Payer: 59 | Admitting: Family

## 2023-10-11 ENCOUNTER — Encounter: Payer: Self-pay | Admitting: Family

## 2023-10-11 ENCOUNTER — Other Ambulatory Visit (HOSPITAL_COMMUNITY): Payer: Self-pay

## 2023-10-11 DIAGNOSIS — R400 Somnolence: Secondary | ICD-10-CM | POA: Diagnosis not present

## 2023-10-11 DIAGNOSIS — M797 Fibromyalgia: Secondary | ICD-10-CM

## 2023-10-11 MED ORDER — TRAMADOL HCL 50 MG PO TABS
50.0000 mg | ORAL_TABLET | Freq: Four times a day (QID) | ORAL | 3 refills | Status: DC | PRN
  Filled 2023-10-11 – 2023-11-14 (×2): qty 120, 30d supply, fill #0
  Filled 2023-12-11 – 2023-12-12 (×2): qty 120, 30d supply, fill #1
  Filled 2024-01-12: qty 120, 30d supply, fill #2

## 2023-10-11 MED ORDER — MODAFINIL 100 MG PO TABS
100.0000 mg | ORAL_TABLET | Freq: Two times a day (BID) | ORAL | 1 refills | Status: DC
Start: 2023-10-11 — End: 2024-02-05
  Filled 2023-10-11 – 2023-11-28 (×2): qty 180, 90d supply, fill #0

## 2023-10-11 NOTE — Progress Notes (Signed)
 MyChart Video Visit    Virtual Visit via Video Note   This format is felt to be most appropriate for this patient at this time. Physical exam was limited by quality of the video and audio technology used for the visit. CMA was able to get the patient set up on a video visit.  Patient location: Home. Patient and provider in visit Provider location: Office  I discussed the limitations of evaluation and management by telemedicine and the availability of in person appointments. The patient expressed understanding and agreed to proceed.  Visit Date: 10/11/2023  Today's healthcare provider: Versa Gore, NP     Subjective:   Patient ID: Karen Schwartz, female    DOB: 1970/10/29, 53 y.o.   MRN: [id number]  Chief Complaint  Patient presents with   Daytime somnolence  HPI: Fatigue:  persistent problem for over a year. Sleeps well most nights with Melatonin & Elavil . Reports she has to take a nap every day in the middle of the day or she can't stay awake with caffeine alone. Medications contributing are Metoprolol , Robaxin  & Tramadol . Modafanil started 05/2022, increased to 100mg  bid and pt reports this is helping her sx.  Pain: She reports chronic back pain. was not an injury that may have caused the pain. The pain started years ago and is staying constant. The pain does not radiate. The pain is described as aching, burning, tingling, soreness, stiffness, and throbbing, occurring every day. Symptoms are worse in the: all day & night.  Aggravating factors: sitting, standing, walking, and lifting, pulling. Relieving factors: medication Tramadol , Naproxen , Elavil , muscle relaxers and rest. She has tried application of heat, application of ice, acetaminophen, NSAIDs, topical anesthetics, physical therapy, and message therapy with little relief, reports she does get relief with therapeutic massage, but does not get more often d/t expense.   Assessment & Plan:  Daytime  somnolence Assessment & Plan: Chronic, stable, r/t fatigue, fibromyalgia taking Modafinil  100mg  bid continue to advise on drinking plenty of water,  minimize caffeine intake, increase exercise as able refill sent f/u 4 month   Orders: -     Modafinil ; Take 1 tablet (100 mg total) by mouth in the morning and at lunch time. Max 2 tablets daily  Dispense: 180 tablet; Refill: 1  Fibromyalgia Assessment & Plan: chronic, stable taking Tramadol  50 mg tid, Robaxin  750mg  q8h, Elavil  75mg  qhs. sending Tramadol  refill today f/u in 4 mos.   Orders: -     traMADol  HCl; Take 1 tablet by mouth every 6 hours as needed for severe pain. **Each refill must last 30 days.  Dispense: 120 tablet; Refill: 3    Past Medical History:  Diagnosis Date   Atopic dermatitis 12/06/2020   Bloating 02/11/2018   Chronic pain syndrome 12/06/2020   Chronic pain syndrome 12/06/2020   Chronic thoracic back pain (1ry area of Pain) (Bilateral) (L>R) 12/06/2020   Chronic upper back pain (Bilateral) 02/07/2021   Contact dermatitis and other eczema due to other chemical products 12/06/2020   Disorder of skeletal system 12/06/2020   Fibromyalgia    Hypertension    boderline   IBS (irritable bowel syndrome)    Other chronic allergic conjunctivitis 12/06/2020   -patient with periocular pruritis and epiphora but with very few nasal symptoms -will give trial of zaditor eye dropds prn  -given h/o severe atopic derm and periocular skin involvement cannot r/o atopic keratoconjunctivitis and thus will refer to derm   Pharmacologic therapy 12/06/2020   Problems influencing  health status 12/06/2020   Thoracic facet syndrome (Bilateral) 02/07/2021    No past surgical history on file.  Outpatient Medications Prior to Visit  Medication Sig Dispense Refill   amitriptyline  (ELAVIL ) 75 MG tablet Take 1 tablet (75 mg total) by mouth at bedtime. 90 tablet 1   Cholecalciferol (VITAMIN D ) 125 MCG (5000 UT) CAPS Take by mouth.     melatonin 5  MG TABS Take 5 mg by mouth at bedtime.     methocarbamol  (ROBAXIN ) 750 MG tablet Take 1 tablet (750 mg total) by mouth every 8 (eight) hours as needed for muscle spasms. 180 tablet 1   metoprolol  succinate (TOPROL -XL) 25 MG 24 hr tablet Take 1 tablet (25 mg total) by mouth every morning AND 0.5 tablets (12.5 mg total) every evening. 135 tablet 1   Multiple Vitamin (MULTIVITAMIN) tablet Take 1 tablet by mouth daily.     NAPROXEN  PO Take by mouth as needed.     polyethylene glycol (MIRALAX  / GLYCOLAX ) packet Take 17 g by mouth daily.     Sodium Fluoride  (SODIUM FLUORIDE  5000 PPM) 1.1 % PSTE Apply to teeth once daily in the evening 100 mL 12   SUMAtriptan  (IMITREX ) 100 MG tablet TAKE 1 TABLET BY MOUTH EVERY 2 HOURS AS NEEDED AS DIRECTED 9 tablet 3   modafinil  (PROVIGIL ) 100 MG tablet Take 1 tablet (100 mg total) by mouth in the morning and at lunch time. Max 2 tablets daily 180 tablet 1   traMADol  (ULTRAM ) 50 MG tablet Take 1 tablet by mouth every 6 hours as needed for severe pain. **Each refill must last 30 days. 120 tablet 3   No facility-administered medications prior to visit.    Allergies  Allergen Reactions   Ampicillin    Cymbalta [Duloxetine Hcl]    Latex Itching   Lyrica [Pregabalin]    Zantac [Ranitidine Hcl]     Tachycardia      Objective:   Physical Exam Vitals and nursing note reviewed.  Constitutional:      General: Pt is not in acute distress.    Appearance: Normal appearance.  HENT:     Head: Normocephalic.  Pulmonary:     Effort: No respiratory distress.  Musculoskeletal:     Cervical back: Normal range of motion.  Skin:    General: Skin is dry.     Coloration: Skin is not pale.  Neurological:     Mental Status: Pt is alert and oriented to person, place, and time.  Psychiatric:        Mood and Affect: Mood normal.   LMP 09/10/2023 (Approximate)   Wt Readings from Last 3 Encounters:  06/22/23 145 lb 9.6 oz (66 kg)  02/02/23 149 lb 3.2 oz (67.7 kg)   09/27/22 149 lb (67.6 kg)      I discussed the assessment and treatment plan with the patient. The patient was provided an opportunity to ask questions and all were answered. The patient agreed with the plan and demonstrated an understanding of the instructions.   The patient was advised to call back or seek an in-person evaluation if the symptoms worsen or if the condition fails to improve as anticipated.  Versa Gore, NP [facility] [phone] (phone) [phone] (fax)  [facility]

## 2023-10-11 NOTE — Assessment & Plan Note (Signed)
 Chronic, stable, r/t fatigue, fibromyalgia taking Modafinil  100mg  bid continue to advise on drinking plenty of water,  minimize caffeine intake, increase exercise as able refill sent f/u 4 month

## 2023-10-11 NOTE — Assessment & Plan Note (Signed)
 chronic, stable taking Tramadol  50 mg tid, Robaxin  750mg  q8h, Elavil  75mg  qhs. sending Tramadol  refill today f/u in 4 mos.

## 2023-10-23 ENCOUNTER — Encounter: Attending: Family | Admitting: Skilled Nursing Facility1

## 2023-10-23 DIAGNOSIS — E631 Imbalance of constituents of food intake: Secondary | ICD-10-CM

## 2023-10-23 NOTE — Progress Notes (Signed)
 Medical Nutrition Therapy  Appointment Start time:  1:55  Appointment End time:  2:55  Primary concerns today: general health and weight loss   Referral diagnosis: [employer] [place] employee   NUTRITION ASSESSMENT    Clinical Medical Hx:  Medications:  Labs:  Notable Signs/Symptoms: chronic pain  Lifestyle & Dietary Hx  Pt states she wants to know how she should eat for grandma activity like walking on the treadmill at a slow pace for 2-3 days a week.  Pt states she does not like any starches or other carbs but will eat fruit.  Pt states she gets really tired after activity.   Estimated daily fluid intake:  oz Supplements:  Sleep:  Stress / self-care:  Current average weekly physical activity: ADL's  24-Hr Dietary Recall First Meal:  Snack:  Second Meal:  Snack:  Third Meal:  Snack:  Beverages:    NUTRITION INTERVENTION  Nutrition education (E-1) on the following topics:  Why you need complex carbohydrates: Whole grains and other complex carbohydrates are required to have a healthy diet. Whole grains provide fiber which can help with blood glucose levels and help keep you satiated. Fruits and starchy vegetables provide essential vitamins and minerals required for immune function, eyesight support, brain support, bone density, wound healing and many other functions within the body. According to the current evidenced based 2020-2025 Dietary Guidelines for Americans, complex carbohydrates are part of a healthy eating pattern which is associated with a decreased risk for type 2 diabetes, cancers, and cardiovascular disease.  -appropriate carbohydrate consumption reduces risk of muscular injury/improves tissue repair and increases growth and development    Handouts Previously Provided Include  Detailed MyPlate Micro/Macro  Learning Style & Readiness for Change Teaching method utilized: Visual & Auditory  Demonstrated degree of understanding via: Teach Back  Barriers to  learning/adherence to lifestyle change:   Goals Established by Pt Try a banana after your treadmill walks    MONITORING & EVALUATION Dietary intake, weekly physical activity  Next Steps  Patient is to call or email with any future questions or concerns.

## 2023-11-14 ENCOUNTER — Other Ambulatory Visit: Payer: Self-pay

## 2023-11-28 ENCOUNTER — Other Ambulatory Visit: Payer: Self-pay | Admitting: Family

## 2023-11-28 DIAGNOSIS — G43709 Chronic migraine without aura, not intractable, without status migrainosus: Secondary | ICD-10-CM

## 2023-11-29 ENCOUNTER — Other Ambulatory Visit (HOSPITAL_COMMUNITY): Payer: Self-pay

## 2023-11-29 ENCOUNTER — Other Ambulatory Visit: Payer: Self-pay

## 2023-11-29 MED ORDER — SUMATRIPTAN SUCCINATE 100 MG PO TABS
ORAL_TABLET | ORAL | 3 refills | Status: AC
Start: 2023-11-29 — End: ?
  Filled 2023-11-29: qty 9, 30d supply, fill #0
  Filled 2024-01-12: qty 9, 30d supply, fill #1

## 2023-12-11 ENCOUNTER — Other Ambulatory Visit (HOSPITAL_COMMUNITY): Payer: Self-pay

## 2023-12-12 ENCOUNTER — Other Ambulatory Visit (HOSPITAL_COMMUNITY): Payer: Self-pay

## 2023-12-12 ENCOUNTER — Other Ambulatory Visit: Payer: Self-pay

## 2024-01-01 DIAGNOSIS — H52223 Regular astigmatism, bilateral: Secondary | ICD-10-CM | POA: Diagnosis not present

## 2024-01-01 DIAGNOSIS — H5213 Myopia, bilateral: Secondary | ICD-10-CM | POA: Diagnosis not present

## 2024-01-01 DIAGNOSIS — H524 Presbyopia: Secondary | ICD-10-CM | POA: Diagnosis not present

## 2024-01-04 ENCOUNTER — Other Ambulatory Visit: Payer: Self-pay

## 2024-01-04 ENCOUNTER — Other Ambulatory Visit: Payer: Self-pay | Admitting: Family

## 2024-01-04 ENCOUNTER — Other Ambulatory Visit (HOSPITAL_COMMUNITY): Payer: Self-pay

## 2024-01-04 DIAGNOSIS — R000 Tachycardia, unspecified: Secondary | ICD-10-CM

## 2024-01-04 DIAGNOSIS — M797 Fibromyalgia: Secondary | ICD-10-CM

## 2024-01-04 MED ORDER — METHOCARBAMOL 750 MG PO TABS
750.0000 mg | ORAL_TABLET | Freq: Three times a day (TID) | ORAL | 1 refills | Status: DC | PRN
Start: 2024-01-04 — End: 2024-02-05
  Filled 2024-01-04: qty 180, 60d supply, fill #0

## 2024-01-04 MED ORDER — METOPROLOL SUCCINATE ER 25 MG PO TB24
ORAL_TABLET | ORAL | 1 refills | Status: DC
Start: 2024-01-04 — End: 2024-02-05
  Filled 2024-01-04: qty 135, 90d supply, fill #0

## 2024-01-12 ENCOUNTER — Other Ambulatory Visit (HOSPITAL_COMMUNITY): Payer: Self-pay

## 2024-01-14 ENCOUNTER — Other Ambulatory Visit: Payer: Self-pay

## 2024-01-14 ENCOUNTER — Other Ambulatory Visit (HOSPITAL_COMMUNITY): Payer: Self-pay

## 2024-01-15 ENCOUNTER — Other Ambulatory Visit: Payer: Self-pay

## 2024-02-04 NOTE — Progress Notes (Unsigned)
 Patient ID: Karen Schwartz, female    DOB: 1970-07-02, 53 y.o.   MRN: [id number]  No chief complaint on file.        Assessment & Plan:   Subjective:    Outpatient Medications Prior to Visit  Medication Sig Dispense Refill   amitriptyline  (ELAVIL ) 75 MG tablet Take 1 tablet (75 mg total) by mouth at bedtime. 90 tablet 1   Cholecalciferol (VITAMIN D ) 125 MCG (5000 UT) CAPS Take by mouth.     melatonin 5 MG TABS Take 5 mg by mouth at bedtime.     methocarbamol  (ROBAXIN ) 750 MG tablet Take 1 tablet (750 mg total) by mouth every 8 (eight) hours as needed for muscle spasms. 180 tablet 1   metoprolol  succinate (TOPROL -XL) 25 MG 24 hr tablet Take 1 tablet (25 mg total) by mouth every morning AND 0.5 tablets (12.5 mg total) every evening. 135 tablet 1   modafinil  (PROVIGIL ) 100 MG tablet Take 1 tablet (100 mg total) by mouth in the morning and at lunch time. Max 2 tablets daily 180 tablet 1   Multiple Vitamin (MULTIVITAMIN) tablet Take 1 tablet by mouth daily.     NAPROXEN  PO Take by mouth as needed.     polyethylene glycol (MIRALAX  / GLYCOLAX ) packet Take 17 g by mouth daily.     Sodium Fluoride  (SODIUM FLUORIDE  5000 PPM) 1.1 % PSTE Apply to teeth once daily in the evening 100 mL 12   SUMAtriptan  (IMITREX ) 100 MG tablet TAKE 1 TABLET BY MOUTH EVERY 2 HOURS AS NEEDED AS DIRECTED 9 tablet 3   traMADol  (ULTRAM ) 50 MG tablet Take 1 tablet by mouth every 6 hours as needed for severe pain. **Each refill must last 30 days. 120 tablet 3   No facility-administered medications prior to visit.   Past Medical History:  Diagnosis Date   Atopic dermatitis 12/06/2020   Bloating 02/11/2018   Chronic pain syndrome 12/06/2020   Chronic pain syndrome 12/06/2020   Chronic thoracic back pain (1ry area of Pain) (Bilateral) (L>R) 12/06/2020   Chronic upper back pain (Bilateral) 02/07/2021   Contact dermatitis and other eczema due to other chemical products 12/06/2020   Disorder of skeletal system 12/06/2020    Fibromyalgia    Hypertension    boderline   IBS (irritable bowel syndrome)    Other chronic allergic conjunctivitis 12/06/2020   -patient with periocular pruritis and epiphora but with very few nasal symptoms -will give trial of zaditor eye dropds prn  -given h/o severe atopic derm and periocular skin involvement cannot r/o atopic keratoconjunctivitis and thus will refer to derm   Pharmacologic therapy 12/06/2020   Problems influencing health status 12/06/2020   Thoracic facet syndrome (Bilateral) 02/07/2021   No past surgical history on file. Allergies  Allergen Reactions   Ampicillin    Cymbalta [Duloxetine Hcl]    Latex Itching   Lyrica [Pregabalin]    Zantac [Ranitidine Hcl]     Tachycardia      Objective:    Physical Exam Vitals and nursing note reviewed.  Constitutional:      Appearance: Normal appearance.  Cardiovascular:     Rate and Rhythm: Normal rate and regular rhythm.  Pulmonary:     Effort: Pulmonary effort is normal.     Breath sounds: Normal breath sounds.  Musculoskeletal:        General: Normal range of motion.  Skin:    General: Skin is warm and dry.  Neurological:     Mental Status:  She is alert.  Psychiatric:        Mood and Affect: Mood normal.        Behavior: Behavior normal.    There were no vitals taken for this visit. Wt Readings from Last 3 Encounters:  06/22/23 145 lb 9.6 oz (66 kg)  02/02/23 149 lb 3.2 oz (67.7 kg)  09/27/22 149 lb (67.6 kg)       Lucius Krabbe, NP

## 2024-02-05 ENCOUNTER — Ambulatory Visit: Admitting: Family

## 2024-02-05 ENCOUNTER — Encounter: Payer: Self-pay | Admitting: Family

## 2024-02-05 ENCOUNTER — Other Ambulatory Visit (HOSPITAL_COMMUNITY): Payer: Self-pay

## 2024-02-05 VITALS — BP 111/79 | HR 94 | Temp 98.2°F | Ht 62.0 in | Wt 143.4 lb

## 2024-02-05 DIAGNOSIS — R000 Tachycardia, unspecified: Secondary | ICD-10-CM | POA: Diagnosis not present

## 2024-02-05 DIAGNOSIS — R400 Somnolence: Secondary | ICD-10-CM

## 2024-02-05 DIAGNOSIS — M47814 Spondylosis without myelopathy or radiculopathy, thoracic region: Secondary | ICD-10-CM | POA: Diagnosis not present

## 2024-02-05 DIAGNOSIS — M797 Fibromyalgia: Secondary | ICD-10-CM

## 2024-02-05 DIAGNOSIS — G43709 Chronic migraine without aura, not intractable, without status migrainosus: Secondary | ICD-10-CM

## 2024-02-05 MED ORDER — AMITRIPTYLINE HCL 75 MG PO TABS
75.0000 mg | ORAL_TABLET | Freq: Every day | ORAL | 1 refills | Status: DC
  Filled 2024-02-05 – 2024-02-24 (×2): qty 90, 90d supply, fill #0
  Filled 2024-05-22 – 2024-05-23 (×2): qty 90, 90d supply, fill #1

## 2024-02-05 MED ORDER — METHOCARBAMOL 750 MG PO TABS
750.0000 mg | ORAL_TABLET | Freq: Three times a day (TID) | ORAL | 1 refills | Status: DC | PRN
  Filled 2024-02-05 – 2024-04-02 (×2): qty 180, 60d supply, fill #0

## 2024-02-05 MED ORDER — TRAMADOL HCL 50 MG PO TABS
50.0000 mg | ORAL_TABLET | Freq: Four times a day (QID) | ORAL | 3 refills | Status: DC | PRN
  Filled 2024-02-05 – 2024-02-13 (×2): qty 120, 30d supply, fill #0
  Filled 2024-03-14: qty 120, 30d supply, fill #1
  Filled 2024-04-12: qty 120, 30d supply, fill #2
  Filled 2024-05-12 – 2024-05-13 (×2): qty 120, 30d supply, fill #3

## 2024-02-05 MED ORDER — METOPROLOL SUCCINATE ER 25 MG PO TB24
ORAL_TABLET | ORAL | 1 refills | Status: DC
  Filled 2024-02-05 – 2024-04-02 (×2): qty 135, 90d supply, fill #0

## 2024-02-05 MED ORDER — MODAFINIL 100 MG PO TABS
100.0000 mg | ORAL_TABLET | Freq: Two times a day (BID) | ORAL | 1 refills | Status: DC
  Filled 2024-02-05 – 2024-02-27 (×2): qty 180, 90d supply, fill #0
  Filled 2024-05-22 – 2024-05-26 (×2): qty 180, 90d supply, fill #1

## 2024-02-05 NOTE — Assessment & Plan Note (Addendum)
 Chronic, stable. Taking Elavil  and Metoprolol  as preventative and Imitrex  for abortive. Refilling Elavil  & Metoprolol  today.  F/U in 4 mos

## 2024-02-05 NOTE — Assessment & Plan Note (Signed)
 Chronic, stable, r/t fatigue, fibromyalgia taking Modafinil  100mg  bid continue to advise on drinking plenty of water,  minimize caffeine intake, increase exercise as able refill sent f/u 4 month

## 2024-02-05 NOTE — Assessment & Plan Note (Signed)
 chronic, stable taking Tramadol  50 mg tid, Robaxin  750mg  q8h, Elavil  75mg  qhs. sending Tramadol , Elavil  & Robaxin  refills today f/u in 4 mos.

## 2024-02-05 NOTE — Assessment & Plan Note (Signed)
 chronic stable on Metoprolol  12.5mg  qam, 25mg  at bedtime sending refill f/u in 4 mos

## 2024-02-06 ENCOUNTER — Other Ambulatory Visit (HOSPITAL_COMMUNITY): Payer: Self-pay

## 2024-02-13 ENCOUNTER — Other Ambulatory Visit: Payer: Self-pay

## 2024-02-13 ENCOUNTER — Other Ambulatory Visit (HOSPITAL_COMMUNITY): Payer: Self-pay

## 2024-02-24 ENCOUNTER — Other Ambulatory Visit (HOSPITAL_COMMUNITY): Payer: Self-pay

## 2024-02-25 ENCOUNTER — Other Ambulatory Visit: Payer: Self-pay

## 2024-02-25 ENCOUNTER — Other Ambulatory Visit (HOSPITAL_COMMUNITY): Payer: Self-pay

## 2024-02-27 ENCOUNTER — Other Ambulatory Visit (HOSPITAL_COMMUNITY): Payer: Self-pay

## 2024-02-28 ENCOUNTER — Other Ambulatory Visit: Payer: Self-pay

## 2024-03-14 ENCOUNTER — Other Ambulatory Visit (HOSPITAL_COMMUNITY): Payer: Self-pay

## 2024-03-14 ENCOUNTER — Other Ambulatory Visit: Payer: Self-pay

## 2024-04-02 ENCOUNTER — Other Ambulatory Visit (HOSPITAL_COMMUNITY): Payer: Self-pay

## 2024-04-02 ENCOUNTER — Other Ambulatory Visit: Payer: Self-pay

## 2024-04-07 ENCOUNTER — Other Ambulatory Visit (HOSPITAL_COMMUNITY): Payer: Self-pay

## 2024-04-14 ENCOUNTER — Other Ambulatory Visit (HOSPITAL_COMMUNITY): Payer: Self-pay

## 2024-04-14 ENCOUNTER — Other Ambulatory Visit: Payer: Self-pay

## 2024-04-15 ENCOUNTER — Other Ambulatory Visit (HOSPITAL_COMMUNITY): Payer: Self-pay

## 2024-04-15 ENCOUNTER — Other Ambulatory Visit: Payer: Self-pay

## 2024-05-12 ENCOUNTER — Other Ambulatory Visit (HOSPITAL_COMMUNITY): Payer: Self-pay

## 2024-05-13 ENCOUNTER — Other Ambulatory Visit: Payer: Self-pay

## 2024-05-23 ENCOUNTER — Other Ambulatory Visit: Payer: Self-pay

## 2024-05-23 ENCOUNTER — Other Ambulatory Visit (HOSPITAL_COMMUNITY): Payer: Self-pay

## 2024-05-26 ENCOUNTER — Other Ambulatory Visit: Payer: Self-pay

## 2024-05-26 ENCOUNTER — Other Ambulatory Visit (HOSPITAL_COMMUNITY): Payer: Self-pay

## 2024-06-10 ENCOUNTER — Other Ambulatory Visit: Payer: Self-pay | Admitting: Family

## 2024-06-10 ENCOUNTER — Other Ambulatory Visit (HOSPITAL_COMMUNITY): Payer: Self-pay

## 2024-06-10 DIAGNOSIS — M797 Fibromyalgia: Secondary | ICD-10-CM

## 2024-06-11 ENCOUNTER — Other Ambulatory Visit (HOSPITAL_BASED_OUTPATIENT_CLINIC_OR_DEPARTMENT_OTHER): Payer: Self-pay

## 2024-06-11 MED ORDER — TRAMADOL HCL 50 MG PO TABS
50.0000 mg | ORAL_TABLET | Freq: Four times a day (QID) | ORAL | 0 refills | Status: DC | PRN
  Filled 2024-06-11: qty 120, 30d supply, fill #0

## 2024-06-11 NOTE — Telephone Encounter (Signed)
 Last OV: 02/05/2024  Next OV:  06/12/2024  Last Refill: 02/05/2024  Dispensed: 30/0

## 2024-06-12 ENCOUNTER — Encounter (HOSPITAL_COMMUNITY): Payer: Self-pay

## 2024-06-12 ENCOUNTER — Telehealth: Admitting: Family

## 2024-06-12 ENCOUNTER — Other Ambulatory Visit (HOSPITAL_BASED_OUTPATIENT_CLINIC_OR_DEPARTMENT_OTHER): Payer: Self-pay

## 2024-06-12 ENCOUNTER — Other Ambulatory Visit: Payer: Self-pay

## 2024-06-12 ENCOUNTER — Other Ambulatory Visit (HOSPITAL_COMMUNITY): Payer: Self-pay

## 2024-06-12 DIAGNOSIS — R400 Somnolence: Secondary | ICD-10-CM

## 2024-06-12 DIAGNOSIS — M797 Fibromyalgia: Secondary | ICD-10-CM

## 2024-06-12 DIAGNOSIS — R000 Tachycardia, unspecified: Secondary | ICD-10-CM | POA: Diagnosis not present

## 2024-06-12 MED ORDER — MODAFINIL 100 MG PO TABS
100.0000 mg | ORAL_TABLET | Freq: Two times a day (BID) | ORAL | 1 refills | Status: AC
  Filled 2024-06-12: qty 180, 90d supply, fill #0

## 2024-06-12 MED ORDER — AMITRIPTYLINE HCL 75 MG PO TABS
75.0000 mg | ORAL_TABLET | Freq: Every day | ORAL | 1 refills | Status: AC
  Filled 2024-06-12: qty 90, 90d supply, fill #0

## 2024-06-12 MED ORDER — METHOCARBAMOL 750 MG PO TABS
750.0000 mg | ORAL_TABLET | Freq: Three times a day (TID) | ORAL | 1 refills | Status: AC | PRN
  Filled 2024-06-12: qty 180, 60d supply, fill #0

## 2024-06-12 MED ORDER — TRAMADOL HCL 50 MG PO TABS
50.0000 mg | ORAL_TABLET | Freq: Four times a day (QID) | ORAL | 3 refills | Status: AC | PRN
  Filled 2024-06-12: qty 120, 30d supply, fill #0

## 2024-06-12 MED ORDER — METOPROLOL SUCCINATE ER 25 MG PO TB24
ORAL_TABLET | ORAL | 1 refills | Status: AC
  Filled 2024-06-12: qty 135, 90d supply, fill #0

## 2024-06-12 NOTE — Progress Notes (Signed)
 "   MyChart Video Visit    Virtual Visit via Video Note   This format is felt to be most appropriate for this patient at this time. Physical exam was limited by quality of the video and audio technology used for the visit. CMA was able to get the patient set up on a video visit.  Patient location: Home. Patient and provider in visit Provider location: Office  I discussed the limitations of evaluation and management by telemedicine and the availability of in person appointments. The patient expressed understanding and agreed to proceed.  Visit Date: 06/12/2024  Today's healthcare provider: Lucius Krabbe, NP     Subjective:   Patient ID: Karen Schwartz, female    DOB: 1971/01/17, 54 y.o.   MRN: [id number]  Chief Complaint  Patient presents with   Fibromyalgia   Daytime somnolence   Chronic migraine without aura without status migrainosus, n  Discussed the use of AI scribe software for clinical note transcription with the patient, who gave verbal consent to proceed.  History of Present Illness Karen Schwartz is a 54 year old female with fibromyalgia and migraines who presents for medication management.  She takes modafinil  100 mg twice daily for daytime insomnolence but has bothersome dry mouth and wants to stop it. She uses tramadol  50 mg four times daily for fibromyalgia. Different brands affect her, with some causing more tiredness. She prefers the Teva brand, which she finds more effective and less sedating. She takes amitriptyline  75 mg at bedtime for fibromyalgia and migraines without problems. She uses methocarbamol  750 mg every eight hours, typically twice daily, for fibromyalgia and thoracic spine spondylosis without adverse effects. She takes metoprolol  25 mg in the morning and 12.5 mg in the evening for tachycardia and has no concerns with it.  Assessment & Plan Fibromyalgia & thoracic spondylosis Chronic condition managed with medications. Tramadol  brand  change improved fatigue. - Continue tramadol  50 mg four times a day, refill sent. - Continue amitriptyline  75 mg at bedtime, refill sent. - Continue methocarbamol  750 mg twice a day, refill sent. - Request Teva brand tramadol  to avoid fatigue. - F/U in 4 mos  Daytime somnolence Managed with modafinil , effective despite dry mouth. - Continue modafinil  100 mg twice a day, refill sent. - F/U in 4 mos  Tachycardia Managed with metoprolol , current regimen effective. - Continue metoprolol  25 mg in the morning and 12.5 mg in the evening, sending refill. - F/U in 4 mos  Migraines - Continue amitriptyline  75 mg at bedtime for prevention, sending refill. - Sumatriptan  for acute migraines, no refill needed today. - F/U in 4 mos  Past Medical History:  Diagnosis Date   Atopic dermatitis 12/06/2020   Bloating 02/11/2018   Chronic fatigue 08/26/2021   Chronic pain syndrome 12/06/2020   Chronic pain syndrome 12/06/2020   Chronic thoracic back pain (1ry area of Pain) (Bilateral) (L>R) 12/06/2020   Chronic upper back pain (Bilateral) 02/07/2021   Contact dermatitis and other eczema due to other chemical products 12/06/2020   Disorder of skeletal system 12/06/2020   Fibromyalgia    Hypertension    boderline   IBS (irritable bowel syndrome)    Other chronic allergic conjunctivitis 12/06/2020   -patient with periocular pruritis and epiphora but with very few nasal symptoms -will give trial of zaditor eye dropds prn  -given h/o severe atopic derm and periocular skin involvement cannot r/o atopic keratoconjunctivitis and thus will refer to derm   Pharmacologic therapy 12/06/2020   Problems  influencing health status 12/06/2020   Thoracic facet syndrome (Bilateral) 02/07/2021    History reviewed. No pertinent surgical history.  Outpatient Medications Prior to Visit  Medication Sig Dispense Refill   Cholecalciferol (VITAMIN D ) 125 MCG (5000 UT) CAPS Take by mouth.     melatonin 5 MG TABS Take  5 mg by mouth at bedtime.     Multiple Vitamin (MULTIVITAMIN) tablet Take 1 tablet by mouth daily.     NAPROXEN  PO Take by mouth as needed.     polyethylene glycol (MIRALAX  / GLYCOLAX ) packet Take 17 g by mouth daily.     Sodium Fluoride  (SODIUM FLUORIDE  5000 PPM) 1.1 % PSTE Apply to teeth once daily in the evening 100 mL 12   SUMAtriptan  (IMITREX ) 100 MG tablet TAKE 1 TABLET BY MOUTH EVERY 2 HOURS AS NEEDED AS DIRECTED 9 tablet 3   amitriptyline  (ELAVIL ) 75 MG tablet Take 1 tablet (75 mg total) by mouth at bedtime. 90 tablet 1   methocarbamol  (ROBAXIN ) 750 MG tablet Take 1 tablet (750 mg total) by mouth every 8 (eight) hours as needed for muscle spasms. 180 tablet 1   metoprolol  succinate (TOPROL -XL) 25 MG 24 hr tablet Take 1 tablet (25 mg total) by mouth every morning AND 0.5 tablets (12.5 mg total) every evening. 135 tablet 1   modafinil  (PROVIGIL ) 100 MG tablet Take 1 tablet (100 mg total) by mouth 2 (two) times daily with breakfast and lunch. 180 tablet 1   traMADol  (ULTRAM ) 50 MG tablet Take 1 tablet (50 mg total) by mouth every 6 (six) hours as needed for severe pain (pain score 7-10). 120 tablet 0   No facility-administered medications prior to visit.    Allergies[1]     Objective:   Physical Exam Vitals and nursing note reviewed.  Constitutional:      General: Pt is not in acute distress.    Appearance: Normal appearance.  HENT:     Head: Normocephalic.  Pulmonary:     Effort: No respiratory distress.  Musculoskeletal:     Cervical back: Normal range of motion.  Skin:    General: Skin is dry.     Coloration: Skin is not pale.  Neurological:     Mental Status: Pt is alert and oriented to person, place, and time.  Psychiatric:        Mood and Affect: Mood normal.   There were no vitals taken for this visit.  Wt Readings from Last 3 Encounters:  02/05/24 143 lb 6.4 oz (65 kg)  06/22/23 145 lb 9.6 oz (66 kg)  02/02/23 149 lb 3.2 oz (67.7 kg)     I discussed the  assessment and treatment plan with the patient. The patient was provided an opportunity to ask questions and all were answered. The patient agreed with the plan and demonstrated an understanding of the instructions.   The patient was advised to call back or seek an in-person evaluation if the symptoms worsen or if the condition fails to improve as anticipated.  Lucius Krabbe, NP [facility] [phone] (phone) [phone] (fax)  [facility]      [1]  Allergies Allergen Reactions   Ampicillin    Cymbalta [Duloxetine Hcl]    Latex Itching   Lyrica [Pregabalin]    Zantac [Ranitidine Hcl]     Tachycardia   "

## 2024-06-13 ENCOUNTER — Other Ambulatory Visit (HOSPITAL_COMMUNITY): Payer: Self-pay

## 2024-06-22 ENCOUNTER — Other Ambulatory Visit: Payer: Self-pay

## 2024-06-22 ENCOUNTER — Other Ambulatory Visit (HOSPITAL_BASED_OUTPATIENT_CLINIC_OR_DEPARTMENT_OTHER): Payer: Self-pay

## 2024-06-24 ENCOUNTER — Other Ambulatory Visit (HOSPITAL_COMMUNITY): Payer: Self-pay
